# Patient Record
Sex: Female | Born: 1950
Health system: Southern US, Community
[De-identification: ages and names within clinical notes are randomized; demographics above are authoritative.]

## PROBLEM LIST (undated history)

## (undated) DIAGNOSIS — R112 Nausea with vomiting, unspecified: Secondary | ICD-10-CM

## (undated) DIAGNOSIS — G4700 Insomnia, unspecified: Secondary | ICD-10-CM

## (undated) DIAGNOSIS — E049 Nontoxic goiter, unspecified: Secondary | ICD-10-CM

## (undated) DIAGNOSIS — D649 Anemia, unspecified: Secondary | ICD-10-CM

## (undated) DIAGNOSIS — H409 Unspecified glaucoma: Secondary | ICD-10-CM

## (undated) DIAGNOSIS — K8020 Calculus of gallbladder without cholecystitis without obstruction: Secondary | ICD-10-CM

## (undated) DIAGNOSIS — A048 Other specified bacterial intestinal infections: Secondary | ICD-10-CM

## (undated) DIAGNOSIS — N6310 Unspecified lump in the right breast, unspecified quadrant: Secondary | ICD-10-CM

## (undated) DIAGNOSIS — L709 Acne, unspecified: Secondary | ICD-10-CM

## (undated) DIAGNOSIS — K219 Gastro-esophageal reflux disease without esophagitis: Secondary | ICD-10-CM

## (undated) DIAGNOSIS — E119 Type 2 diabetes mellitus without complications: Secondary | ICD-10-CM

## (undated) DIAGNOSIS — T8859XA Other complications of anesthesia, initial encounter: Secondary | ICD-10-CM

## (undated) DIAGNOSIS — M1990 Unspecified osteoarthritis, unspecified site: Secondary | ICD-10-CM

## (undated) DIAGNOSIS — H6992 Unspecified Eustachian tube disorder, left ear: Secondary | ICD-10-CM

## (undated) DIAGNOSIS — J189 Pneumonia, unspecified organism: Secondary | ICD-10-CM

## (undated) DIAGNOSIS — E7800 Pure hypercholesterolemia, unspecified: Secondary | ICD-10-CM

## (undated) DIAGNOSIS — R011 Cardiac murmur, unspecified: Secondary | ICD-10-CM

## (undated) DIAGNOSIS — K5790 Diverticulosis of intestine, part unspecified, without perforation or abscess without bleeding: Secondary | ICD-10-CM

## (undated) DIAGNOSIS — F32A Depression, unspecified: Secondary | ICD-10-CM

## (undated) DIAGNOSIS — E039 Hypothyroidism, unspecified: Secondary | ICD-10-CM

## (undated) DIAGNOSIS — H269 Unspecified cataract: Secondary | ICD-10-CM

## (undated) DIAGNOSIS — Z9889 Other specified postprocedural states: Secondary | ICD-10-CM

## (undated) DIAGNOSIS — I10 Essential (primary) hypertension: Secondary | ICD-10-CM

## (undated) DIAGNOSIS — F329 Major depressive disorder, single episode, unspecified: Secondary | ICD-10-CM

## (undated) DIAGNOSIS — T7840XA Allergy, unspecified, initial encounter: Secondary | ICD-10-CM

## (undated) HISTORY — DX: Major depressive disorder, single episode, unspecified: F32.9

## (undated) HISTORY — PX: BREAST LUMPECTOMY: SHX2

## (undated) HISTORY — PX: TYMPANOPLASTY: SHX33

## (undated) HISTORY — DX: Unspecified glaucoma: H40.9

## (undated) HISTORY — DX: Diverticulosis of intestine, part unspecified, without perforation or abscess without bleeding: K57.90

## (undated) HISTORY — PX: EYE SURGERY: SHX253

## (undated) HISTORY — DX: Unspecified lump in the right breast, unspecified quadrant: N63.10

## (undated) HISTORY — PX: OTHER SURGICAL HISTORY: SHX169

## (undated) HISTORY — PX: UPPER GI ENDOSCOPY: SHX6162

## (undated) HISTORY — DX: Acne, unspecified: L70.9

## (undated) HISTORY — DX: Allergy, unspecified, initial encounter: T78.40XA

## (undated) HISTORY — DX: Unspecified cataract: H26.9

## (undated) HISTORY — DX: Essential (primary) hypertension: I10

## (undated) HISTORY — DX: Calculus of gallbladder without cholecystitis without obstruction: K80.20

## (undated) HISTORY — DX: Hypothyroidism, unspecified: E03.9

## (undated) HISTORY — DX: Depression, unspecified: F32.A

## (undated) HISTORY — DX: Cardiac murmur, unspecified: R01.1

## (undated) HISTORY — PX: ABDOMINAL HYSTERECTOMY: SHX81

## (undated) HISTORY — DX: Other specified bacterial intestinal infections: A04.8

---

## 1976-10-09 HISTORY — PX: OTHER SURGICAL HISTORY: SHX169

## 1997-10-30 ENCOUNTER — Other Ambulatory Visit: Admission: RE | Admit: 1997-10-30 | Discharge: 1997-10-30 | Payer: Self-pay | Admitting: Obstetrics & Gynecology

## 2000-04-13 ENCOUNTER — Other Ambulatory Visit: Admission: RE | Admit: 2000-04-13 | Discharge: 2000-04-13 | Payer: Self-pay | Admitting: Obstetrics and Gynecology

## 2001-07-16 ENCOUNTER — Other Ambulatory Visit: Admission: RE | Admit: 2001-07-16 | Discharge: 2001-07-16 | Payer: Self-pay | Admitting: Obstetrics and Gynecology

## 2002-07-14 ENCOUNTER — Encounter: Payer: Self-pay | Admitting: Internal Medicine

## 2002-07-14 ENCOUNTER — Ambulatory Visit (HOSPITAL_COMMUNITY): Admission: RE | Admit: 2002-07-14 | Discharge: 2002-07-14 | Payer: Self-pay | Admitting: Internal Medicine

## 2002-08-08 ENCOUNTER — Encounter: Admission: RE | Admit: 2002-08-08 | Discharge: 2002-08-08 | Payer: Self-pay | Admitting: Obstetrics and Gynecology

## 2002-08-08 ENCOUNTER — Encounter: Payer: Self-pay | Admitting: Obstetrics and Gynecology

## 2004-03-15 ENCOUNTER — Encounter: Admission: RE | Admit: 2004-03-15 | Discharge: 2004-03-15 | Payer: Self-pay | Admitting: Obstetrics and Gynecology

## 2004-03-17 ENCOUNTER — Encounter: Admission: RE | Admit: 2004-03-17 | Discharge: 2004-03-17 | Payer: Self-pay | Admitting: Obstetrics and Gynecology

## 2005-03-14 ENCOUNTER — Ambulatory Visit: Payer: Self-pay | Admitting: Internal Medicine

## 2005-03-20 ENCOUNTER — Ambulatory Visit: Payer: Self-pay | Admitting: Internal Medicine

## 2005-04-20 ENCOUNTER — Ambulatory Visit: Payer: Self-pay | Admitting: Internal Medicine

## 2005-04-24 ENCOUNTER — Ambulatory Visit: Payer: Self-pay | Admitting: Internal Medicine

## 2005-04-25 ENCOUNTER — Ambulatory Visit (HOSPITAL_COMMUNITY): Admission: RE | Admit: 2005-04-25 | Discharge: 2005-04-25 | Payer: Self-pay | Admitting: Internal Medicine

## 2005-07-28 ENCOUNTER — Ambulatory Visit: Payer: Self-pay | Admitting: Internal Medicine

## 2006-06-21 ENCOUNTER — Ambulatory Visit: Payer: Self-pay | Admitting: Internal Medicine

## 2006-07-05 ENCOUNTER — Ambulatory Visit: Payer: Self-pay | Admitting: Internal Medicine

## 2006-07-12 ENCOUNTER — Ambulatory Visit: Payer: Self-pay | Admitting: Internal Medicine

## 2006-07-31 ENCOUNTER — Ambulatory Visit: Payer: Self-pay | Admitting: Internal Medicine

## 2006-07-31 LAB — CONVERTED CEMR LAB: Creatinine, Ser: 0.7 mg/dL (ref 0.4–1.2)

## 2006-08-02 ENCOUNTER — Ambulatory Visit: Payer: Self-pay | Admitting: Internal Medicine

## 2006-08-06 ENCOUNTER — Ambulatory Visit: Payer: Self-pay | Admitting: Gastroenterology

## 2006-08-20 ENCOUNTER — Ambulatory Visit: Payer: Self-pay | Admitting: Gastroenterology

## 2006-08-20 LAB — HM COLONOSCOPY

## 2007-09-04 ENCOUNTER — Encounter: Payer: Self-pay | Admitting: Internal Medicine

## 2007-09-04 DIAGNOSIS — E039 Hypothyroidism, unspecified: Secondary | ICD-10-CM | POA: Insufficient documentation

## 2007-09-04 DIAGNOSIS — F329 Major depressive disorder, single episode, unspecified: Secondary | ICD-10-CM | POA: Insufficient documentation

## 2007-09-04 DIAGNOSIS — I10 Essential (primary) hypertension: Secondary | ICD-10-CM | POA: Insufficient documentation

## 2008-01-30 ENCOUNTER — Ambulatory Visit: Payer: Self-pay | Admitting: Internal Medicine

## 2008-01-30 LAB — CONVERTED CEMR LAB
Albumin: 4.1 g/dL (ref 3.5–5.2)
BUN: 10 mg/dL (ref 6–23)
Basophils Absolute: 0 10*3/uL (ref 0.0–0.1)
CO2: 29 meq/L (ref 19–32)
Cholesterol: 173 mg/dL (ref 0–200)
Creatinine, Ser: 0.7 mg/dL (ref 0.4–1.2)
Eosinophils Absolute: 0.1 10*3/uL (ref 0.0–0.7)
GFR calc Af Amer: 111 mL/min
GFR calc non Af Amer: 91 mL/min
HCT: 42.2 % (ref 36.0–46.0)
HDL: 42.6 mg/dL (ref >39.0)
Hemoglobin: 14.4 g/dL (ref 12.0–15.0)
Lymphocytes Relative: 49.1 % — ABNORMAL HIGH (ref 12.0–46.0)
MCHC: 34.1 g/dL (ref 30.0–36.0)
MCV: 86.3 fL (ref 78.0–100.0)
Monocytes Absolute: 0.5 10*3/uL (ref 0.1–1.0)
Monocytes Relative: 10.6 % (ref 3.0–12.0)
Mucus, UA: NEGATIVE
Neutro Abs: 1.7 10*3/uL (ref 1.4–7.7)
Neutrophils Relative %: 38.4 % — ABNORMAL LOW (ref 43.0–77.0)
Potassium: 4 meq/L (ref 3.5–5.1)
RBC: 4.89 M/uL (ref 3.87–5.11)
Sodium: 139 meq/L (ref 135–145)
Total Bilirubin: 0.8 mg/dL (ref 0.3–1.2)
Total CHOL/HDL Ratio: 4.1
Total Protein: 7.6 g/dL (ref 6.0–8.3)
Triglycerides: 93 mg/dL (ref 0–149)
Urobilinogen, UA: 0.2 (ref 0.0–1.0)
pH: 6.5 (ref 5.0–8.0)

## 2008-02-06 ENCOUNTER — Ambulatory Visit: Payer: Self-pay | Admitting: Internal Medicine

## 2008-06-26 ENCOUNTER — Ambulatory Visit: Payer: Self-pay | Admitting: Internal Medicine

## 2008-06-26 ENCOUNTER — Telehealth (INDEPENDENT_AMBULATORY_CARE_PROVIDER_SITE_OTHER): Payer: Self-pay | Admitting: *Deleted

## 2008-06-27 ENCOUNTER — Encounter: Payer: Self-pay | Admitting: Internal Medicine

## 2008-06-29 LAB — CONVERTED CEMR LAB
BUN: 5 mg/dL — ABNORMAL LOW (ref 6–23)
Bilirubin Urine: NEGATIVE
Bilirubin, Direct: 0.3 mg/dL (ref 0.0–0.3)
Chloride: 103 meq/L (ref 96–112)
Creatinine, Ser: 0.7 mg/dL (ref 0.4–1.2)
Eosinophils Relative: 0.6 % (ref 0.0–5.0)
GFR calc non Af Amer: 91 mL/min
Ketones, ur: NEGATIVE mg/dL
Lipase: 14 units/L (ref 11.0–59.0)
Lymphocytes Relative: 26.5 % (ref 12.0–46.0)
MCHC: 34.6 g/dL (ref 30.0–36.0)
MCV: 87.2 fL (ref 78.0–100.0)
Neutro Abs: 5.3 10*3/uL (ref 1.4–7.7)
Neutrophils Relative %: 66.3 % (ref 43.0–77.0)
Nitrite: NEGATIVE
Potassium: 3.1 meq/L — ABNORMAL LOW (ref 3.5–5.1)
RDW: 12.1 % (ref 11.5–14.6)
Sodium: 141 meq/L (ref 135–145)
Total Bilirubin: 1.3 mg/dL — ABNORMAL HIGH (ref 0.3–1.2)
Total Protein, Urine: NEGATIVE mg/dL
WBC: 7.9 10*3/uL (ref 4.5–10.5)
pH: 6.5 (ref 5.0–8.0)

## 2008-07-15 ENCOUNTER — Telehealth: Payer: Self-pay | Admitting: Internal Medicine

## 2008-07-15 ENCOUNTER — Ambulatory Visit: Payer: Self-pay | Admitting: Internal Medicine

## 2009-01-25 ENCOUNTER — Telehealth: Payer: Self-pay | Admitting: Internal Medicine

## 2009-01-25 ENCOUNTER — Encounter: Payer: Self-pay | Admitting: Internal Medicine

## 2009-07-20 ENCOUNTER — Emergency Department (HOSPITAL_BASED_OUTPATIENT_CLINIC_OR_DEPARTMENT_OTHER): Admission: EM | Admit: 2009-07-20 | Discharge: 2009-07-20 | Payer: Self-pay | Admitting: Emergency Medicine

## 2009-07-20 ENCOUNTER — Ambulatory Visit: Payer: Self-pay | Admitting: Diagnostic Radiology

## 2009-07-21 ENCOUNTER — Telehealth: Payer: Self-pay | Admitting: Internal Medicine

## 2009-08-11 ENCOUNTER — Emergency Department (HOSPITAL_COMMUNITY): Admission: EM | Admit: 2009-08-11 | Discharge: 2009-08-11 | Payer: Self-pay | Admitting: Emergency Medicine

## 2009-08-18 ENCOUNTER — Ambulatory Visit: Payer: Self-pay | Admitting: Internal Medicine

## 2009-09-27 ENCOUNTER — Encounter: Payer: Self-pay | Admitting: Internal Medicine

## 2009-09-27 LAB — HM MAMMOGRAPHY: HM Mammogram: NORMAL

## 2009-10-04 ENCOUNTER — Ambulatory Visit: Payer: Self-pay | Admitting: Internal Medicine

## 2009-10-04 LAB — CONVERTED CEMR LAB
AST: 19 units/L (ref 0–37)
Alkaline Phosphatase: 78 units/L (ref 39–117)
Basophils Absolute: 0 10*3/uL (ref 0.0–0.1)
Bilirubin, Direct: 0.1 mg/dL (ref 0.0–0.3)
CO2: 27 meq/L (ref 19–32)
Chloride: 101 meq/L (ref 96–112)
Eosinophils Relative: 2 % (ref 0.0–5.0)
Glucose, Bld: 105 mg/dL — ABNORMAL HIGH (ref 70–99)
HDL: 47.5 mg/dL (ref >39.00)
Ketones, ur: NEGATIVE mg/dL
LDL Cholesterol: 116 mg/dL — ABNORMAL HIGH (ref 0–99)
Leukocytes, UA: NEGATIVE
Lymphocytes Relative: 56 % — ABNORMAL HIGH (ref 12.0–46.0)
Monocytes Relative: 8.9 % (ref 3.0–12.0)
Neutrophils Relative %: 32.5 % — ABNORMAL LOW (ref 43.0–77.0)
Platelets: 229 10*3/uL (ref 150.0–400.0)
RDW: 12.2 % (ref 11.5–14.6)
Sodium: 136 meq/L (ref 135–145)
Specific Gravity, Urine: 1.01 (ref 1.000–1.030)
TSH: 3.56 microintl units/mL (ref 0.35–5.50)
Total Bilirubin: 1.1 mg/dL (ref 0.3–1.2)
Total CHOL/HDL Ratio: 4
Total Protein, Urine: NEGATIVE mg/dL
Triglycerides: 86 mg/dL (ref 0.0–149.0)
WBC: 5 10*3/uL (ref 4.5–10.5)
pH: 5.5 (ref 5.0–8.0)

## 2009-10-14 ENCOUNTER — Ambulatory Visit: Payer: Self-pay | Admitting: Internal Medicine

## 2009-10-15 ENCOUNTER — Telehealth: Payer: Self-pay | Admitting: Internal Medicine

## 2010-01-12 ENCOUNTER — Telehealth: Payer: Self-pay | Admitting: Internal Medicine

## 2010-01-13 ENCOUNTER — Ambulatory Visit: Payer: Self-pay | Admitting: Internal Medicine

## 2010-01-13 LAB — CONVERTED CEMR LAB
ALT: 16 units/L (ref 0–35)
AST: 14 units/L (ref 0–37)
Albumin: 4.2 g/dL (ref 3.5–5.2)
Chloride: 102 meq/L (ref 96–112)
Eosinophils Relative: 0.4 % (ref 0.0–5.0)
GFR calc non Af Amer: 77.7 mL/min (ref >60)
Glucose, Bld: 129 mg/dL — ABNORMAL HIGH (ref 70–99)
HCT: 39.7 % (ref 36.0–46.0)
Hemoglobin: 13.7 g/dL (ref 12.0–15.0)
Lymphs Abs: 2 10*3/uL (ref 0.7–4.0)
Monocytes Relative: 6.8 % (ref 3.0–12.0)
Neutro Abs: 6.8 10*3/uL (ref 1.4–7.7)
Potassium: 3.6 meq/L (ref 3.5–5.1)
RDW: 12.9 % (ref 11.5–14.6)
Sodium: 139 meq/L (ref 135–145)
TSH: 2.72 microintl units/mL (ref 0.35–5.50)
Total Protein, Urine: NEGATIVE mg/dL
Urine Glucose: NEGATIVE mg/dL
WBC: 9.5 10*3/uL (ref 4.5–10.5)

## 2010-01-14 ENCOUNTER — Telehealth: Payer: Self-pay | Admitting: Internal Medicine

## 2010-01-16 ENCOUNTER — Inpatient Hospital Stay (HOSPITAL_COMMUNITY): Admission: EM | Admit: 2010-01-16 | Discharge: 2010-01-19 | Payer: Self-pay | Admitting: Emergency Medicine

## 2010-01-17 ENCOUNTER — Ambulatory Visit: Payer: Self-pay | Admitting: Internal Medicine

## 2010-01-17 ENCOUNTER — Telehealth: Payer: Self-pay | Admitting: Internal Medicine

## 2010-01-19 ENCOUNTER — Telehealth (INDEPENDENT_AMBULATORY_CARE_PROVIDER_SITE_OTHER): Payer: Self-pay | Admitting: *Deleted

## 2010-01-26 ENCOUNTER — Ambulatory Visit: Payer: Self-pay | Admitting: Internal Medicine

## 2010-02-24 ENCOUNTER — Telehealth: Payer: Self-pay | Admitting: Internal Medicine

## 2010-05-06 ENCOUNTER — Telehealth: Payer: Self-pay | Admitting: Internal Medicine

## 2010-09-05 ENCOUNTER — Ambulatory Visit: Payer: Self-pay | Admitting: Internal Medicine

## 2010-09-05 DIAGNOSIS — K219 Gastro-esophageal reflux disease without esophagitis: Secondary | ICD-10-CM | POA: Insufficient documentation

## 2010-10-30 ENCOUNTER — Encounter: Payer: Self-pay | Admitting: Obstetrics and Gynecology

## 2010-11-08 NOTE — Progress Notes (Signed)
  Phone Note Refill Request Message from:  Fax from Pharmacy on May 06, 2010 9:08 AM  Refills Requested: Medication #1:  SYNTHROID 112 MCG  TABS 1 by mouth once daily / Brand Medically Necessary [BMN] Initial call taken by: Ami Bullins CMA,  May 06, 2010 9:08 AM    Prescriptions: SYNTHROID 112 MCG  TABS (LEVOTHYROXINE SODIUM) 1 by mouth once daily / Brand Medically Necessary Brand medically necessary #90 x 3   Entered by:   Ami Bullins CMA   Authorized by:   Jacques Navy MD   Signed by:   Bill Salinas CMA on 05/06/2010   Method used:   Faxed to ...       [facility] (retail)       [address]       [place], [place]  [address]       Ph: [phone]       Fax: [phone]   RxID:   [id number]

## 2010-11-08 NOTE — Progress Notes (Signed)
Summary: Call Report  Phone Note Other Incoming   Caller: [facility] Call Report Summary of Call: [facility] Triage Call Report Triage Record Num: [id number] Operator: Josephina Gip Patient Name: Sonya Brady Call Date & Time: 01/16/2010 2:30:31PM Patient Phone: [phone] PCP: Illene Regulus Patient Gender: Female PCP Fax : [phone] Patient DOB: [birth year] Practice Name: [facility] Reason for Call: Pt states" she is having abd pain and dark stools which started on 01/15/10. Was seen by MD on 01/13/10 for abd pain and started on cipro. Was called by nurse on 01/14/10 and told that blood in urine and possible gall stone. Pain is over entire abd.now and she has had 3 stools that are dark in color. Pt states she ususally runs a fever at nighttime. Instructed pt to go to [facility]. Daughter is going to drive her. Protocol(s) Used: Diarrhea / Change in Bowel Habits Protocol(s) Used: GI Bleeding Recommended Outcome per Protocol: See Provider within 4 hours Reason for Outcome: Rectal bleeding (blood in or on the stool; more than scant) One or more episodes of rectal bleeding (more than scant) and no symptoms of hypovolemia Care Advice:  ~ Limit activities and increase periods of rest. May drink clear liquids (such as water, cola or other soda, tea) but do not eat solid foods prior to being seen by provider.  ~  ~ Do not change prescribed medications or dosing regimen until provider is consulted.  ~ List, or take, all current prescription(s), OTC or alternative medication(s) to provider for evaluation.  ~ DO NOT drive until consulting with provider. Call provider immediately if symptoms of hypovolemia develop including: pulse more than 100/minute, lightheaded or dizzy when rising from sitting/reclining position, shortness of breath, weakness with exertion, angina, or paleness.  ~  ~ CAUTIONS 04 Initial call taken by: Margaret Pyle, CMA,  January 17, 2010 8:17  AM

## 2010-11-08 NOTE — Assessment & Plan Note (Signed)
Summary: acid reflux/heartburn/cd   Vital Signs:  Patient profile:   60 year old female Height:      65 inches Weight:      170 pounds BMI:     28.39 O2 Sat:      98 % on Room air Temp:     98.6 degrees F oral Pulse rate:   92 / minute BP sitting:   110 / 80  (left arm) Cuff size:   regular  Vitals Entered By: Bill Salinas CMA (September 05, 2010 9:43 AM)  O2 Flow:  Room air CC: pt here with c/o heart burn with burning in her throat x 5 days. She has started OTC acid reducer with little relief/ ab   Primary Care Provider:  Arlena Marsan  CC:  pt here with c/o heart burn with burning in her throat x 5 days. She has started OTC acid reducer with little relief/ ab.  History of Present Illness: Started having LLQ pain last week. She has been waiting. she has pain every time she takes a deep breath. No fever. No change in BM., no hematemesis. No constant pain in LLQ  She is also c/o reflux with burning in the esophagus. She took acid reducer which may have helped. She has been afraid to eat. No N/V, no hemetemesis, no dysphagia.  Current Medications (verified): 1)  Synthroid 112 Mcg  Tabs (Levothyroxine Sodium) .Marland Kitchen.. 1 By Mouth Once Daily / Brand Medically Necessary 2)  Lisinopril-Hydrochlorothiazide 20-12.5 Mg Tabs (Lisinopril-Hydrochlorothiazide) .Marland Kitchen.. 1 By Mouth Once Daily 3)  Simvastatin 40 Mg Tabs (Simvastatin) .Marland Kitchen.. 1 By Mouth Once Daily 4)  Metronidazole 500 Mg Tabs (Metronidazole) .... One By Mouth Three Times A Day For 10 Days 5)  Ra Acid Reducer Max St 20 Mg Tabs (Famotidine) .Marland Kitchen.. 1 Tablet 30 Minutes Before Meals  Allergies (verified): No Known Drug Allergies  Past History:  Past Medical History: Last updated: 09/04/2007 Depression Hypertension Hypothyroidism * ACNE BREAST MASS, RIGHT (ICD-611.72)  Past Surgical History: Last updated: 06/26/2008 * TYMPANOPLASTY - tube left ear Hysterectomy (both ovary intack)  Family History: Last updated: 02-27-08 father -  deceased 73's: unknown causes mother -deceased @82 : SBO, HTN Neg- breast or colon cancer sister & brother with thyroid  Social History: Last updated: 07/15/2008 HSG married '72 - widowed '97 1 daughter -'96, 1 son '89: son going thru divorce ('09) applying to med school Work: bookkeeper [employer]  Physical Exam  General:  overweight woman in no distress Head:  normocephalic and atraumatic.   Eyes:  C&S clear without scleral icterus Lungs:  normal respiratory effort, normal breath sounds, no crackles, and no wheezes.   Heart:  normal rate and regular rhythm.   Abdomen:  soft, normal bowel sounds, no guarding, no rigidity, and no hepatomegaly.  Mild tenderness at hte left lower quadrant. Minimal tenderness in the epigastrum Msk:  normal ROM, no joint tenderness, no joint swelling, and no joint warmth.   Pulses:  2+ radial Skin:  turgor normal, color normal, and no rashes.   Psych:  Oriented X3, memory intact for recent and remote, normally interactive, and good eye contact.     Impression & Recommendations:  Problem # 1:  GERD (ICD-530.81) Byhistory patient with GERD. Not really tender to palpation.  Plan - PPI therapy - nexium 40mg  once daily x 1 month then may switch to otc priolosec as needed.  Her updated medication list for this problem includes:    Ra Acid Reducer Max St 20 Mg Tabs (  Famotidine) .Marland Kitchen... 1 tablet 30 minutes before meals    Nexium 40 Mg Cpdr (Esomeprazole magnesium) .Marland Kitchen... 1 by mouth once daily x 1 month for gerd  Problem # 2:  ABDOMINAL PAIN, LOWER (ICD-789.09) No evdidence to support diagnosis of diverticulitis.   Plan - wholesome diet          watch for fever and persistent pain.   Complete Medication List: 1)  Synthroid 112 Mcg Tabs (Levothyroxine sodium) .Marland Kitchen.. 1 by mouth once daily / brand medically necessary 2)  Lisinopril-hydrochlorothiazide 20-12.5 Mg Tabs (Lisinopril-hydrochlorothiazide) .Marland Kitchen.. 1 by mouth once daily 3)  Simvastatin 40 Mg Tabs  (Simvastatin) .Marland Kitchen.. 1 by mouth once daily 4)  Metronidazole 500 Mg Tabs (Metronidazole) .... One by mouth three times a day for 10 days 5)  Ra Acid Reducer Max St 20 Mg Tabs (Famotidine) .Marland Kitchen.. 1 tablet 30 minutes before meals 6)  Nexium 40 Mg Cpdr (Esomeprazole magnesium) .Marland Kitchen.. 1 by mouth once daily x 1 month for gerd  Patient Instructions: 1)  Reflux - seems by symptoms to be acid reflux. No severe tenderness on exam. Plan - Nexium 40mg  every AM for 1 month, then may take over the counter Prilosec as needed. No dietary restrictions. 2)  Lower abdominal pain - no fever, no severe tenderness to suggest diverticulitis. Also lungs are clear. Plan - pay attention to symptoms. Call for fever, change in bowel habit, worsening pain.  Prescriptions: NEXIUM 40 MG CPDR (ESOMEPRAZOLE MAGNESIUM) 1 by mouth once daily x 1 month for GERD  #30 x 1   Entered and Authorized by:   Jacques Navy MD   Signed by:   Jacques Navy MD on 09/05/2010   Method used:   Electronically to        [facility]  [phone]* (retail)       [address] [place], [place]  [address]       Ph: [phone] or [phone]       Fax: [phone]   RxID:   [id number]    Orders Added: 1)  Est. Patient Level III [[address]644]

## 2010-11-08 NOTE — Assessment & Plan Note (Signed)
Summary: dr men pt--abd pain--fever  -sdr--stc   Vital Signs:  Patient profile:   60 year old female Height:      65 inches (165.10 cm) Weight:      175.50 pounds (79.77 kg) BMI:     29.31 O2 Sat:      97 % on Room air Temp:     98.5 degrees F (36.94 degrees C) oral Pulse rate:   91 / minute Pulse rhythm:   regular Resp:     16 per minute BP sitting:   128 / 68  (left arm) Cuff size:   regular  Vitals Entered By: Brenton Grills (January 13, 2010 8:45 AM)  Nutrition Counseling: Patient's BMI is greater than 25 and therefore counseled on weight management options.  O2 Flow:  Room air CC: pt c/o lower abdominal pain/fever since tuesday. Pt states she had fever of 100.9 last night and also experienced chills/pt has been taking tylenol since tuesday and took magnesium citrate yesterday/aj, Abdominal pain   Primary Care Provider:  Norins  CC:  pt c/o lower abdominal pain/fever since tuesday. Pt states she had fever of 100.9 last night and also experienced chills/pt has been taking tylenol since tuesday and took magnesium citrate yesterday/aj and Abdominal pain.  History of Present Illness:  Abdominal Pain      This is a 59 year old woman who presents with Abdominal pain.  The symptoms began 2 days ago.  On a scale of 1 to 10, the intensity is described as a 4.  The patient denies nausea, vomiting, diarrhea, constipation, melena, hematochezia, anorexia, and hematemesis.  The location of the pain is right lower quadrant and left lower quadrant.  The pain is described as constant and dull.  Associated symptoms include fever.  The patient denies the following symptoms: weight loss, dysuria, chest pain, jaundice, dark urine, and vaginal bleeding.  The pain is worse with movement.    Clinical Review Panels:  Prevention   Last Mammogram:  normal bilateral (09/27/2009)   Last Pap Smear:  normal (10/05/2009)   Last Colonoscopy:  Done (08/20/2006)   Current Medications (verified): 1)   Synthroid 112 Mcg  Tabs (Levothyroxine Sodium) .Marland Kitchen.. 1 By Mouth Once Daily / Brand Medically Necessary 2)  Lisinopril-Hydrochlorothiazide 20-12.5 Mg Tabs (Lisinopril-Hydrochlorothiazide) .Marland Kitchen.. 1 By Mouth Once Daily 3)  Lipitor 20 Mg  Tabs (Atorvastatin Calcium) .... Once Daily  Allergies (verified): No Known Drug Allergies  Past History:  Past Medical History: Reviewed history from 09/04/2007 and no changes required. Depression Hypertension Hypothyroidism * ACNE BREAST MASS, RIGHT (ICD-611.72)  Past Surgical History: Reviewed history from 06/26/2008 and no changes required. * TYMPANOPLASTY - tube left ear Hysterectomy (both ovary intack)  Family History: Reviewed history from 02/06/2008 and no changes required. father - deceased 29's: unknown causes mother -deceased @82 : SBO, HTN Neg- breast or colon cancer sister & brother with thyroid  Social History: Reviewed history from 07/15/2008 and no changes required. HSG married '72 - widowed '97 1 daughter -'53, 1 son '100: son going thru divorce ('09) applying to med school Work: bookkeeper [employer]  Review of Systems       The patient complains of fever and abdominal pain.  The patient denies anorexia, weight loss, weight gain, chest pain, syncope, dyspnea on exertion, peripheral edema, prolonged cough, headaches, hemoptysis, melena, hematochezia, severe indigestion/heartburn, hematuria, suspicious skin lesions, enlarged lymph nodes, and angioedema.   GI:  Complains of abdominal pain and loss of appetite; denies bloody stools, change  in bowel habits, constipation, dark tarry stools, diarrhea, excessive appetite, indigestion, nausea, vomiting, vomiting blood, and yellowish skin color. Endo:  Denies cold intolerance, excessive hunger, excessive thirst, excessive urination, heat intolerance, polyuria, and weight change.  Physical Exam  General:  alert, well-developed, well-nourished, well-hydrated, appropriate dress, normal  appearance, healthy-appearing, cooperative to examination, and good hygiene.   Head:  normocephalic, atraumatic, no abnormalities observed, and no abnormalities palpated.   Eyes:  pink moist mm., no icterus Mouth:  Oral mucosa and oropharynx without lesions or exudates.  Teeth in good repair. Neck:  supple, full ROM, no masses, no thyromegaly, no JVD, normal carotid upstroke, no carotid bruits, and no cervical lymphadenopathy.   Lungs:  normal respiratory effort, no intercostal retractions, no accessory muscle use, normal breath sounds, no dullness, no fremitus, no crackles, and no wheezes.   Heart:  normal rate, regular rhythm, no murmur, no gallop, no rub, and no JVD.   Abdomen:  soft, normal bowel sounds, no distention, no masses, no guarding, no rigidity, no rebound tenderness, no abdominal hernia, no inguinal hernia, no hepatomegaly, no splenomegaly, RLQ tenderness, and LLQ tenderness.   Rectal:  No external abnormalities noted. Normal sphincter tone. No rectal masses or tenderness. heme neg. stool. Msk:  normal ROM, no joint tenderness, no joint swelling, no joint warmth, no redness over joints, no joint deformities, no joint instability, and no crepitation.   Pulses:  R and L carotid,radial,femoral,dorsalis pedis and posterior tibial pulses are full and equal bilaterally Extremities:  No clubbing, cyanosis, edema, or deformity noted with normal full range of motion of all joints.   Neurologic:  No cranial nerve deficits noted. Station and gait are normal. Plantar reflexes are down-going bilaterally. DTRs are symmetrical throughout. Sensory, motor and coordinative functions appear intact. Skin:  turgor normal, color normal, no rashes, no suspicious lesions, no ecchymoses, no petechiae, no purpura, no ulcerations, and no edema.   Cervical Nodes:  no anterior cervical adenopathy and no posterior cervical adenopathy.   Axillary Nodes:  no R axillary adenopathy and no L axillary adenopathy.     Inguinal Nodes:  no R inguinal adenopathy and no L inguinal adenopathy.   Psych:  Cognition and judgment appear intact. Alert and cooperative with normal attention span and concentration. No apparent delusions, illusions, hallucinations   Impression & Recommendations:  Problem # 1:  ABDOMINAL PAIN, LOWER (ICD-789.09) Assessment New  this sounds like diverticulitis, I don't see her 2007 colonoscopy report in EMR and old chart is not here today, will order labs to look for other organic illness/uti/pancreatitis and start empiric antibiotic therapy with cipro and flagyl.  Discussed use of medications, application of heat or cold, and exercises.   Orders: Venipuncture (72536) T-Abdomen 2-view (74020TC) TLB-BMP (Basic Metabolic Panel-BMET) (80048-METABOL) TLB-CBC Platelet - w/Differential (85025-CBCD) TLB-Hepatic/Liver Function Pnl (80076-HEPATIC) TLB-TSH (Thyroid Stimulating Hormone) (84443-TSH) TLB-Amylase (82150-AMYL) TLB-Lipase (83690-LIPASE) TLB-Udip w/ Micro (81001-URINE) Hemoccult Guaiac-1 spec.(in office) (82270)  Problem # 2:  HYPOTHYROIDISM (ICD-244.9) Assessment: Unchanged  Her updated medication list for this problem includes:    Synthroid 112 Mcg Tabs (Levothyroxine sodium) .Marland Kitchen... 1 by mouth once daily / brand medically necessary  Orders: Venipuncture (64403) TLB-BMP (Basic Metabolic Panel-BMET) (80048-METABOL) TLB-CBC Platelet - w/Differential (85025-CBCD) TLB-Hepatic/Liver Function Pnl (80076-HEPATIC) TLB-TSH (Thyroid Stimulating Hormone) (84443-TSH) TLB-Amylase (82150-AMYL) TLB-Lipase (83690-LIPASE) TLB-Udip w/ Micro (81001-URINE)  Complete Medication List: 1)  Synthroid 112 Mcg Tabs (Levothyroxine sodium) .Marland Kitchen.. 1 by mouth once daily / brand medically necessary 2)  Lisinopril-hydrochlorothiazide 20-12.5 Mg Tabs (Lisinopril-hydrochlorothiazide) .Marland Kitchen.. 1 by  mouth once daily 3)  Lipitor 20 Mg Tabs (Atorvastatin calcium) .... Once daily 4)  Cipro 500 Mg Tab  (Ciprofloxacin hcl) .... Take 1 tablet by mouth morning and night x 10 days 5)  Metronidazole 500 Mg Tabs (Metronidazole) .... One by mouth three times a day for 10 days  Patient Instructions: 1)  Please schedule a follow-up appointment in 2 weeks. 2)  Take your antibiotic as prescribed until ALL of it is gone, but stop if you develop a rash or swelling and contact our office as soon as possible. Prescriptions: METRONIDAZOLE 500 MG TABS (METRONIDAZOLE) One by mouth three times a day for 10 days  #30 x 1   Entered and Authorized by:   Etta Grandchild MD   Signed by:   Etta Grandchild MD on 01/13/2010   Method used:   Print then Give to Patient   RxID:   [id number] CIPRO 500 MG TAB (CIPROFLOXACIN HCL) Take 1 tablet by mouth morning and night X 10 days  #20 x 1   Entered and Authorized by:   Etta Grandchild MD   Signed by:   Etta Grandchild MD on 01/13/2010   Method used:   Print then Give to Patient   RxID:   [id number]

## 2010-11-08 NOTE — Progress Notes (Signed)
Summary: colon inflammation  Phone Note Call from Patient Call back at Home Phone [phone]   Summary of Call: Patient left message on triage that she thinks she has colon inflammation. Patient c/o abd pain and painful BM. Please advise. Initial call taken by: Lucious Groves,  January 12, 2010 4:05 PM  Follow-up for Phone Call        if no fever or diarrhea ,?  more likely constipation?   If no fever or diarrhea, n/v  - ok to try miralax OTC 17 gm by mouth daily, also consider colace 100 mg two times a day as needed for stool softner Follow-up by: Corwin Levins MD,  January 12, 2010 4:44 PM  Additional Follow-up for Phone Call Additional follow up Details #1::        left message on machine for pt to return my call. Margaret Pyle, CMA  January 12, 2010 4:50 PM     Additional Follow-up for Phone Call Additional follow up Details #2::    pt informed Follow-up by: Margaret Pyle, CMA,  January 13, 2010 8:57 AM

## 2010-11-08 NOTE — Assessment & Plan Note (Signed)
Summary: CPX/ NWS  #   Vital Signs:  Patient profile:   60 year old female Height:      65 inches Weight:      180 pounds BMI:     30.06 O2 Sat:      97 % on Room air Temp:     97.8 degrees F oral Pulse rate:   100 / minute BP sitting:   132 / 78  (left arm) Cuff size:   regular  Vitals Entered By: Bill Salinas CMA (October 14, 2009 1:39 PM)  O2 Flow:  Room air CC: pt here for wellness check, pt had a pap on 10/05/2009 with Dr Ambrose Mantle with results still pending/ ab   Primary Care Provider:  Devine Dant  CC:  pt here for wellness check and pt had a pap on 10/05/2009 with Dr Ambrose Mantle with results still pending/ ab.  History of Present Illness: Patient presents for routine medical exam. She has been doing well.   The parrot bite to her upper left lip has healed very nicely.   She did have a foot injury after a trip to [place] - she was seen at [facility] - negative x-rays. She is now bearing weight and has no problems.   She is current with Dr. Ambrose Mantle for gyn exam.   Current Medications (verified): 1)  Synthroid 112 Mcg  Tabs (Levothyroxine Sodium) .Marland Kitchen.. 1 By Mouth Once Daily 2)  Lisinopril-Hydrochlorothiazide 20-12.5 Mg Tabs (Lisinopril-Hydrochlorothiazide) .Marland Kitchen.. 1 By Mouth Once Daily 3)  Lipitor 20 Mg  Tabs (Atorvastatin Calcium) .... Once Daily  Allergies (verified): No Known Drug Allergies  Past History:  Family History: Last updated: 02/18/08 father - deceased 40's: unknown causes mother -deceased @82 : SBO, HTN Neg- breast or colon cancer sister & brother with thyroid  Social History: Last updated: 07/15/2008 HSG married '72 - widowed '97 1 daughter -'3, 1 son '22: son going thru divorce ('09) applying to med school Work: bookkeeper [employer]  Risk Factors: Caffeine Use: 1 (18-Feb-2008) Exercise: yes (2008/02/18)  Risk Factors: Smoking Status: never (09/04/2007)  Past Medical History: Reviewed history from 09/04/2007 and no changes  required. Depression Hypertension Hypothyroidism * ACNE BREAST MASS, RIGHT (ICD-611.72)  Past Surgical History: Reviewed history from 06/26/2008 and no changes required. * TYMPANOPLASTY - tube left ear Hysterectomy (both ovary intack)  Review of Systems       The patient complains of weight gain.  The patient denies anorexia, fever, weight loss, hoarseness, chest pain, syncope, dyspnea on exertion, abdominal pain, severe indigestion/heartburn, incontinence, transient blindness, unusual weight change, enlarged lymph nodes, and angioedema.    Physical Exam  General:  Well-developed,well-nourished,in no acute distress; alert,appropriate and cooperative throughout examination Head:  Normocephalic and atraumatic without obvious abnormalities. No apparent alopecia or balding. Eyes:  C&S clear, PERRLA, EOMI, could not visualize the fundi Ears:  External ear exam shows no significant lesions or deformities.  Otoscopic examination reveals clear canals, tympanic membranes are intact bilaterally without bulging, retraction, inflammation or discharge. Tympanostomy tube in the left TM Hearing is grossly normal bilaterally. Nose:  External nasal examination shows no deformity or inflammation. Nasal mucosa are pink and moist without lesions or exudates. Mouth:  Oral mucosa and oropharynx without lesions or exudates.  Teeth in good repair. Neck:  supple, full ROM, no thyromegaly, and no carotid bruits.   Chest Wall:  No deformities, masses, or tenderness noted. Breasts:  deferred to gyn Lungs:  normal respiratory effort, normal breath sounds, no crackles, and no wheezes.  Heart:  Normal rate and regular rhythm. S1 and S2 normal without gallop, murmur, click, rub or other extra sounds. Abdomen:  soft, non-tender, normal bowel sounds, no guarding, no rigidity, and no hepatomegaly.   Genitalia:  deferred to gynecology Msk:  normal ROM, no joint tenderness, no joint swelling, no joint warmth, no joint  deformities, no joint instability, and no crepitation.   Pulses:  2+ radial and DP pulses Extremities:  No clubbing, cyanosis, edema, or deformity noted with normal full range of motion of all joints.   Neurologic:  No cranial nerve deficits noted. Station and gait are normal. Plantar reflexes are down-going bilaterally. DTRs are symmetrical throughout. Sensory, motor and coordinative functions appear intact. Skin:  turgor normal, color normal, no rashes, no ecchymoses, no purpura, and no ulcerations.  Parrot bite well healed with vermillion border in good alignment, small nodular scar Cervical Nodes:  no anterior cervical adenopathy and no posterior cervical adenopathy.   Psych:  Oriented X3, memory intact for recent and remote, normally interactive, good eye contact, and not anxious appearing.     Impression & Recommendations:  Problem # 1:  HYPOTHYROIDISM (ICD-244.9) TSH is in normal range.  Plan - continue present medication dosage.  Her updated medication list for this problem includes:    Synthroid 112 Mcg Tabs (Levothyroxine sodium) .Marland Kitchen... 1 by mouth once daily  Problem # 2:  HYPERTENSION (ICD-401.9)  Her updated medication list for this problem includes:    Lisinopril-hydrochlorothiazide 20-12.5 Mg Tabs (Lisinopril-hydrochlorothiazide) .Marland Kitchen... 1 by mouth once daily  BP today: 132/78 Prior BP: 142/84 (08/18/2009)  Adequate control. Continue present medication.  Problem # 3:  Preventive Health Care (ICD-V70.0) Unremarkable history, Normal physical exam. Lab results are fine. She is current with gyn and breast cancer screening. Current with colorectal cancer screening with last colonoscopy '07. She is current with tetnus and flu immunization.  In summary - a very nice woman who is medical stable and doing well. Refill Rx's provided. She will return as needed or 1 year.   Complete Medication List: 1)  Synthroid 112 Mcg Tabs (Levothyroxine sodium) .Marland Kitchen.. 1 by mouth once daily 2)   Lisinopril-hydrochlorothiazide 20-12.5 Mg Tabs (Lisinopril-hydrochlorothiazide) .Marland Kitchen.. 1 by mouth once daily 3)  Lipitor 20 Mg Tabs (Atorvastatin calcium) .... Once daily  Patient: Sonya Brady Note: All result statuses are Final unless otherwise noted.  Tests: (1) BMP (METABOL)   Sodium                    136 mEq/L                   135-145   Potassium                 3.7 mEq/L                   3.5-5.1   Chloride                  101 mEq/L                   96-112   Carbon Dioxide            27 mEq/L                    19-32   Glucose              [H]  105 mg/dL  70-99   BUN                       10 mg/dL                    6-21   Creatinine                0.6 mg/dL                   3.0-8.6   Calcium                   9.4 mg/dL                   5.7-84.6   GFR                       108.39 mL/min               >60  Tests: (2) Lipid Panel (LIPID)   Cholesterol               181 mg/dL                   9-629     ATP III Classification            Desirable:  < 200 mg/dL                    Borderline High:  200 - 239 mg/dL               High:  > = 240 mg/dL   Triglycerides             86.0 mg/dL                  5.2-841.3     Normal:  <150 mg/dL     Borderline High:  244 - 199 mg/dL   HDL                       01.02 mg/dL                 >72.53   VLDL Cholesterol          17.2 mg/dL                  6.6-44.0   LDL Cholesterol      [H]  347 mg/dL                   4-25  CHO/HDL Ratio:  CHD Risk                             4                    Men          Women     1/2 Average Risk     3.4          3.3     Average Risk          5.0          4.4     2X Average Risk          9.6          7.1     3X Average Risk  15.0          11.0                           Tests: (3) CBC Platelet w/Diff (CBCD)   White Cell Count          5.0 K/uL                    4.5-10.5   Red Cell Count            4.73 Mil/uL                 3.87-5.11   Hemoglobin                14.2  g/dL                   04.5-40.9   Hematocrit                42.1 %                      36.0-46.0   MCV                       89.0 fl                     78.0-100.0   MCHC                      33.7 g/dL                   81.1-91.4   RDW                       12.2 %                      11.5-14.6   Platelet Count            229.0 K/uL                  150.0-400.0   Neutrophil %         [L]  32.5 %                      43.0-77.0     Manual smear review agrees with instrument differential.   Lymphocyte %         [H]  56.0 H %                    12.0-46.0   Monocyte %                8.9 %                       3.0-12.0   Eosinophils%              2.0 %                       0.0-5.0   Basophils %               0.6 %                       0.0-3.0   Neutrophill Absolute      1.6 K/uL  1.4-7.7   Lymphocyte Absolute       2.9 K/uL                    0.7-4.0   Monocyte Absolute         0.4 K/uL                    0.1-1.0  Eosinophils, Absolute                             0.1 K/uL                    0.0-0.7   Basophils Absolute        0.0 K/uL                    0.0-0.1  Tests: (4) Hepatic/Liver Function Panel (HEPATIC)   Total Bilirubin           1.1 mg/dL                   1.6-1.0   Direct Bilirubin          0.1 mg/dL                   9.6-0.4   Alkaline Phosphatase      78 U/L                      39-117   AST                       19 U/L                      0-37   ALT                       21 U/L                      0-35   Total Protein             8.3 g/dL                    5.4-0.9   Albumin                   4.4 g/dL                    8.1-1.9  Tests: (5) TSH (TSH)   FastTSH                   3.56 uIU/mL                 0.35-5.50  Tests: (6) UDip Only (UDIP)   Color                     LT. YELLOW       RANGE:  Yellow;Lt. Yellow   Clarity                   CLEAR                       Clear   Specific Gravity          1.010  1.000 - 1.030    Urine Ph                  5.5                         5.0-8.0   Protein                   NEGATIVE                    Negative   Urine Glucose             NEGATIVE                    Negative   Ketones                   NEGATIVE                    Negative   Urine Bilirubin           NEGATIVE                    Negative   Blood                     TRACE-LYSED                 Negative   Urobilinogen              0.2                         0.0 - 1.0   Leukocyte Esterace        NEGATIVE                    Negative   Nitrite                   NEGATIVE                    NegativePrescriptions: LIPITOR 20 MG  TABS (ATORVASTATIN CALCIUM) once daily  #90 x 3   Entered and Authorized by:   Jacques Navy MD   Signed by:   Jacques Navy MD on 10/14/2009   Method used:   Faxed to ...       [facility] (retail)       [address]       [place], [place]  [address]       Ph: [phone]       Fax: [phone]   RxID:   [id number] LISINOPRIL-HYDROCHLOROTHIAZIDE 20-12.5 MG TABS (LISINOPRIL-HYDROCHLOROTHIAZIDE) 1 by mouth once daily  #90 x 3   Entered and Authorized by:   Jacques Navy MD   Signed by:   Jacques Navy MD on 10/14/2009   Method used:   Faxed to ...       [facility] (retail)       [address]       [place], [place]  [address]       Ph: [phone]       Fax: [phone]   RxID:   [id number] SYNTHROID 112 MCG  TABS (LEVOTHYROXINE SODIUM) 1 by mouth once daily  #90 x 3   Entered and Authorized by:   Jacques Navy MD   Signed by:   Jacques Navy  MD on 10/14/2009   Method used:   Faxed to ...       [facility] (retail)       [address]       [place], [place]  [address]       Ph: [phone]       Fax: [phone]   RxID:   [id number]    Preventive Care Screening  Pap Smear:    Date:  10/05/2009    Results:  normal   Mammogram:    Date:  09/27/2009    Results:  normal bilateral     Preventive Care  Screening  Pap Smear:    Date:  10/05/2009    Results:  normal   Mammogram:    Date:  09/27/2009    Results:  normal bilateral

## 2010-11-08 NOTE — Progress Notes (Signed)
Summary: Synthroid  Phone Note From Pharmacy   Summary of Call: Pharm called. Pt has always filled rx for synthroid as BMN but last rx was not marked. Ok to update EMR to dispense as written?  Initial call taken by: Lamar Sprinkles, CMA,  October 15, 2009 3:19 PM  Follow-up for Phone Call        left a msg: I'm OK with generic. If she wants BNM she should call us back. Hold on Rx until then Follow-up by: Jacques Navy MD,  October 15, 2009 5:20 PM  Additional Follow-up for Phone Call Additional follow up Details #1::        Called patient to inquire, left message on machine to call back to office. Additional Follow-up by: Lucious Groves,  October 18, 2009 10:56 AM    Additional Follow-up for Phone Call Additional follow up Details #2::    Spoke with patient and brand name is needed because a MD told her 20 yrs ago (Dr. Juleen China) to always take brand name, do not switch to generic. Follow-up by: Lucious Groves,  October 18, 2009 4:46 PM  Additional Follow-up for Phone Call Additional follow up Details #3:: Details for Additional Follow-up Action Taken: ok for brand name only Additional Follow-up by: Jacques Navy MD,  October 18, 2009 5:48 PM  New/Updated Medications: SYNTHROID 112 MCG  TABS (LEVOTHYROXINE SODIUM) 1 by mouth once daily / Brand Medically Necessary [BMN] Prescriptions: SYNTHROID 112 MCG  TABS (LEVOTHYROXINE SODIUM) 1 by mouth once daily / Brand Medically Necessary Brand medically necessary #90 x 3   Entered by:   Lamar Sprinkles, CMA   Authorized by:   Jacques Navy MD   Signed by:   Lamar Sprinkles, CMA on 10/20/2009   Method used:   Faxed to ...       [facility] (retail)       [address]       [place], [place]  [address]       Ph: [phone]       Fax: [phone]   RxID:   [id number]

## 2010-11-08 NOTE — Assessment & Plan Note (Signed)
Summary: PER FLAG/MEN--7-10 POST HOSP--PHONE  STC   Vital Signs:  Patient profile:   60 year old female Height:      65 inches (165.10 cm) Weight:      172 pounds (78.18 kg) BMI:     28.73 O2 Sat:      96 % on Room air Temp:     98.5 degrees F (36.94 degrees C) oral Pulse rate:   78 / minute Pulse rhythm:   regular BP sitting:   102 / 70  (left arm) Cuff size:   regular  Vitals Entered By: Brenton Grills (January 26, 2010 9:07 AM)  O2 Flow:  Room air CC: pt here for post hosp f/u visit/pt states she has finished course of metronidazole/aj   Primary Care Provider:  Aymen Widrig  CC:  pt here for post hosp f/u visit/pt states she has finished course of metronidazole/aj.  History of Present Illness: Ms. Koenig presents for hospital follow-up. All records reviewed. She had been diagnosed in the office with diverticulitis but had progressive symptoms and could not take by mouth meds leading to brief hospitalization. She did well without complications and was discharged home to complete antibiotics as an outpatient.  She reports that since discharge she has been OK but feels weak and that she has not fully recovered. She has no particular symptoms.   Current Medications (verified): 1)  Synthroid 112 Mcg  Tabs (Levothyroxine Sodium) .Marland Kitchen.. 1 By Mouth Once Daily / Brand Medically Necessary 2)  Lisinopril-Hydrochlorothiazide 20-12.5 Mg Tabs (Lisinopril-Hydrochlorothiazide) .Marland Kitchen.. 1 By Mouth Once Daily 3)  Lipitor 20 Mg  Tabs (Atorvastatin Calcium) .... Once Daily 4)  Metronidazole 500 Mg Tabs (Metronidazole) .... One By Mouth Three Times A Day For 10 Days  Allergies (verified): No Known Drug Allergies PMH-FH-SH reviewed-no changes except otherwise noted  Review of Systems       The patient complains of weight loss and abdominal pain.  The patient denies anorexia, fever, decreased hearing, chest pain, syncope, dyspnea on exertion, peripheral edema, hematochezia, muscle weakness, abnormal  bleeding, and enlarged lymph nodes.         mild abdominal pain.   Physical Exam  General:  Well-developed,well-nourished,in no acute distress; alert,appropriate and cooperative throughout examination Head:  normocephalic and atraumatic.   Eyes:  no jaundice Lungs:  Normal respiratory effort, chest expands symmetrically. Lungs are clear to auscultation, no crackles or wheezes. Heart:  Normal rate and regular rhythm. S1 and S2 normal without gallop, murmur, click, rub or other extra sounds. Abdomen:  soft, normal bowel sounds, and no guarding.   Neurologic:  alert & oriented X3 and cranial nerves II-XII intact.   Skin:  turgor normal and color normal.   Psych:  Oriented X3, good eye contact, and not anxious appearing.     Impression & Recommendations:  Problem # 1:  ABDOMINAL PAIN, LOWER (ICD-789.09) Resolveing diverticulitis. Patient is just now finishing antibiotics. Her pain is much better.  Plan- complete antibiotics  Complete Medication List: 1)  Synthroid 112 Mcg Tabs (Levothyroxine sodium) .Marland Kitchen.. 1 by mouth once daily / brand medically necessary 2)  Lisinopril-hydrochlorothiazide 20-12.5 Mg Tabs (Lisinopril-hydrochlorothiazide) .Marland Kitchen.. 1 by mouth once daily 3)  Lipitor 20 Mg Tabs (Atorvastatin calcium) .... Once daily 4)  Metronidazole 500 Mg Tabs (Metronidazole) .... One by mouth three times a day for 10 days

## 2010-11-08 NOTE — Progress Notes (Signed)
----   Converted from flag ---- ---- 01/19/2010 6:59 AM, Jacques Navy MD wrote: Ms. Duncombe needs an office hospital follow-up 7-10 days. Please call her at home this PM with appointment.  Thanks ------------------------------ Gave pt appt:  01/26/10 @ 9a w/Dr Ala Bent

## 2010-11-08 NOTE — Progress Notes (Signed)
Summary: RESULTS  Phone Note Call from Patient Call back at Work Phone [phone]   Summary of Call: Patient is requesting results of last labs and xray.  Initial call taken by: Lamar Sprinkles, CMA,  January 14, 2010 11:27 AM  Follow-up for Phone Call        xray shows a possible gallstone, urine had blood in it, blood work looks good Follow-up by: Etta Grandchild MD,  January 14, 2010 11:35 AM

## 2010-11-08 NOTE — Progress Notes (Signed)
Summary: Lipitor alternative  Phone Note Call from Patient   Summary of Call: Patient left message on triage requesting alternate/generic for Liptior that will not have side effects and work just as well. Please advise. Initial call taken by: Lucious Groves,  Feb 24, 2010 4:36 PM  Follow-up for Phone Call        simvastatin 40mg  once daily . left msg for patient on ans mach to call back on monday.  Follow-up by: Jacques Navy MD,  Feb 25, 2010 6:33 PM  Additional Follow-up for Phone Call Additional follow up Details #1::        left mess to call office back w/pharm info Additional Follow-up by: Lamar Sprinkles, CMA,  Feb 28, 2010 11:36 AM    Additional Follow-up for Phone Call Additional follow up Details #2::    called pt and lmoam at work for her to call back to verify pharm  Follow-up by: Ami Bullins CMA,  Mar 01, 2010 9:11 AM  New/Updated Medications: SIMVASTATIN 40 MG TABS (SIMVASTATIN) 1 by mouth once daily Prescriptions: SIMVASTATIN 40 MG TABS (SIMVASTATIN) 1 by mouth once daily  #90 x 3   Entered by:   Ami Bullins CMA   Authorized by:   Jacques Navy MD   Signed by:   Bill Salinas CMA on 03/01/2010   Method used:   Faxed to ...       [facility] (retail)       [address]       [place], [place]  [address]       Ph: [phone]       Fax: [phone]   RxID:   [id number]

## 2010-11-08 NOTE — Progress Notes (Signed)
Summary: RESULTS  Phone Note Call from Patient   Summary of Call: See previous phone note, left mess for pt to call office back. Any further suggestions?  Initial call taken by: Lamar Sprinkles, CMA,  January 14, 2010 6:26 PM

## 2010-11-18 ENCOUNTER — Telehealth: Payer: Self-pay | Admitting: Internal Medicine

## 2010-11-24 NOTE — Progress Notes (Signed)
  Phone Note Refill Request Message from:  Pharmacy  Refills Requested: Medication #1:  LISINOPRIL-HYDROCHLOROTHIAZIDE 20-12.5 MG TABS 1 by mouth once daily Initial call taken by: Lamar Sprinkles, CMA,  November 18, 2010 2:11 PM    Prescriptions: LISINOPRIL-HYDROCHLOROTHIAZIDE 20-12.5 MG TABS (LISINOPRIL-HYDROCHLOROTHIAZIDE) 1 by mouth once daily  #90 x 2   Entered by:   Lamar Sprinkles, CMA   Authorized by:   Jacques Navy MD   Signed by:   Lamar Sprinkles, CMA on 11/18/2010   Method used:   Faxed to ...       [facility] (retail)       [address]       [place], [place]  [address]       Ph: [phone]       Fax: [phone]   RxID:   [id number]

## 2010-12-28 LAB — URINALYSIS, ROUTINE W REFLEX MICROSCOPIC
Glucose, UA: NEGATIVE mg/dL
Protein, ur: NEGATIVE mg/dL
pH: 6 (ref 5.0–8.0)

## 2010-12-28 LAB — CBC
HCT: 36.9 % (ref 36.0–46.0)
Hemoglobin: 12.5 g/dL (ref 12.0–15.0)
Hemoglobin: 13.9 g/dL (ref 12.0–15.0)
MCHC: 33.9 g/dL (ref 30.0–36.0)
MCV: 87.1 fL (ref 78.0–100.0)
Platelets: 310 10*3/uL (ref 150–400)
RBC: 4.24 MIL/uL (ref 3.87–5.11)
RDW: 12.7 % (ref 11.5–15.5)
WBC: 4.9 10*3/uL (ref 4.0–10.5)

## 2010-12-28 LAB — HEMOGLOBIN A1C: Hgb A1c MFr Bld: 6 % (ref 4.6–6.1)

## 2010-12-28 LAB — BASIC METABOLIC PANEL
Chloride: 108 mEq/L (ref 96–112)
GFR calc Af Amer: >60 mL/min (ref >60)
Potassium: 3.4 mEq/L — ABNORMAL LOW (ref 3.5–5.1)

## 2010-12-28 LAB — HEMOCCULT GUIAC POC 1CARD (OFFICE)
Fecal Occult Bld: NEGATIVE
Fecal Occult Bld: NEGATIVE

## 2010-12-28 LAB — COMPREHENSIVE METABOLIC PANEL
ALT: 15 U/L (ref 0–35)
Albumin: 4.1 g/dL (ref 3.5–5.2)
BUN: 7 mg/dL (ref 6–23)
Calcium: 9.4 mg/dL (ref 8.4–10.5)
Glucose, Bld: 121 mg/dL — ABNORMAL HIGH (ref 70–99)
Sodium: 136 mEq/L (ref 135–145)
Total Protein: 8.3 g/dL (ref 6.0–8.3)

## 2010-12-28 LAB — DIFFERENTIAL
Lymphs Abs: 2.1 10*3/uL (ref 0.7–4.0)
Monocytes Absolute: 0.5 10*3/uL (ref 0.1–1.0)
Monocytes Relative: 9 % (ref 3–12)
Neutro Abs: 2.9 10*3/uL (ref 1.7–7.7)
Neutrophils Relative %: 50 % (ref 43–77)

## 2011-01-30 ENCOUNTER — Telehealth: Payer: Self-pay

## 2011-01-30 NOTE — Telephone Encounter (Signed)
Patient lmovm requesting to know if MD could tell her what her blood type is. Please advise

## 2011-01-30 NOTE — Telephone Encounter (Signed)
I don't know her blood type. It is not something we check. If she donates blood she can find out. If she wants to know we can order lab but if she needs a transfusion at any time she will need to have type and cross-match even if she knows her blood type.

## 2011-02-01 NOTE — Telephone Encounter (Signed)
Patient informed. 

## 2011-02-24 NOTE — Assessment & Plan Note (Signed)
[facility]                             PRIMARY CARE OFFICE NOTE   NAME:Sonya Brady, Sonya Brady                       MRN:          [id number]  DATE:07/12/2006                            DOB:          1951-09-03    [name] is a delightful 60 year old woman followed for hypothyroid  disease and hypertension who presents for followup evaluation and exam.  In  the interval since her last visit, March 20, 2005, the patient has been seen  by Dr. Thomos Lemons in October 2006, for an upper respiratory infection and  again in September 2007, for upper respiratory infection.  She has made good  recovery from these acute illnesses and is otherwise doing well.   CHIEF COMPLAINT:  Problem with nocturnal leg cramps as well as she is  concerned about some freckles.   PAST MEDICAL HISTORY:  Well documented in my note of March 20, 2005.   FAMILY HISTORY:  Well documented in my note of March 20, 2005.   SOCIAL HISTORY:  The patient's son did get married but unfortunately 2  months after the wedding, his 67 year old wife developed an ovarian dermoid  tumor requiring oophorectomy and chemotherapy.  She has made a good recovery  and is now living with her husband at her mother-in-law's.  The patient's  daughter is also living in the home.  The patient continues to work with no  change in her employment.   PHYSICIAN ROSTER:  1. Karie Soda. Joseph Art, M.D. for dermatology.  2. Marlyn Corporal, M.D. for psychiatry.  3. Currie Paris, M.D. for general surgery.  4. Dr. Marisa Sprinkles in [facility] for ENT.  5. Melvenia Needles, M.D. for ophthalmology.  6. Malachi Pro. Ambrose Mantle, M.D. for GYN.   CURRENT MEDICATIONS:  1. Lipitor 20 mg daily.  2. Synthroid 112 mcg daily.  3. Hydrochlorothiazide 25 mg daily.  4. Triflex vitamins.  5. Triple Omega.  6. Valerian root for sleep.  7. Glucosamine for joint pain.   HEALTH MAINTENANCE:  The patient reports she had a mammogram in the spring  of 2007,  which was abnormal requiring an ultrasound which revealed no mass  or abnormality.  She had a followup mammogram September 2007, which was  unremarkable.  The patient's last pelvic and Pap was May 2007, and was  normal.  The patient has not been able to schedule a colonoscopy in 2004 or  2006, but at this time is willing to reschedule and reassures me that she  will in fact keep the appointment.  Last thyroid ultrasound, April 25, 2005,  which revealed mild diffuse enlargement of the thyroid gland without focal  solid or cystic masses.   REVIEW OF SYSTEMS:  The patient is sleeping well with the Valerian root  helping her sleep.  She does have hot flashes.  Last eye exam was less than  12 months ago and the patient is status post cataract surgery.  No  cardiovascular or respiratory complaints.  The patient has been taking  Guccie juice which is Himalayan berry juice which has been helpful for her  digestive  system.  She has nocturia time 2 to 3 which she attributes to  copious water intake.  No musculoskeletal or dermatologic problems.   PHYSICAL EXAMINATION:  VITAL SIGNS:  Temperature 97.3, blood pressure  154/93, pulse 96, weight 172.  GENERAL APPEARANCE:  A well nourished, well developed woman in no acute  distress.  HEENT:  Normocephalic atraumatic.  EACs and TMs were unremarkable.  Oropharynx with native dentition in good repair.  No buccal or palatal  lesions were noted.  Posterior pharynx was clear.  Conjunctivae and sclerae  were clear.  PERRLA.  EOMI.  Funduscopic exam deferred to Dr. Eulah Pont.  NECK:  Supple without thyromegaly.  NODES:  No adenopathy was noted in the cervical, supraclavicular regions.  CHEST:  No CVA tenderness.  LUNGS:  Clear to auscultation and percussion.  BREASTS:  Deferred to gynecology.  CARDIOVASCULAR:  With 2+ radial pulses.  No JVD or carotid bruits.  She had  a quiet precordium with a regular rate and rhythm without murmurs, rubs, or  gallops.   ABDOMEN:  Soft.  No guarding.  No rebound.  No organomegaly splenomegaly  appreciated.  PELVIC:  Deferred to gynecology.  RECTAL:  Deferred to gynecology and upcoming GI evaluation.  EXTREMITIES:  Without clubbing, cyanosis, or edema.  No deformities were  noted.  NEUROLOGIC:  Nonfocal.  DERM:  The patient has no significant abnormalities to my exam.  She was not  fully unclothed for this exam.   LABORATORY:  Hemoglobin 13.9 grams, white count was 4,500 with 35.7% segs,  52.7% lymphs, 9% monos, 1.9% eosinophils.  Cholesterol 148, triglycerides  61, HDL 37.2, LDL 99.  Chemistries were unremarkable with a serum glucose of  115.  Electrolytes were normal.  Kidney function normal with a creatinine of  0.8 and a GFR of 79 ml/min.  Liver functions were normal.  Thyroid function  normal with a TSH of 1.75.   ASSESSMENT/PLAN:  1. Hyperlipidemia.  Patient with good results and good control at goal      with an LDL of less than 100.  Plan:  The patient will continue her      present medications.  2. Hypertension.  The patient is sub-optimally controlled.  Discussed this      with her.  Plan:  The patient is to change to Lisinopril HCT 10/12.5      mg.  One month supply is written.  The patient will return in 3 weeks      for followup laboratory as well followup BUN and creatinine.  3. Hypothyroid disease.  The patient's TSH is normal and she is well      controlled.  We will continue the present medications.  4. Health maintenance.  The patient is current with her gynecologist.  She      has had mammograms as noted.  I will refer her for colonoscopy.  5. The patient is concerned about some hair loss in the forehead area.      Her labs are unremarkable.  If she continues to have hair loss, would      refer her to dermatology.   In summary, a very pleasant patient who seems to be medically stable.  She  will return in 3 weeks for followup on her hypertension as noted.            ______________________________  Rosalyn Gess Norins, MD      MEN/MedQ  DD:  07/12/2006  DT:  07/13/2006  Job #:  [id number]  cc:   Andree Elk

## 2011-03-24 ENCOUNTER — Encounter: Payer: Self-pay | Admitting: Internal Medicine

## 2011-04-23 ENCOUNTER — Other Ambulatory Visit: Payer: Self-pay | Admitting: Internal Medicine

## 2011-08-01 ENCOUNTER — Other Ambulatory Visit: Payer: Self-pay | Admitting: Internal Medicine

## 2011-12-19 ENCOUNTER — Telehealth: Payer: Self-pay | Admitting: *Deleted

## 2011-12-19 NOTE — Telephone Encounter (Signed)
Left msg on vm pt states been having cough x's 1 month. Currently taking ovc cough syrup but not helping Want to know does she need ov or md can rx something. Called pt back will need ov last ov was 09/05/10 will need to be evaluated... 12/19/11@2 :57pm/LMB

## 2011-12-20 ENCOUNTER — Encounter: Payer: Self-pay | Admitting: Internal Medicine

## 2011-12-20 ENCOUNTER — Ambulatory Visit (INDEPENDENT_AMBULATORY_CARE_PROVIDER_SITE_OTHER): Payer: PRIVATE HEALTH INSURANCE | Admitting: Internal Medicine

## 2011-12-20 VITALS — BP 124/82 | HR 105 | Temp 98.6°F | Resp 16 | Wt 183.0 lb

## 2011-12-20 DIAGNOSIS — J069 Acute upper respiratory infection, unspecified: Secondary | ICD-10-CM

## 2011-12-20 MED ORDER — PROMETHAZINE-CODEINE 6.25-10 MG/5ML PO SYRP: 5.0000 mL | ORAL_SOLUTION | ORAL | Status: AC | PRN

## 2011-12-20 NOTE — Patient Instructions (Signed)
Viral upper respiratory infection with a cough, eustachian tube dysfunction. The chest hurts from muscle strain with coughing.  Plan - ecchinacea, vitamin C 1500 mg daily, hydrate.            Promethazine with codeine cough syrup 1 tsp every 4-6 hours for the cough   Barotitis Media Barotitis media is soreness (inflammation) of the area behind the eardrum (middle ear). This occurs when the auditory tube (Eustachian tube) leading from the back of the throat to the eardrum is blocked. When it is blocked air cannot move in and out of the middle ear to equalize pressure changes. These pressure changes come from changes in altitude when:  Flying.   Driving in the mountains.   Diving.  Problems are more likely to occur with pressure changes during times when you are congested as from:  Hay fever.   Upper respiratory infection.   A cold.  Damage or hearing loss (barotrauma) caused by this may be permanent. HOME CARE INSTRUCTIONS    Use medicines as recommended by your caregiver. Over the counter medicines will help unblock the canal and can help during times of air travel.   Do not put anything into your ears to clean or unplug them. Eardrops will not be helpful.   Do not swim, dive, or fly until your caregiver says it is all right to do so. If these activities are necessary, chewing gum with frequent swallowing may help. It is also helpful to hold your nose and gently blow to pop your ears for equalizing pressure changes. This forces air into the Eustachian tube.   For little ones with problems, give your baby a bottle of water or juice during periods when pressure changes would be anticipated such as during take offs and landings associated with air travel.   Only take over-the-counter or prescription medicines for pain, discomfort, or fever as directed by your caregiver.   A decongestant may be helpful in de-congesting the middle ear and make pressure equalization easier. This can be  even more effective if the drops (spray) are delivered with the head lying over the edge of a bed with the head tilted toward the ear on the affected side.   If your caregiver has given you a follow-up appointment, it is very important to keep that appointment. Not keeping the appointment could result in a chronic or permanent injury, pain, hearing loss and disability. If there is any problem keeping the appointment, you must call back to this facility for assistance.  SEEK IMMEDIATE MEDICAL CARE IF:    You develop a severe headache, dizziness, severe ear pain, or bloody or pus-like drainage from your ears.   An oral temperature above 102 F (38.9 C) develops.   Your problems do not improve or become worse.  MAKE SURE YOU:    Understand these instructions.   Will watch your condition.   Will get help right away if you are not doing well or get worse.  Document Released: 09/22/2000 Document Revised: 09/14/2011 Document Reviewed: 04/30/2008 [facility] Patient Information 2012 [place], [place].   Upper Respiratory Infection, Adult An upper respiratory infection (URI) is also sometimes known as the common cold. The upper respiratory tract includes the nose, sinuses, throat, trachea, and bronchi. Bronchi are the airways leading to the lungs. Most people improve within 1 week, but symptoms can last up to 2 weeks. A residual cough may last even longer.   CAUSES Many different viruses can infect the tissues lining the upper  respiratory tract. The tissues become irritated and inflamed and often become very moist. Mucus production is also common. A cold is contagious. You can easily spread the virus to others by oral contact. This includes kissing, sharing a glass, coughing, or sneezing. Touching your mouth or nose and then touching a surface, which is then touched by another person, can also spread the virus. SYMPTOMS   Symptoms typically develop 1 to 3 days after you come in contact with a cold  virus. Symptoms vary from person to person. They may include:  Runny nose.   Sneezing.   Nasal congestion.   Sinus irritation.   Sore throat.   Loss of voice (laryngitis).   Cough.   Fatigue.   Muscle aches.   Loss of appetite.   Headache.   Low-grade fever.  DIAGNOSIS   You might diagnose your own cold based on familiar symptoms, since most people get a cold 2 to 3 times a year. Your caregiver can confirm this based on your exam. Most importantly, your caregiver can check that your symptoms are not due to another disease such as strep throat, sinusitis, pneumonia, asthma, or epiglottitis. Blood tests, throat tests, and X-rays are not necessary to diagnose a common cold, but they may sometimes be helpful in excluding other more serious diseases. Your caregiver will decide if any further tests are required. RISKS AND COMPLICATIONS   You may be at risk for a more severe case of the common cold if you smoke cigarettes, have chronic heart disease (such as heart failure) or lung disease (such as asthma), or if you have a weakened immune system. The very young and very old are also at risk for more serious infections. Bacterial sinusitis, middle ear infections, and bacterial pneumonia can complicate the common cold. The common cold can worsen asthma and chronic obstructive pulmonary disease (COPD). Sometimes, these complications can require emergency medical care and may be life-threatening. PREVENTION   The best way to protect against getting a cold is to practice good hygiene. Avoid oral or hand contact with people with cold symptoms. Wash your hands often if contact occurs. There is no clear evidence that vitamin C, vitamin E, echinacea, or exercise reduces the chance of developing a cold. However, it is always recommended to get plenty of rest and practice good nutrition. TREATMENT   Treatment is directed at relieving symptoms. There is no cure. Antibiotics are not effective, because  the infection is caused by a virus, not by bacteria. Treatment may include:  Increased fluid intake. Sports drinks offer valuable electrolytes, sugars, and fluids.   Breathing heated mist or steam (vaporizer or shower).   Eating chicken soup or other clear broths, and maintaining good nutrition.   Getting plenty of rest.   Using gargles or lozenges for comfort.   Controlling fevers with ibuprofen or acetaminophen as directed by your caregiver.   Increasing usage of your inhaler if you have asthma.  Zinc gel and zinc lozenges, taken in the first 24 hours of the common cold, can shorten the duration and lessen the severity of symptoms. Pain medicines may help with fever, muscle aches, and throat pain. A variety of non-prescription medicines are available to treat congestion and runny nose. Your caregiver can make recommendations and may suggest nasal or lung inhalers for other symptoms.   HOME CARE INSTRUCTIONS    Only take over-the-counter or prescription medicines for pain, discomfort, or fever as directed by your caregiver.   Use a warm mist humidifier or  inhale steam from a shower to increase air moisture. This may keep secretions moist and make it easier to breathe.   Drink enough water and fluids to keep your urine clear or pale yellow.   Rest as needed.   Return to work when your temperature has returned to normal or as your caregiver advises. You may need to stay home longer to avoid infecting others. You can also use a face mask and careful hand washing to prevent spread of the virus.  SEEK MEDICAL CARE IF:    After the first few days, you feel you are getting worse rather than better.   You need your caregiver's advice about medicines to control symptoms.   You develop chills, worsening shortness of breath, or brown or red sputum. These may be signs of pneumonia.   You develop yellow or brown nasal discharge or pain in the face, especially when you bend forward. These may  be signs of sinusitis.   You develop a fever, swollen neck glands, pain with swallowing, or white areas in the back of your throat. These may be signs of strep throat.  SEEK IMMEDIATE MEDICAL CARE IF:    You have a fever.   You develop severe or persistent headache, ear pain, sinus pain, or chest pain.   You develop wheezing, a prolonged cough, cough up blood, or have a change in your usual mucus (if you have chronic lung disease).   You develop sore muscles or a stiff neck.  Document Released: 03/21/2001 Document Revised: 09/14/2011 Document Reviewed: 01/27/2011 [facility] Patient Information 2012 [place], [place].

## 2011-12-24 NOTE — Progress Notes (Signed)
SUBJECTIVE:  Sonya Brady is a 61 y.o. female who complains of congestion, sore throat, swollen glands, nasal blockage and dry cough for 3 days. She denies a history of chest pain, chills, fevers, nausea and shortness of breath and denies a history of asthma. Patient does not smoke cigarettes.   OBJECTIVE: She appears well, vital signs are as noted. Ears normal.  Poor movement of TM with insuffulation. Throat and pharynx normal.  Neck supple. No adenopathy in the neck. Nose is congested. Sinuses non tender. The chest is clear, without wheezes or rales.  ASSESSMENT:  viral upper respiratory illness  PLAN: Symptomatic therapy suggested: push fluids, rest, gargle warm salt water, sudafed 30 mg bid or tid for ear pressure and return office visit prn if symptoms persist or worsen. Lack of antibiotic effectiveness discussed with her. Call or return to clinic prn if these symptoms worsen or fail to improve as anticipated.

## 2011-12-28 ENCOUNTER — Telehealth: Payer: Self-pay

## 2011-12-28 MED ORDER — AZITHROMYCIN 250 MG PO TABS: ORAL_TABLET | ORAL | Status: AC

## 2011-12-28 MED ORDER — PREDNISONE 20 MG PO TABS: 20.0000 mg | ORAL_TABLET | Freq: Two times a day (BID) | ORAL | Status: AC

## 2011-12-28 MED ORDER — BENZONATATE 100 MG PO CAPS: 100.0000 mg | ORAL_CAPSULE | Freq: Three times a day (TID) | ORAL | Status: AC | PRN

## 2011-12-28 NOTE — Telephone Encounter (Signed)
Pt advised of Rx/pharmacy 

## 2011-12-28 NOTE — Telephone Encounter (Signed)
Pt called stating that she was advised to call back if cough medication does not work. Pt says she is still cn=ongested and having nonproductive cough, please advise, pt says that MD mention possible ABX treatment at OV.

## 2011-12-28 NOTE — Telephone Encounter (Signed)
Ok for z-pak as directed. OK for tessalon perles 100 mg tid-#30            Prednisone 20 mg daily x 7-#14

## 2012-04-03 ENCOUNTER — Encounter: Payer: Self-pay | Admitting: Internal Medicine

## 2012-06-05 ENCOUNTER — Other Ambulatory Visit: Payer: Self-pay | Admitting: Internal Medicine

## 2012-07-05 ENCOUNTER — Encounter (HOSPITAL_COMMUNITY): Payer: Self-pay | Admitting: *Deleted

## 2012-07-05 ENCOUNTER — Emergency Department (HOSPITAL_COMMUNITY)
Admission: EM | Admit: 2012-07-05 | Discharge: 2012-07-05 | Disposition: A | Discharge status: Home / Self Care | Payer: PRIVATE HEALTH INSURANCE | Source: Home / Self Care | Attending: Emergency Medicine | Admitting: Emergency Medicine

## 2012-07-05 DIAGNOSIS — M6530 Trigger finger, unspecified finger: Secondary | ICD-10-CM

## 2012-07-05 DIAGNOSIS — M65319 Trigger thumb, unspecified thumb: Secondary | ICD-10-CM

## 2012-07-05 HISTORY — DX: Gastro-esophageal reflux disease without esophagitis: K21.9

## 2012-07-05 HISTORY — DX: Pure hypercholesterolemia, unspecified: E78.00

## 2012-07-05 MED ORDER — TRAMADOL HCL 50 MG PO TABS: 100.0000 mg | ORAL_TABLET | Freq: Three times a day (TID) | ORAL | Status: DC | PRN

## 2012-07-05 MED ORDER — METHYLPREDNISOLONE ACETATE 40 MG/ML IJ SUSP
INTRAMUSCULAR | Status: AC
  Filled 2012-07-05: qty 5

## 2012-07-05 NOTE — ED Notes (Signed)
C/O left thumb pain x approx 2 wks after working with heavy chicken wire in yard.  Has been using BenGay, hot water soaks, and heating pad without relief.

## 2012-07-05 NOTE — ED Provider Notes (Signed)
Chief Complaint  Patient presents with  . Hand Pain    History of Present Illness:   Sonya Brady is a 61 year old female who has had a one-week history of triggering of her left thumb after cutting some chicken wire fence to keep beavers out of her trees in her back yard. She describes pain and swelling at the base of the left thumb and popping of the thumb. She denies of any triggering of other fingers or pain in other fingers or her hand. No numbness, tingling, weakness.  Review of Systems:  Other than noted above, the patient denies any of the following symptoms: Systemic:  No fevers, chills, sweats, or aches.  No fatigue or tiredness. Musculoskeletal:  No joint pain, arthritis, bursitis, swelling, back pain, or neck pain. Neurological:  No muscular weakness, paresthesias, headache, or trouble with speech or coordination.  No dizziness.  PMFSH:  Past medical history, family history, social history, meds, and allergies were reviewed.  Physical Exam:   Vital signs:  BP 142/87  Pulse 74  Temp 98.4 F (36.9 C) (Oral)  Resp 17  SpO2 96% Gen:  Alert and oriented times 3.  In no distress. Musculoskeletal: There was no obvious swelling but there was pain to palpation at the base of the left thumb with obvious triggering of the finger. Otherwise, all joints had a full a ROM with no swelling, bruising or deformity.  No edema, pulses full. Extremities were warm and pink.  Capillary refill was brisk.  Skin:  Clear, warm and dry.  No rash. Neuro:  Alert and oriented times 3.  Muscle strength was normal.  Sensation was intact to light touch.   Procedure Note:  Verbal informed consent was obtained from the patient.  Risks and benefits were outlined with the patient.  Patient understands and accepts these risks.  Identity of the patient was confirmed verbally and by armband.    Procedure was performed as followed:  The injection point was identified on the flexor tendon of the thumb at the base with a  triggering was occurring. This was marked with a pen. The area was then prepped with Betadine and alcohol. 1 cc of Depo-Medrol 40 mg strength and 1 cc of 2% Xylocaine were injected overlying the tendon. The patient tolerated this well without any immediate side effects. She was given aftercare instructions.  Patient tolerated the procedure well without any immediate complications.  Assessment:  The encounter diagnosis was Trigger thumb.  Plan:   1.  The following meds were prescribed:   New Prescriptions   TRAMADOL (ULTRAM) 50 MG TABLET    Take 2 tablets (100 mg total) by mouth every 8 (eight) hours as needed for pain.   2.  The patient was instructed in symptomatic care, including rest and activity, elevation, application of ice and compression.  Appropriate handouts were given. 3.  The patient was told to return if becoming worse in any way, if no better in 3 or 4 days, and given some red flag symptoms that would indicate earlier return.   4.  The patient was told to follow up with a hand surgeon, Dr. Mina Marble, if no improvement after 2 weeks.   Reuben Likes, MD 07/05/12 [id number]

## 2012-07-23 ENCOUNTER — Encounter: Payer: Self-pay | Admitting: Internal Medicine

## 2012-07-23 ENCOUNTER — Other Ambulatory Visit (INDEPENDENT_AMBULATORY_CARE_PROVIDER_SITE_OTHER): Payer: PRIVATE HEALTH INSURANCE

## 2012-07-23 ENCOUNTER — Ambulatory Visit (INDEPENDENT_AMBULATORY_CARE_PROVIDER_SITE_OTHER): Payer: PRIVATE HEALTH INSURANCE | Admitting: Internal Medicine

## 2012-07-23 VITALS — BP 112/78 | HR 92 | Temp 98.2°F | Resp 16 | Wt 173.0 lb

## 2012-07-23 DIAGNOSIS — Z0000 Encounter for general adult medical examination without abnormal findings: Secondary | ICD-10-CM

## 2012-07-23 DIAGNOSIS — E785 Hyperlipidemia, unspecified: Secondary | ICD-10-CM

## 2012-07-23 DIAGNOSIS — E1169 Type 2 diabetes mellitus with other specified complication: Secondary | ICD-10-CM | POA: Insufficient documentation

## 2012-07-23 DIAGNOSIS — I10 Essential (primary) hypertension: Secondary | ICD-10-CM

## 2012-07-23 DIAGNOSIS — R109 Unspecified abdominal pain: Secondary | ICD-10-CM

## 2012-07-23 DIAGNOSIS — E039 Hypothyroidism, unspecified: Secondary | ICD-10-CM

## 2012-07-23 DIAGNOSIS — F329 Major depressive disorder, single episode, unspecified: Secondary | ICD-10-CM

## 2012-07-23 LAB — COMPREHENSIVE METABOLIC PANEL
AST: 17 U/L (ref 0–37)
Albumin: 4.2 g/dL (ref 3.5–5.2)
Alkaline Phosphatase: 60 U/L (ref 39–117)
BUN: 9 mg/dL (ref 6–23)
CO2: 28 mEq/L (ref 19–32)
Chloride: 97 mEq/L (ref 96–112)
Total Bilirubin: 0.7 mg/dL (ref 0.3–1.2)
Total Protein: 8.2 g/dL (ref 6.0–8.3)

## 2012-07-23 LAB — T4, FREE: Free T4: 1.36 ng/dL (ref 0.60–1.60)

## 2012-07-23 LAB — LIPID PANEL
Cholesterol: 163 mg/dL (ref 0–200)
Total CHOL/HDL Ratio: 4
Triglycerides: 69 mg/dL (ref 0.0–149.0)

## 2012-07-23 LAB — HEPATIC FUNCTION PANEL
ALT: 19 U/L (ref 0–35)
AST: 17 U/L (ref 0–37)
Alkaline Phosphatase: 60 U/L (ref 39–117)
Total Bilirubin: 0.7 mg/dL (ref 0.3–1.2)

## 2012-07-23 LAB — TSH: TSH: 2.55 u[IU]/mL (ref 0.35–5.50)

## 2012-07-23 MED ORDER — AMOXICILLIN-POT CLAVULANATE 875-125 MG PO TABS: 1.0000 | ORAL_TABLET | Freq: Two times a day (BID) | ORAL | Status: DC

## 2012-07-23 NOTE — Progress Notes (Signed)
Subjective:    Patient ID: Sonya Brady, female    DOB: [birth year], 61 y.o.   MRN: [id number]  HPI Sonya Brady presents for routine evaluation. [place] left lower quadrant. She has a history of diverticulitis.  She saw Dr. Ambrose Mantle in September '13 and had general exam w/o  PAP and she had both breast exam and mammogram. She saw Dr. Eulah Pont for Ophthal - had mild increased in intraocular pressure and will need surveillance every 6 months. She has not shingles vaccine.  Past Medical History  Diagnosis Date  . Depression   . Hypertension   . Hypothyroidism   . Acne   . Breast mass, right   . GERD (gastroesophageal reflux disease)   . Hypercholesteremia    Past Surgical History  Procedure Date  . Tympanoplasty     tube left ear  . Abdominal hysterectomy     both ovary intact   Family History  Problem Relation Age of Onset  . Other Father 8    deceased, unknown causes in 03/10/23  . Hypertension Mother   . Other Mother 63    SBO, deceased  . Breast cancer Neg Hx   . Colon cancer Neg Hx   . Thyroid disease Brother   . Thyroid disease Sister    History   Social History  . Marital Status: Single    Spouse Name: N/A    Number of Children: N/A  . Years of Education: N/A   Occupational History  . bookkeeper    Social History Main Topics  . Smoking status: Never Smoker   . Smokeless tobacco: Not on file  . Alcohol Use: No  . Drug Use: No  . Sexually Active: Not on file   Other Topics Concern  . Not on file   Social History Narrative   Married '72-widowed '[id number] daughter 09-Mar-1980, 1 son '53: son going thru divorce ['09]; applying to medical schoolWork: bookkeeper [employer]    Current Outpatient Prescriptions on File Prior to Visit  Medication Sig Dispense Refill  . Calcium Carbonate-Vitamin D (CALCIUM + D PO) Take by mouth.      . esomeprazole (NEXIUM) 40 MG capsule Take 40 mg by mouth daily  before breakfast.      . famotidine (PEPCID) 20 MG tablet Take 20 mg by mouth daily as needed. Before Meals.      Marland Kitchen lisinopril-hydrochlorothiazide (PRINZIDE,ZESTORETIC) 20-12.5 MG per tablet TAKE 1 TABLET BY MOUTH ONCE DAILY  90 tablet  1  . Multiple Vitamins-Minerals (MULTIVITAMIN PO) Take by mouth.      . simvastatin (ZOCOR) 40 MG tablet TAKE 1 TABLET BY MOUTH ONCE DAILY  90 tablet  2  . SYNTHROID 112 MCG tablet TAKE 1 TABLET BY MOUTH ONCE DAILY  90 tablet  2  . traMADol (ULTRAM) 50 MG tablet Take 2 tablets (100 mg total) by mouth every 8 (eight) hours as needed for pain.  30 tablet  0      Review of Systems Constitutional:  Negative for fever, chills, activity change and unexpected weight change.  HEENT:  Negative for hearing loss, ear pain, congestion, neck stiffness and postnasal drip. Negative for sore throat or swallowing problems. Negative for dental complaints.   Eyes: Negative for vision loss or change in visual acuity.  Respiratory: Negative for chest tightness and wheezing. Negative for DOE.   Cardiovascular: Negative for chest pain or palpitations. No decreased exercise tolerance Gastrointestinal:  No change in bowel habit. No bloating or gas. Positive for reflux or indigestion Genitourinary: Negative for urgency, frequency, flank pain and difficulty urinating.  Musculoskeletal: Negative for myalgias, back pain, arthralgias and gait problem.  Neurological: Negative for dizziness, tremors, weakness and headaches.  Hematological: Negative for adenopathy.  Psychiatric/Behavioral: Negative for behavioral problems and dysphoric mood.      Objective:   Physical Exam Filed Vitals:   07/23/12 1345  BP: 112/78  Pulse: 92  Temp: 98.2 F (36.8 C)  Resp: 16   Wt Readings from Last 3 Encounters:  07/23/12 173 lb (78.472 kg)  12/20/11 183 lb (83.008 kg)  09/05/10 170 lb (77.111 kg)   Gen'l: well nourished, well developed woman in no distress HEENT - [place]/AT, EACs/TMs normal-  tympanostomy tube left ear, oropharynx with native dentition in good condition, no buccal or palatal lesions, posterior pharynx clear, mucous membranes moist. C&S clear, PERRLA, fundi - normal Neck - supple, no thyromegaly Nodes- negative submental, cervical, supraclavicular regions Chest - no deformity, no CVAT Lungs - cleat without rales, wheezes. No increased work of breathing Breast - deferred to Gyn Cardiovascular - regular rate and rhythm, quiet precordium, no murmurs, rubs or gallops, 2+ radial, DP and PT pulses Abdomen - BS+ x 4, no HSM, no guarding, tender to palpation and percussion LLQ w/o rebound. Pelvic - deferred to gyn Rectal - deferred to gyn Extremities - no clubbing, cyanosis, edema or deformity.  Neuro - A&O x 3, CN II-XII normal, motor strength normal and equal, DTRs 2+ and symmetrical biceps, radial, and patellar tendons. Cerebellar - no tremor, no rigidity, fluid movement and normal gait. Derm - Head, neck, back, abdomen and extremities without suspicious lesions  Lab Results  Component Value Date   WBC 4.9 01/17/2010   HGB 12.5 01/17/2010   HCT 36.9 01/17/2010   PLT 268 01/17/2010   GLUCOSE 100* 07/23/2012   CHOL 163 07/23/2012   TRIG 69.0 07/23/2012   HDL 44.70 07/23/2012   LDLCALC 105* 07/23/2012        ALT 19 07/23/2012   AST 17 07/23/2012        NA 135 07/23/2012   K 4.1 07/23/2012   CL 97 07/23/2012   CREATININE 0.7 07/23/2012   BUN 9 07/23/2012   CO2 28 07/23/2012   TSH 2.55 07/23/2012   HGBA1C 6.0  01/17/2010          Assessment & Plan:

## 2012-07-23 NOTE — Patient Instructions (Addendum)
Normal exam except for tenderness in the left lower quadrant abdomen consistent with mild diverticulitis.  Plan Augmentin twice a day for 7 days  Hydrate  For pain - take tylenol 1,000 mg three times a day  For pain not controlled by tylenol let me know  General health seems good. Will check labs for cholesterol, thryoid and blood pressure. You will be sent a report and you may access the results using your computer and a MyChart account (see handout)

## 2012-07-24 DIAGNOSIS — Z0000 Encounter for general adult medical examination without abnormal findings: Secondary | ICD-10-CM | POA: Insufficient documentation

## 2012-07-24 NOTE — Assessment & Plan Note (Signed)
Lab Results  Component Value Date   TSH 2.55 07/23/2012   Normal range TSH on present dose of levothyroxine.  Plan Continue present medication.

## 2012-07-24 NOTE — Assessment & Plan Note (Signed)
Lipid panel reveals good control with LDL better than goal of 130 or less for low risk patients and close to goal of 100 for moderate to high risk patients. Liver functions are normal  Plan Continue present medications.

## 2012-07-24 NOTE — Assessment & Plan Note (Signed)
In good spirits and doing well. This problem seems resolved.

## 2012-07-24 NOTE — Assessment & Plan Note (Signed)
Interval history notable for LLQ abdominal pain acutely but otherwise history is negative. Limited physical exam notable for tenderness LLQ. She is current with Gyn for pelvic and breast exam. She is current for colorectal cancer and breast cancer screening. Immunizations: recommend pneumonia vaccine and shingles vaccine - for the latter she will check on insurance coverage.  In summary - a very nice woman who appears to be medically stable. She will call if LLQ abdominal pain does not resolve otherwise she will return in 1 year.

## 2012-07-24 NOTE — Assessment & Plan Note (Signed)
LLQ abdominal tenderness w/o guarding or rebound, no fever. Suspect mild diverticulitis.  Plan  Augmentin 875 bid x 7 days.   Patient to report back if symptoms do not resolve or the pain becomes worse.

## 2012-07-24 NOTE — Assessment & Plan Note (Signed)
BP Readings from Last 3 Encounters:  07/23/12 112/78  07/05/12 142/87  12/20/11 124/82   Good control on present medical regimen. No change contemplated.

## 2012-08-14 ENCOUNTER — Telehealth: Payer: Self-pay

## 2012-08-14 NOTE — Telephone Encounter (Signed)
Pt called requesting results of labs done October 2013, letter was not received and a copy is not available in EPIC, please advise.

## 2012-08-15 ENCOUNTER — Ambulatory Visit: Payer: PRIVATE HEALTH INSURANCE

## 2012-08-15 NOTE — Telephone Encounter (Signed)
Letter mailed, pt informed via VM

## 2012-08-15 NOTE — Telephone Encounter (Signed)
October 15 ov letter printed and put in the mail October 18th labs included in text. New copy of note printed she can pick up or we can mail it again.

## 2012-10-03 ENCOUNTER — Encounter: Payer: Self-pay | Admitting: Internal Medicine

## 2012-10-03 ENCOUNTER — Ambulatory Visit (INDEPENDENT_AMBULATORY_CARE_PROVIDER_SITE_OTHER): Payer: PRIVATE HEALTH INSURANCE | Admitting: Internal Medicine

## 2012-10-03 VITALS — BP 132/80 | HR 113 | Temp 100.3°F | Resp 12 | Wt 177.1 lb

## 2012-10-03 DIAGNOSIS — J029 Acute pharyngitis, unspecified: Secondary | ICD-10-CM

## 2012-10-03 MED ORDER — LIDOCAINE VISCOUS 2 % MT SOLN: 20.0000 mL | OROMUCOSAL | Status: DC | PRN

## 2012-10-03 MED ORDER — AMOXICILLIN-POT CLAVULANATE 875-125 MG PO TABS: 1.0000 | ORAL_TABLET | Freq: Two times a day (BID) | ORAL | Status: DC

## 2012-10-03 NOTE — Patient Instructions (Addendum)
Pharyngitis - also sinus involvement with pressure.  Plan - augmentin 875 mg twice a day for 10 days  Hydration with beverage of choice  Viscous Xylocaine: mix 1:1 with water, gargle and spit as needed for throat pain. Caution about numbing of the mouth and risk of thermal injury  Continue sudafed 30 mg two or three times a day  Robitussin DM 1 tsp every 4 hours as needed for cough  Tylenol 1,000 mg three times a day for aches and fever  May take ibuprofen 200 mg two tablets 3 times a day for uncontrolled pain.  Warm compresses to face or stovetop vaporizer: water to boil than turn off heat, add mentholatum and inhale using a towel tent.  Strep Throat Strep throat is an infection of the throat caused by a bacteria named Streptococcus pyogenes. Your caregiver may call the infection streptococcal "tonsillitis" or "pharyngitis" depending on whether there are signs of inflammation in the tonsils or back of the throat. Strep throat is most common in children from 6 to 61 years old during the cold months of the year, but it can occur in people of any age during any season. This infection is spread from person to person (contagious) through coughing, sneezing, or other close contact. SYMPTOMS    Fever or chills.   Painful, swollen, red tonsils or throat.   Pain or difficulty when swallowing.   White or yellow spots on the tonsils or throat.   Swollen, tender lymph nodes or "glands" of the neck or under the jaw.   Red rash all over the body (rare).  DIAGNOSIS   Many different infections can cause the same symptoms. A test must be done to confirm the diagnosis so the right treatment can be given. A "rapid strep test" can help your caregiver make the diagnosis in a few minutes. If this test is not available, a light swab of the infected area can be used for a throat culture test. If a throat culture test is done, results are usually available in a day or two. TREATMENT   Strep throat is treated  with antibiotic medicine. HOME CARE INSTRUCTIONS    Gargle with 1 tsp of salt in 1 cup of warm water, 3 to 4 times per day or as needed for comfort.   Family members who also have a sore throat or fever should be tested for strep throat and treated with antibiotics if they have the strep infection.   Make sure everyone in your household washes their hands well.   Do not share food, drinking cups, or personal items that could cause the infection to spread to others.   You may need to eat a soft food diet until your sore throat gets better.   Drink enough water and fluids to keep your urine clear or pale yellow. This will help prevent dehydration.   Get plenty of rest.   Stay home from school, daycare, or work until you have been on antibiotics for 24 hours.   Only take over-the-counter or prescription medicines for pain, discomfort, or fever as directed by your caregiver.   If antibiotics are prescribed, take them as directed. Finish them even if you start to feel better.  SEEK MEDICAL CARE IF:    The glands in your neck continue to enlarge.   You develop a rash, cough, or earache.   You cough up green, yellow-brown, or bloody sputum.   You have pain or discomfort not controlled by medicines.   Your  problems seem to be getting worse rather than better.  SEEK IMMEDIATE MEDICAL CARE IF:    You develop any new symptoms such as vomiting, severe headache, stiff or painful neck, chest pain, shortness of breath, or trouble swallowing.   You develop severe throat pain, drooling, or changes in your voice.   You develop swelling of the neck, or the skin on the neck becomes red and tender.   You have a fever.   You develop signs of dehydration, such as fatigue, dry mouth, and decreased urination.   You become increasingly sleepy, or you cannot wake up completely.  Document Released: 09/22/2000 Document Revised: 12/18/2011 Document Reviewed: 11/24/2010 [facility] Patient Information  2013 [place], [place].

## 2012-10-03 NOTE — Progress Notes (Signed)
  Subjective:    Patient ID: Sonya Brady, female    DOB: [birth year], 61 y.o.   MRN: [id number]  HPI 4 day h/o severe sore throat, fever, myalgia. She has had a hard time eating and drinking and sleeping due to pain and discomfort. Her granddaughter was visiting and was on antibiotics for strep throat.  PMH, FamHx and SocHx reviewed for any changes and relevance. Current Outpatient Prescriptions on File Prior to Visit  Medication Sig Dispense Refill  . Calcium Carbonate-Vitamin D (CALCIUM + D PO) Take by mouth.      . esomeprazole (NEXIUM) 40 MG capsule Take 40 mg by mouth daily before breakfast.      . famotidine (PEPCID) 20 MG tablet Take 20 mg by mouth daily as needed. Before Meals.      Marland Kitchen lisinopril-hydrochlorothiazide (PRINZIDE,ZESTORETIC) 20-12.5 MG per tablet TAKE 1 TABLET BY MOUTH ONCE DAILY  90 tablet  1  . Multiple Vitamins-Minerals (MULTIVITAMIN PO) Take by mouth.      . simvastatin (ZOCOR) 40 MG tablet TAKE 1 TABLET BY MOUTH ONCE DAILY  90 tablet  2  . SYNTHROID 112 MCG tablet TAKE 1 TABLET BY MOUTH ONCE DAILY  90 tablet  2  . traMADol (ULTRAM) 50 MG tablet Take 2 tablets (100 mg total) by mouth every 8 (eight) hours as needed for pain.  30 tablet  0  . amoxicillin-clavulanate (AUGMENTIN) 875-125 MG per tablet Take 1 tablet by mouth 2 (two) times daily.  14 tablet  0      Review of Systems System review is negative for any constitutional, cardiac, pulmonary, GI or neuro symptoms or complaints other than as described in the HPI.     Objective:   Physical Exam Filed Vitals:   10/03/12 1636  BP: 132/80  Pulse: 113  Temp: 100.3 F (37.9 C)  Resp: 12   Gen'l - WNWD white woman in no acute distress but uncomfortable HEENT- Sinus tenderness, throat with erythema and exudate at the tonsilar fossa. Chest - clear Cor - RRR Nodes - negative        Assessment & Plan:  Pharyngitis - also sinus involvement with pressure.  Plan - augmentin 875 mg twice a day for 10  days  Hydration with beverage of choice  Viscous Xylocaine: mix 1:1 with water, gargle and spit as needed for throat pain. Caution about numbing of the mouth and risk of thermal injury  Continue sudafed 30 mg two or three times a day  Robitussin DM 1 tsp every 4 hours as needed for cough  Tylenol 1,000 mg three times a day for aches and fever  May take ibuprofen 200 mg two tablets 3 times a day for uncontrolled pain.

## 2012-11-08 ENCOUNTER — Encounter: Payer: Self-pay | Admitting: Internal Medicine

## 2012-11-08 ENCOUNTER — Telehealth: Payer: Self-pay | Admitting: Internal Medicine

## 2012-11-08 ENCOUNTER — Ambulatory Visit (INDEPENDENT_AMBULATORY_CARE_PROVIDER_SITE_OTHER): Payer: PRIVATE HEALTH INSURANCE | Admitting: Internal Medicine

## 2012-11-08 VITALS — BP 148/80 | HR 84 | Temp 100.2°F | Resp 16 | Wt 173.0 lb

## 2012-11-08 DIAGNOSIS — R109 Unspecified abdominal pain: Secondary | ICD-10-CM

## 2012-11-08 DIAGNOSIS — K5792 Diverticulitis of intestine, part unspecified, without perforation or abscess without bleeding: Secondary | ICD-10-CM | POA: Insufficient documentation

## 2012-11-08 DIAGNOSIS — K5732 Diverticulitis of large intestine without perforation or abscess without bleeding: Secondary | ICD-10-CM

## 2012-11-08 MED ORDER — AMOXICILLIN-POT CLAVULANATE 875-125 MG PO TABS: 1.0000 | ORAL_TABLET | Freq: Two times a day (BID) | ORAL | Status: DC

## 2012-11-08 MED ORDER — PROMETHAZINE HCL 12.5 MG PO TABS: 12.5000 mg | ORAL_TABLET | Freq: Four times a day (QID) | ORAL | Status: DC | PRN

## 2012-11-08 MED ORDER — METRONIDAZOLE 500 MG PO TABS: 500.0000 mg | ORAL_TABLET | Freq: Three times a day (TID) | ORAL | Status: DC

## 2012-11-08 NOTE — Telephone Encounter (Signed)
Patient Information:  Caller Name: Cuca  Phone: [phone]  Patient: Sonya Brady, Sonya Brady  Gender: Female  DOB: [birth year]  Age: 62 Years  PCP: Illene Regulus (Adults only)  Office Follow Up:  Does the office need to follow up with this patient?: Yes  Instructions For The Office: Patient scheduled by office staff to be there by 16:30 for evaluation   Symptoms  Reason For Call & Symptoms: Patient states a "flare up of Diverticulitis" onset yesterday , 11/07/12.  Pain in lower right and left abdominal discomfort. Slight bloating, no n/v/d. She states she ate strawberries and blueberries that caused the flare up.  She tried to refill her antibotic for Diverticulities but could not get it filled.  07/2012- she was given Augmentin for x7 days.  Reviewed Health History In EMR: Yes  Reviewed Medications In EMR: Yes  Reviewed Allergies In EMR: Yes  Reviewed Surgeries / Procedures: No  Date of Onset of Symptoms: 11/07/2012  Guideline(s) Used:  Abdominal Pain - Female  Disposition Per Guideline:   See Today in Office  Reason For Disposition Reached:   Age > 60 years  Advice Given:  Fluids:  Sip clear fluids only (e.g., water, flat soft drinks or 1/2 strength fruit juice) until the pain has been gone for over 2 hours. Then slowly return to a regular diet.  Diet:  Slowly advance diet from clear liquids to a bland diet  Avoid alcohol or caffeinated beverages  Avoid greasy or fatty foods.  Rest:  Lie down and rest until you feel better.  Pass a BM:  Sit on the toilet and try to pass a bowel movement (BM). Do not strain. This may relieve the pain if it is due to constipation or impending diarrhea.  Avoid NSAIDS and Aspirin  : Avoid any drug that can irritate the stomach lining and make the pain worse (especially aspirin and NSAIDs like ibuprofen).  Call Back If:  Abdominal pain is constant and present for more than 2 hours  Abdominal pains come and go and are present for more than 24  hours  You become worse.  Appointment Scheduled:  11/08/2012 16:30:00 Appointment Scheduled Provider:  Sonda Primes (Adults only)

## 2012-11-08 NOTE — Progress Notes (Signed)
Subjective:    Abdominal Pain This is a new problem. The current episode started yesterday. The onset quality is gradual. The problem occurs constantly. The problem has been gradually worsening. The pain is located in the LLQ and RLQ. The pain is at a severity of 4/10. The pain is moderate. The quality of the pain is colicky and dull. Pertinent negatives include no diarrhea, frequency, headaches, hematuria or nausea. She has tried acetaminophen for the symptoms. The treatment provided mild relief.    PMH, FamHx and SocHx reviewed for any changes and relevance.  Current Outpatient Prescriptions on File Prior to Visit  Medication Sig Dispense Refill  . Calcium Carbonate-Vitamin D (CALCIUM + D PO) Take by mouth.      . esomeprazole (NEXIUM) 40 MG capsule Take 40 mg by mouth daily before breakfast.      . famotidine (PEPCID) 20 MG tablet Take 20 mg by mouth daily as needed. Before Meals.      . lidocaine (XYLOCAINE) 2 % solution Take 20 mLs by mouth as needed for pain. Mix 1:1 with water. Gargle and spit. Caution - thermal mouth injury.  100 mL  0  . lisinopril-hydrochlorothiazide (PRINZIDE,ZESTORETIC) 20-12.5 MG per tablet TAKE 1 TABLET BY MOUTH ONCE DAILY  90 tablet  1  . Multiple Vitamins-Minerals (MULTIVITAMIN PO) Take by mouth.      . simvastatin (ZOCOR) 40 MG tablet TAKE 1 TABLET BY MOUTH ONCE DAILY  90 tablet  2  . SYNTHROID 112 MCG tablet TAKE 1 TABLET BY MOUTH ONCE DAILY  90 tablet  2  . traMADol (ULTRAM) 50 MG tablet Take 2 tablets (100 mg total) by mouth every 8 (eight) hours as needed for pain.  30 tablet  0  . promethazine (PHENERGAN) 12.5 MG tablet Take 1 tablet (12.5 mg total) by mouth every 6 (six) hours as needed for nausea.  20 tablet  0      Review of Systems  Constitutional: Positive for chills. Negative for activity change, appetite change, fatigue and unexpected weight change.  HENT: Negative for congestion, mouth sores and sinus pressure.   Eyes: Negative for visual  disturbance.  Respiratory: Negative for cough and chest tightness.   Gastrointestinal: Positive for abdominal pain. Negative for nausea and diarrhea.  Genitourinary: Negative for frequency, hematuria, difficulty urinating and vaginal pain.  Musculoskeletal: Negative for back pain and gait problem.  Skin: Negative for pallor and rash.  Neurological: Negative for dizziness, tremors, weakness, numbness and headaches.  Psychiatric/Behavioral: Negative for suicidal ideas, confusion and sleep disturbance.        Objective:   Physical Exam  Constitutional: She appears well-developed. No distress.  HENT:  Head: Normocephalic.  Right Ear: External ear normal.  Left Ear: External ear normal.  Nose: Nose normal.  Mouth/Throat: Oropharynx is clear and moist.  Eyes: Conjunctivae normal are normal. Pupils are equal, round, and reactive to light. Right eye exhibits no discharge. Left eye exhibits no discharge.  Neck: Normal range of motion. Neck supple. No JVD present. No tracheal deviation present. No thyromegaly present.  Cardiovascular: Normal rate, regular rhythm and normal heart sounds.   Pulmonary/Chest: No stridor. No respiratory distress. She has no wheezes.  Abdominal: Soft. Bowel sounds are normal. She exhibits no distension and no mass. There is tenderness (LLQ>RLQ). There is no rebound and no guarding.  Musculoskeletal: She exhibits no edema and no tenderness.  Lymphadenopathy:    She has no cervical adenopathy.  Neurological: She displays normal reflexes. No cranial nerve deficit.  She exhibits normal muscle tone. Coordination normal.  Skin: No rash noted. No erythema.  Psychiatric: She has a normal mood and affect. Her behavior is normal. Judgment and thought content normal.   Filed Vitals:   11/08/12 1638  BP: 148/80  Pulse: 84  Temp: 100.2 F (37.9 C)  Resp: 16          Assessment & Plan:

## 2012-11-08 NOTE — Assessment & Plan Note (Signed)
Labs  CT if not well

## 2012-11-08 NOTE — Assessment & Plan Note (Addendum)
Augmentin  Flagyl Old CT reviewed

## 2012-11-08 NOTE — Patient Instructions (Addendum)
Low residue diet

## 2012-11-08 NOTE — Telephone Encounter (Signed)
Bout of Diverticulitis, Chills. Call to voice mail, message left to call back.

## 2012-11-12 ENCOUNTER — Telehealth: Payer: Self-pay | Admitting: Internal Medicine

## 2012-11-12 NOTE — Telephone Encounter (Signed)
Patient Information:  Caller Name: Sonya Brady  Phone: [phone]  Patient: Sonya Brady, Sonya Brady  Gender: Female  DOB: Jun 04, 1951  Age: 62 Years  PCP: Illene Regulus (Adults only)  Office Follow Up:  Does the office need to follow up with this patient?: Yes  Instructions For The Office: OFFICE FOLLOW UP:  Please follow up with patient with appointment or further instructions.    Due to patient being on Augmentin and Flagyl unsure if patient appointment is still recommended or if medication changes need to be made due to diarrhea and increased risk of dehydration.  RN Note: Patient is drinking water. Instructed to include Gatorade to her fluid intake. Patient has also included yogurt in her diet. Loose stools x 12 in last 24 hours.  Mouth is dry with bitter taste. Mild dizziness when bending over. Patient is otherwise alert and oriented.  Symptoms  Reason For Call & Symptoms: Diarrhea on antibiotics (Metronidazole and Augmentin)  Reviewed Health History In EMR: Yes  Reviewed Medications In EMR: Yes  Reviewed Allergies In EMR: Yes  Reviewed Surgeries / Procedures: Yes  Date of Onset of Symptoms: 11/09/2012  Guideline(s) Used:  Diarrhea  Disposition Per Guideline:   Go to Office Now  Reason For Disposition Reached:   Age > 60 years and has had > 6 diarrhea stools in past 24 hours  Advice Given:  Fluids:  Drink more fluids, at least 8-10 glasses (8 oz or 240 ml) daily.  Supplement this with saltine crackers or soups to make certain that you are getting sufficient fluid and salt to meet your body's needs.  Avoid caffeinated beverages (Reason: caffeine is mildly dehydrating).  Call Back If:  Signs of dehydration occur (e.g., no urine for more than 12 hours, very dry mouth, lightheaded, etc.)  You become worse.

## 2012-11-12 NOTE — Telephone Encounter (Signed)
Please read phone note below and advise. 

## 2012-11-13 ENCOUNTER — Ambulatory Visit: Payer: PRIVATE HEALTH INSURANCE | Admitting: Internal Medicine

## 2012-11-13 ENCOUNTER — Telehealth: Payer: Self-pay | Admitting: *Deleted

## 2012-11-13 NOTE — Telephone Encounter (Signed)
Contacted pt and she states she is feeling much better today and does not feel she needs to come in. If things change, she will call us back.

## 2012-11-13 NOTE — Telephone Encounter (Signed)
Dr.AVP note 1/31 reviewed. Recommend f/u OV - may add to this AM schedule 11/13/12.

## 2012-11-27 ENCOUNTER — Ambulatory Visit (INDEPENDENT_AMBULATORY_CARE_PROVIDER_SITE_OTHER): Payer: PRIVATE HEALTH INSURANCE | Admitting: *Deleted

## 2012-11-27 DIAGNOSIS — Z2911 Encounter for prophylactic immunotherapy for respiratory syncytial virus (RSV): Secondary | ICD-10-CM

## 2012-11-27 DIAGNOSIS — Z23 Encounter for immunization: Secondary | ICD-10-CM

## 2013-01-06 ENCOUNTER — Ambulatory Visit (INDEPENDENT_AMBULATORY_CARE_PROVIDER_SITE_OTHER): Payer: PRIVATE HEALTH INSURANCE | Admitting: Internal Medicine

## 2013-01-06 ENCOUNTER — Encounter: Payer: Self-pay | Admitting: Internal Medicine

## 2013-01-06 VITALS — BP 118/72 | HR 90 | Temp 97.6°F

## 2013-01-06 DIAGNOSIS — J029 Acute pharyngitis, unspecified: Secondary | ICD-10-CM

## 2013-01-06 MED ORDER — CEFDINIR 300 MG PO CAPS: 300.0000 mg | ORAL_CAPSULE | Freq: Two times a day (BID) | ORAL | Status: DC

## 2013-01-06 NOTE — Progress Notes (Signed)
HPI  Pt presents to the clinic today with c/o sore throat, cough, and ear pain x 4 days. She has been running low grade fevers. She has been taking ibuprofen and dayquil which does not seem to really help. She has no history of allergies or asthma. She has been watching her granddaughter who has been sick with similar symptoms. She reports that this feel like what she had back in December.  Review of Systems      Past Medical History  Diagnosis Date  . Depression   . Hypertension   . Hypothyroidism   . Acne   . Breast mass, right   . GERD (gastroesophageal reflux disease)   . Hypercholesteremia     Family History  Problem Relation Age of Onset  . Other Father 62    deceased, unknown causes in 10-Mar-2023  . Hypertension Mother   . Other Mother 57    SBO, deceased  . Breast cancer Neg Hx   . Colon cancer Neg Hx   . Thyroid disease Brother   . Thyroid disease Sister     History   Social History  . Marital Status: Single    Spouse Name: N/A    Number of Children: N/A  . Years of Education: N/A   Occupational History  . bookkeeper    Social History Main Topics  . Smoking status: Never Smoker   . Smokeless tobacco: Not on file  . Alcohol Use: No  . Drug Use: No  . Sexually Active: Not on file   Other Topics Concern  . Not on file   Social History Narrative   Married '72-widowed 03/09/1996   HSG   1 daughter 1980/03/09, 1 son '50: son going thru divorce ['09]; applying to medical school   Work: bookkeeper [employer]    No Known Allergies   Constitutional: Positive headache, fatigue and fever. Denies abrupt weight changes.  HEENT:  Positive sore throat and ear pain. Denies eye redness, eye pain, pressure behind the eyes, facial pain, nasal congestion, ringing in the ears, wax buildup, runny nose or bloody nose. Respiratory: Positive cough. Denies difficulty breathing or shortness of breath.  Cardiovascular: Denies chest pain, chest tightness, palpitations or swelling in the  hands or feet.   No other specific complaints in a complete review of systems (except as listed in HPI above).  Objective:   BP 118/72  Pulse 90  Temp(Src) 97.6 F (36.4 C) (Oral)  SpO2 96% Wt Readings from Last 3 Encounters:  11/08/12 173 lb (78.472 kg)  10/03/12 177 lb 1.3 oz (80.323 kg)  07/23/12 173 lb (78.472 kg)     General: Appears her stated age, well developed, well nourished in NAD. HEENT: Head: normal shape and size; Eyes: sclera white, no icterus, conjunctiva pink, PERRLA and EOMs intact; Rgiht Ears: Tm's gray and intact, normal light reflex Left ear: inflamed with tube noted protruding through eardrum; Nose: mucosa pink and moist, septum midline; Throat/Mouth: + PND. Teeth present, mucosa erythematous and moist, no exudate noted, no lesions or ulcerations noted.  Neck: Mild tonsillar lymphadenopathy. Neck supple, trachea midline. No massses, lumps or thyromegaly present.  Cardiovascular: Normal rate and rhythm. S1,S2 noted.  No murmur, rubs or gallops noted. No JVD or BLE edema. No carotid bruits noted. Pulmonary/Chest: Normal effort and positive vesicular breath sounds. No respiratory distress. No wheezes, rales or ronchi noted.      Assessment & Plan:   Acute pharyngitis, new onset:  Get some rest and drink plenty  of water Do salt water gargles for the sore throat Omnicef BID x 10 days May benefit from taking an OTC antihistamine like zyrtec for PND Continue Ibuprofen for pain  RTC as needed or if symptoms persist.

## 2013-01-06 NOTE — Patient Instructions (Signed)

## 2013-02-28 ENCOUNTER — Ambulatory Visit (INDEPENDENT_AMBULATORY_CARE_PROVIDER_SITE_OTHER): Payer: PRIVATE HEALTH INSURANCE | Admitting: Internal Medicine

## 2013-02-28 ENCOUNTER — Encounter: Payer: Self-pay | Admitting: Internal Medicine

## 2013-02-28 VITALS — BP 114/80 | HR 89 | Temp 98.2°F | Ht 66.0 in | Wt 176.5 lb

## 2013-02-28 DIAGNOSIS — K219 Gastro-esophageal reflux disease without esophagitis: Secondary | ICD-10-CM

## 2013-02-28 DIAGNOSIS — J019 Acute sinusitis, unspecified: Secondary | ICD-10-CM

## 2013-02-28 DIAGNOSIS — I10 Essential (primary) hypertension: Secondary | ICD-10-CM

## 2013-02-28 MED ORDER — LEVOFLOXACIN 250 MG PO TABS: 250.0000 mg | ORAL_TABLET | Freq: Every day | ORAL | Status: DC

## 2013-02-28 NOTE — Progress Notes (Signed)
Subjective:    Patient ID: Sonya Brady, female    DOB: [birth year], 62 y.o.   MRN: [id number]  HPI   Here with 2-3 days acute onset fever, facial pain, pressure, headache, general weakness and malaise, and greenish d/c, with mild ST and cough, but pt denies chest pain, wheezing, increased sob or doe, orthopnea, PND, increased LE swelling, palpitations, dizziness or syncope.   Pt denies polydipsia, polyuria. Pt denies new neurological symptoms such as new headache, or facial or extremity weakness or numbness  Denies worsening reflux, abd pain, dysphagia, n/v, bowel change or blood. Past Medical History  Diagnosis Date  . Depression   . Hypertension   . Hypothyroidism   . Acne   . Breast mass, right   . GERD (gastroesophageal reflux disease)   . Hypercholesteremia    Past Surgical History  Procedure Laterality Date  . Tympanoplasty      tube left ear  . Abdominal hysterectomy      both ovary intact    reports that she has never smoked. She does not have any smokeless tobacco history on file. She reports that she does not drink alcohol or use illicit drugs. family history includes Hypertension in her mother; Other (age of onset: 48) in her father; Other (age of onset: 82) in her mother; and Thyroid disease in her brother and sister.  There is no history of Breast cancer and Colon cancer. No Known Allergies Current Outpatient Prescriptions on File Prior to Visit  Medication Sig Dispense Refill  . Calcium Carbonate-Vitamin D (CALCIUM + D PO) Take by mouth.      . cefdinir (OMNICEF) 300 MG capsule Take 1 capsule (300 mg total) by mouth 2 (two) times daily.  20 capsule  0  . esomeprazole (NEXIUM) 40 MG capsule Take 40 mg by mouth daily before breakfast.      . famotidine (PEPCID) 20 MG tablet Take 20 mg by mouth daily as needed. Before Meals.      . lidocaine (XYLOCAINE) 2 % solution Take 20 mLs by mouth as needed for pain. Mix 1:1 with water. Gargle and spit. Caution - thermal mouth injury.   100 mL  0  . lisinopril-hydrochlorothiazide (PRINZIDE,ZESTORETIC) 20-12.5 MG per tablet TAKE 1 TABLET BY MOUTH ONCE DAILY  90 tablet  1  . Multiple Vitamins-Minerals (MULTIVITAMIN PO) Take by mouth.      . simvastatin (ZOCOR) 40 MG tablet TAKE 1 TABLET BY MOUTH ONCE DAILY  90 tablet  2  . SYNTHROID 112 MCG tablet TAKE 1 TABLET BY MOUTH ONCE DAILY  90 tablet  2  . traMADol (ULTRAM) 50 MG tablet Take 2 tablets (100 mg total) by mouth every 8 (eight) hours as needed for pain.  30 tablet  0   No current facility-administered medications on file prior to visit.   Review of Systems  Constitutional: Negative for unexpected weight change, or unusual diaphoresis  HENT: Negative for tinnitus.   Eyes: Negative for photophobia and visual disturbance.  Respiratory: Negative for choking and stridor.   Gastrointestinal: Negative for vomiting and blood in stool.  Genitourinary: Negative for hematuria and decreased urine volume.  Musculoskeletal: Negative for acute joint swelling Skin: Negative for color change and wound.  Neurological: Negative for tremors and numbness other than noted  Psychiatric/Behavioral: Negative for decreased concentration or  hyperactivity.       Objective:   Physical Exam BP 114/80  Pulse 89  Temp(Src) 98.2 F (36.8 C) (Oral)  Ht 5\' 6"  (1.676  m)  Wt 176 lb 8 oz (80.06 kg)  BMI 28.5 kg/m2  SpO2 95% VS noted, mild ill Constitutional: Pt appears well-developed and well-nourished.  HENT: Head: NCAT.  Right Ear: External ear normal.  Left Ear: External ear normal.  Bilat tm's with mild erythema.  Max sinus areas mild tender.  Pharynx with mild erythema, no exudate Eyes: Conjunctivae and EOM are normal. Pupils are equal, round, and reactive to light.  Neck: Normal range of motion. Neck supple.  Cardiovascular: Normal rate and regular rhythm.   Pulmonary/Chest: Effort normal and breath sounds normal.  Abd:  Soft, NT, non-distended, + BS Neurological: Pt is alert. Not  confused  Skin: Skin is warm. No erythema. No LE edema Assessment & Plan:

## 2013-02-28 NOTE — Assessment & Plan Note (Signed)
Mild to mod, for antibx course,  to f/u any worsening symptoms or concerns 

## 2013-02-28 NOTE — Patient Instructions (Signed)
Please take all new medication as prescribed Please continue all other medications as before You can also take Delsym OTC for cough, and/or Mucinex (or it's generic off brand) for congestion, and tylenol as needed for pain.   

## 2013-02-28 NOTE — Assessment & Plan Note (Signed)
stable overall by history and exam, recent data reviewed with pt, and pt to continue medical treatment as before,  to f/u any worsening symptoms or concerns Lab Results  Component Value Date   WBC 4.9 01/17/2010   HGB 12.5 01/17/2010   HCT 36.9 01/17/2010   PLT 268 01/17/2010   GLUCOSE 100* 07/23/2012   CHOL 163 07/23/2012   TRIG 69.0 07/23/2012   HDL 44.70 07/23/2012   LDLCALC 105* 07/23/2012   ALT 19 07/23/2012   ALT 19 07/23/2012   AST 17 07/23/2012   AST 17 07/23/2012   NA 135 07/23/2012   K 4.1 07/23/2012   CL 97 07/23/2012   CREATININE 0.7 07/23/2012   BUN 9 07/23/2012   CO2 28 07/23/2012   TSH 2.55 07/23/2012   HGBA1C  Value: 6.0 (NOTE) The ADA recommends the following therapeutic goal for glycemic control related to Hgb A1c measurement: Goal of therapy: <6.5 Hgb A1c  Reference: American Diabetes Association: Clinical Practice Recommendations 2010, Diabetes Care, 2010, 33: (Suppl  1). 01/17/2010

## 2013-02-28 NOTE — Assessment & Plan Note (Signed)
stable overall by history and exam, recent data reviewed with pt, and pt to continue medical treatment as before,  to f/u any worsening symptoms or concerns BP Readings from Last 3 Encounters:  02/28/13 114/80  01/06/13 118/72  11/08/12 148/80

## 2013-03-04 ENCOUNTER — Encounter: Payer: Self-pay | Admitting: Internal Medicine

## 2013-03-04 ENCOUNTER — Ambulatory Visit (INDEPENDENT_AMBULATORY_CARE_PROVIDER_SITE_OTHER): Payer: PRIVATE HEALTH INSURANCE | Admitting: Internal Medicine

## 2013-03-04 VITALS — BP 144/86 | HR 88 | Temp 98.0°F | Ht 66.0 in | Wt 175.4 lb

## 2013-03-04 DIAGNOSIS — I10 Essential (primary) hypertension: Secondary | ICD-10-CM

## 2013-03-04 DIAGNOSIS — R070 Pain in throat: Secondary | ICD-10-CM

## 2013-03-04 DIAGNOSIS — J019 Acute sinusitis, unspecified: Secondary | ICD-10-CM

## 2013-03-04 MED ORDER — PROMETHAZINE-CODEINE 6.25-10 MG/5ML PO SYRP: 5.0000 mL | ORAL_SOLUTION | Freq: Four times a day (QID) | ORAL | Status: DC | PRN

## 2013-03-04 MED ORDER — LIDOCAINE VISCOUS 2 % MT SOLN: 10.0000 mL | OROMUCOSAL | Status: DC | PRN

## 2013-03-04 NOTE — Progress Notes (Signed)
Subjective:    Patient ID: Sonya Brady, female    DOB: [birth year], 62 y.o.   MRN: [id number]  HPI Pt seen 5/23 for fever, sinus tenderness and sore throat. She was treated for URI with levofloxin but she has continued to be symptomatic with sore throat, swollen tonsill and sinus congestion. She has odynophagia. No fever or rigors, no N/V. No mucopurulent drainage. She has been taking Mucinex fast-maqx ( APAP, guafenesin, phenylephrine. She did take 500 mg levofloxicin this AM.   Past Medical History  Diagnosis Date  . Depression   . Hypertension   . Hypothyroidism   . Acne   . Breast mass, right   . GERD (gastroesophageal reflux disease)   . Hypercholesteremia    Past Surgical History  Procedure Laterality Date  . Tympanoplasty      tube left ear  . Abdominal hysterectomy      both ovary intact   Family History  Problem Relation Age of Onset  . Other Father 87    deceased, unknown causes in Mar 18, 2023  . Hypertension Mother   . Other Mother 33    SBO, deceased  . Breast cancer Neg Hx   . Colon cancer Neg Hx   . Thyroid disease Brother   . Thyroid disease Sister    History   Social History  . Marital Status: Single    Spouse Name: N/A    Number of Children: N/A  . Years of Education: N/A   Occupational History  . bookkeeper    Social History Main Topics  . Smoking status: Never Smoker   . Smokeless tobacco: Not on file  . Alcohol Use: No  . Drug Use: No  . Sexually Active: Not on file   Other Topics Concern  . Not on file   Social History Narrative   Married '72-widowed Mar 17, 1996   HSG   1 daughter March 17, 1980, 1 son '62: son going thru divorce ['09]; applying to medical school   Work: bookkeeper [employer]    Current Outpatient Prescriptions on File Prior to Visit  Medication Sig Dispense Refill  . Calcium Carbonate-Vitamin D (CALCIUM + D PO) Take by mouth.      . cefdinir (OMNICEF) 300 MG capsule Take 1 capsule (300 mg total) by mouth 2 (two) times daily.  20  capsule  0  . esomeprazole (NEXIUM) 40 MG capsule Take 40 mg by mouth daily before breakfast.      . famotidine (PEPCID) 20 MG tablet Take 20 mg by mouth daily as needed. Before Meals.      Marland Kitchen levofloxacin (LEVAQUIN) 250 MG tablet Take 1 tablet (250 mg total) by mouth daily.  10 tablet  0  . lidocaine (XYLOCAINE) 2 % solution Take 20 mLs by mouth as needed for pain. Mix 1:1 with water. Gargle and spit. Caution - thermal mouth injury.  100 mL  0  . lisinopril-hydrochlorothiazide (PRINZIDE,ZESTORETIC) 20-12.5 MG per tablet TAKE 1 TABLET BY MOUTH ONCE DAILY  90 tablet  1  . Multiple Vitamins-Minerals (MULTIVITAMIN PO) Take by mouth.      . simvastatin (ZOCOR) 40 MG tablet TAKE 1 TABLET BY MOUTH ONCE DAILY  90 tablet  2  . SYNTHROID 112 MCG tablet TAKE 1 TABLET BY MOUTH ONCE DAILY  90 tablet  2  . traMADol (ULTRAM) 50 MG tablet Take 2 tablets (100 mg total) by mouth every 8 (eight) hours as needed for pain.  30 tablet  0   No current facility-administered medications on  file prior to visit.     Review of Systems System review is negative for any constitutional, cardiac, pulmonary, GI or neuro symptoms or complaints other than as described in the HPI.     Objective:   Physical Exam Filed Vitals:   03/04/13 1624  BP: 144/86  Pulse: 88  Temp: 98 F (36.7 C)   gen'l -WNWD white woman in no distress HEENT- [place]/AT, right TM normal, left TM with tympanostomy tube but no inflammation. Right tonsil mildly enlarged but not inflammed, no exudate. Posterior pharynx clear Nodes - negative Lungs clear.       Assessment & Plan:

## 2013-03-04 NOTE — Patient Instructions (Addendum)
Sore throat - at today's exam there is no visible infection of the tonsil, no fever and no enlarged lymph nodes. The levofloxicin (very powerful) has most likely taken care of the bacterial infection.  Plan Finish levofloxicin as prescribed.  OK to continue the Mucinex fast max as long as it doesn't make you too jittery  Phenergan with codeine for cough - 1 tsp every 6 hours as needed.  Viscous xylocain: mix 1:1 with water, gargle and spit. Your mouth will be numb - be careful about hot liquids or biting tongue or cheek.  For recurrent fever or failure to improve use Mychart to send me a message.

## 2013-03-05 NOTE — Assessment & Plan Note (Addendum)
Recently seen and diagnosed with sinus infection. She has had continued symptoms and very sore throat with perceived tonsilar swelling and odynophagia. Exam is benign.   Sore throat - at today's exam there is no visible infection of the tonsil, no fever and no enlarged lymph nodes. The levofloxicin (very powerful) has most likely taken care of the bacterial infection.  Plan Finish levofloxicin as prescribed.  OK to continue the Mucinex fast max as long as it doesn't make you too jittery  Phenergan with codeine for cough - 1 tsp every 6 hours as needed.  Viscous xylocain: mix 1:1 with water, gargle and spit. Your mouth will be numb - be careful about hot liquids or biting tongue or cheek.  For recurrent fever or failure to improve use Mychart to send me a message.

## 2013-03-05 NOTE — Assessment & Plan Note (Signed)
BP Readings from Last 3 Encounters:  03/04/13 144/86  02/28/13 114/80  01/06/13 118/72

## 2013-03-13 ENCOUNTER — Telehealth: Payer: Self-pay | Admitting: Internal Medicine

## 2013-03-13 MED ORDER — FLUTICASONE PROPIONATE 50 MCG/ACT NA SUSP: 1.0000 | Freq: Two times a day (BID) | NASAL | Status: DC

## 2013-03-13 NOTE — Telephone Encounter (Signed)
Pt.notified

## 2013-03-13 NOTE — Telephone Encounter (Signed)
Rx sent to pharmacy for flonase nasal - this will take 1-2 days to open up the sinus passages.

## 2013-03-13 NOTE — Telephone Encounter (Signed)
Pt stated she was here for office visit 03/04/13, Dr. Debby Bud told pt to call back if pt still having pressure and stop up nose and he will give her something for these symptom.  Please advise.

## 2013-03-24 ENCOUNTER — Other Ambulatory Visit: Payer: Self-pay | Admitting: Internal Medicine

## 2013-05-17 ENCOUNTER — Encounter: Payer: Self-pay | Admitting: Family Medicine

## 2013-05-17 ENCOUNTER — Ambulatory Visit (INDEPENDENT_AMBULATORY_CARE_PROVIDER_SITE_OTHER): Payer: PRIVATE HEALTH INSURANCE | Admitting: Family Medicine

## 2013-05-17 VITALS — BP 118/74 | HR 100 | Temp 98.7°F | Ht 66.0 in | Wt 174.0 lb

## 2013-05-17 DIAGNOSIS — K5792 Diverticulitis of intestine, part unspecified, without perforation or abscess without bleeding: Secondary | ICD-10-CM

## 2013-05-17 DIAGNOSIS — R109 Unspecified abdominal pain: Secondary | ICD-10-CM

## 2013-05-17 DIAGNOSIS — K5732 Diverticulitis of large intestine without perforation or abscess without bleeding: Secondary | ICD-10-CM

## 2013-05-17 DIAGNOSIS — R110 Nausea: Secondary | ICD-10-CM

## 2013-05-17 DIAGNOSIS — R1032 Left lower quadrant pain: Secondary | ICD-10-CM

## 2013-05-17 DIAGNOSIS — I10 Essential (primary) hypertension: Secondary | ICD-10-CM

## 2013-05-17 LAB — POCT URINALYSIS DIPSTICK
Bilirubin, UA: NEGATIVE
Glucose, UA: NEGATIVE
Spec Grav, UA: 1.01

## 2013-05-17 MED ORDER — METRONIDAZOLE 500 MG PO TABS: 500.0000 mg | ORAL_TABLET | Freq: Three times a day (TID) | ORAL | Status: DC

## 2013-05-17 MED ORDER — CIPROFLOXACIN HCL 500 MG PO TABS: 500.0000 mg | ORAL_TABLET | Freq: Two times a day (BID) | ORAL | Status: DC

## 2013-05-17 MED ORDER — PROMETHAZINE HCL 25 MG PO TABS: 25.0000 mg | ORAL_TABLET | Freq: Three times a day (TID) | ORAL | Status: DC | PRN

## 2013-05-17 NOTE — Patient Instructions (Signed)
Start a probiotic such as Digestive Advantage daily for at least the next month. Rest the stomach by maintaining a bland diet with simple carbs and clear fluids for the next couple of days   Diverticulitis A diverticulum is a small pouch or sac on the colon. Diverticulosis is the presence of these diverticula on the colon. Diverticulitis is the irritation (inflammation) or infection of diverticula. CAUSES  The colon and its diverticula contain bacteria. If food particles block the tiny opening to a diverticulum, the bacteria inside can grow and cause an increase in pressure. This leads to infection and inflammation and is called diverticulitis. SYMPTOMS   Abdominal pain and tenderness. Usually, the pain is located on the left side of your abdomen. However, it could be located elsewhere.  Fever.  Bloating.  Feeling sick to your stomach (nausea).  Throwing up (vomiting).  Abnormal stools. DIAGNOSIS  Your caregiver will take a history and perform a physical exam. Since many things can cause abdominal pain, other tests may be necessary. Tests may include:  Blood tests.  Urine tests.  X-ray of the abdomen.  CT scan of the abdomen. Sometimes, surgery is needed to determine if diverticulitis or other conditions are causing your symptoms. TREATMENT  Most of the time, you can be treated without surgery. Treatment includes:  Resting the bowels by only having liquids for a few days. As you improve, you will need to eat a low-fiber diet.  Intravenous (IV) fluids if you are losing body fluids (dehydrated).  Antibiotic medicines that treat infections may be given.  Pain and nausea medicine, if needed.  Surgery if the inflamed diverticulum has burst. HOME CARE INSTRUCTIONS   Try a clear liquid diet (broth, tea, or water for as long as directed by your caregiver). You may then gradually begin a low-fiber diet as tolerated.  A low-fiber diet is a diet with less than 10 grams of fiber.  Choose the foods below to reduce fiber in the diet:  White breads, cereals, rice, and pasta.  Cooked fruits and vegetables or soft fresh fruits and vegetables without the skin.  Ground or well-cooked tender beef, ham, veal, lamb, pork, or poultry.  Eggs and seafood.  After your diverticulitis symptoms have improved, your caregiver may put you on a high-fiber diet. A high-fiber diet includes 14 grams of fiber for every 1000 calories consumed. For a standard 2000 calorie diet, you would need 28 grams of fiber. Follow these diet guidelines to help you increase the fiber in your diet. It is important to slowly increase the amount fiber in your diet to avoid gas, constipation, and bloating.  Choose whole-grain breads, cereals, pasta, and brown rice.  Choose fresh fruits and vegetables with the skin on. Do not overcook vegetables because the more vegetables are cooked, the more fiber is lost.  Choose more nuts, seeds, legumes, dried peas, beans, and lentils.  Look for food products that have greater than 3 grams of fiber per serving on the Nutrition Facts label.  Take all medicine as directed by your caregiver.  If your caregiver has given you a follow-up appointment, it is very important that you go. Not going could result in lasting (chronic) or permanent injury, pain, and disability. If there is any problem keeping the appointment, call to reschedule. SEEK MEDICAL CARE IF:   Your pain does not improve.  You have a hard time advancing your diet beyond clear liquids.  Your bowel movements do not return to normal. SEEK IMMEDIATE MEDICAL CARE  IF:   Your pain becomes worse.  You have an oral temperature above 102 F (38.9 C), not controlled by medicine.  You have repeated vomiting.  You have bloody or black, tarry stools.  Symptoms that brought you to your caregiver become worse or are not getting better. MAKE SURE YOU:   Understand these instructions.  Will watch your  condition.  Will get help right away if you are not doing well or get worse. Document Released: 07/05/2005 Document Revised: 12/18/2011 Document Reviewed: 10/31/2010 [facility] Patient Information 2014 [place], [place].

## 2013-05-17 NOTE — Assessment & Plan Note (Signed)
Well controlled despite pain and acute illness

## 2013-05-17 NOTE — Assessment & Plan Note (Addendum)
Worsening suprapubic pain and LLQ over the past 5-6 days, with anorexia. No major tenderness on abdominal palpation and good bowel sounds. Likely early Diverticulitis, will start on Cipro and flagyl and probiotics. Rest the GI tract with simple carbs and clear fluids as tolerated and seek further care if symptoms worsen. Urine dip shows only scant hematuria

## 2013-05-17 NOTE — Progress Notes (Signed)
Sonya Brady [id number] [birth year] 05/17/2013      Progress Note-Follow Up  Subjective  Chief Complaint  Chief Complaint  Patient presents with  . Diverticulitis    flare up    HPI  Patient is a 62 year old female who is in today complaining of recurrent diverticulitis. She has documented history of recurrent diverticulitis and has been struggling with increased abdominal pain over the last couple of days. She's been struggling with suprapubic discomfort but also left lower quadrant and left lower back pain. Has had some low-grade fevers and chills. Notes malaise as well as anorexia and some heartburn for the last 2 days as well. Bowels are moving relatively well. No constipation or diarrhea. No bloody stool. No chest pain, palpitations shortness or breath noted. Does note some urinary hesitancy but no dysuria.  Past Medical History  Diagnosis Date  . Depression   . Hypertension   . Hypothyroidism   . Acne   . Breast mass, right   . GERD (gastroesophageal reflux disease)   . Hypercholesteremia     Past Surgical History  Procedure Laterality Date  . Tympanoplasty      tube left ear  . Abdominal hysterectomy      both ovary intact    Family History  Problem Relation Age of Onset  . Other Father 48    deceased, unknown causes in 2023/03/17  . Hypertension Mother   . Other Mother 50    SBO, deceased  . Breast cancer Neg Hx   . Colon cancer Neg Hx   . Thyroid disease Brother   . Thyroid disease Sister     History   Social History  . Marital Status: Single    Spouse Name: N/A    Number of Children: N/A  . Years of Education: N/A   Occupational History  . bookkeeper    Social History Main Topics  . Smoking status: Never Smoker   . Smokeless tobacco: Not on file  . Alcohol Use: No  . Drug Use: No  . Sexually Active: Not on file   Other Topics Concern  . Not on file   Social History Narrative   Married '72-widowed March 16, 1996   HSG   1 daughter 03-16-1980, 1 son '72: son  going thru divorce ['09]; applying to medical school   Work: bookkeeper [employer]    Current Outpatient Prescriptions on File Prior to Visit  Medication Sig Dispense Refill  . Calcium Carbonate-Vitamin D (CALCIUM + D PO) Take by mouth.      . esomeprazole (NEXIUM) 40 MG capsule Take 40 mg by mouth daily before breakfast.      . famotidine (PEPCID) 20 MG tablet Take 20 mg by mouth daily as needed. Before Meals.      . fluticasone (FLONASE) 50 MCG/ACT nasal spray Place 1 spray into the nose 2 (two) times daily.  16 g  6  . levofloxacin (LEVAQUIN) 250 MG tablet Take 1 tablet (250 mg total) by mouth daily.  10 tablet  0  . lidocaine (XYLOCAINE) 2 % solution Take 10 mLs by mouth as needed for pain. Mix 1:1 with water. Gargle and spit. Caution - thermal mouth injury.  100 mL  0  . lisinopril-hydrochlorothiazide (PRINZIDE,ZESTORETIC) 20-12.5 MG per tablet TAKE 1 TABLET BY MOUTH ONCE DAILY  90 tablet  1  . Multiple Vitamins-Minerals (MULTIVITAMIN PO) Take by mouth.      . simvastatin (ZOCOR) 40 MG tablet TAKE 1 TABLET BY MOUTH ONCE DAILY  90  tablet  2  . SYNTHROID 112 MCG tablet TAKE 1 TABLET BY MOUTH ONCE DAILY  90 tablet  2  . traMADol (ULTRAM) 50 MG tablet Take 2 tablets (100 mg total) by mouth every 8 (eight) hours as needed for pain.  30 tablet  0   No current facility-administered medications on file prior to visit.    No Known Allergies  Review of Systems  Review of Systems  Constitutional: Positive for fever, chills and malaise/fatigue.  HENT: Negative for congestion.   Eyes: Negative for pain and discharge.  Respiratory: Negative for shortness of breath.   Cardiovascular: Negative for chest pain, palpitations and leg swelling.  Gastrointestinal: Positive for heartburn, nausea and abdominal pain. Negative for diarrhea, constipation, blood in stool and melena.  Genitourinary: Negative for dysuria.  Musculoskeletal: Positive for back pain. Negative for falls.  Skin: Negative for  rash.  Neurological: Negative for loss of consciousness and headaches.  Endo/Heme/Allergies: Negative for polydipsia.  Psychiatric/Behavioral: Negative for depression and suicidal ideas. The patient is not nervous/anxious and does not have insomnia.     Objective  BP 118/74  Pulse 100  Temp(Src) 98.7 F (37.1 C) (Oral)  Ht 5\' 6"  (1.676 m)  Wt 174 lb 0.6 oz (78.944 kg)  BMI 28.1 kg/m2  SpO2 97%  Physical Exam  Physical Exam  Constitutional: She is oriented to person, place, and time and well-developed, well-nourished, and in no distress. No distress.  HENT:  Head: Normocephalic and atraumatic.  Eyes: Conjunctivae are normal.  Neck: Neck supple. No thyromegaly present.  Cardiovascular: Normal rate, regular rhythm and normal heart sounds.   No murmur heard. Pulmonary/Chest: Effort normal and breath sounds normal. She has no wheezes.  Abdominal: Soft. Bowel sounds are normal. She exhibits no distension and no mass. There is no tenderness. There is no rebound and no guarding.  Musculoskeletal: She exhibits no edema.  Lymphadenopathy:    She has no cervical adenopathy.  Neurological: She is alert and oriented to person, place, and time.  Skin: Skin is warm and dry. No rash noted. She is not diaphoretic.  Psychiatric: Memory, affect and judgment normal.    Lab Results  Component Value Date   TSH 2.55 07/23/2012   Lab Results  Component Value Date   WBC 4.9 01/17/2010   HGB 12.5 01/17/2010   HCT 36.9 01/17/2010   MCV 87.1 01/17/2010   PLT 268 01/17/2010   Lab Results  Component Value Date   CREATININE 0.7 07/23/2012   BUN 9 07/23/2012   NA 135 07/23/2012   K 4.1 07/23/2012   CL 97 07/23/2012   CO2 28 07/23/2012   Lab Results  Component Value Date   ALT 19 07/23/2012   ALT 19 07/23/2012   AST 17 07/23/2012   AST 17 07/23/2012   ALKPHOS 60 07/23/2012   ALKPHOS 60 07/23/2012   BILITOT 0.7 07/23/2012   BILITOT 0.7 07/23/2012   Lab Results  Component Value Date    CHOL 163 07/23/2012   Lab Results  Component Value Date   HDL 44.70 07/23/2012   Lab Results  Component Value Date   LDLCALC 105* 07/23/2012   Lab Results  Component Value Date   TRIG 69.0 07/23/2012   Lab Results  Component Value Date   CHOLHDL 4 07/23/2012     Assessment & Plan  HYPERTENSION Well controlled despite pain and acute illness  Diverticulitis Worsening suprapubic pain and LLQ over the past 5-6 days, with anorexia. No major tenderness on abdominal  palpation and good bowel sounds. Likely early Diverticulitis, will start on Cipro and flagyl and probiotics. Rest the GI tract with simple carbs and clear fluids as tolerated and seek further care if symptoms worsen. Urine dip shows only scant hematuria

## 2013-05-19 ENCOUNTER — Telehealth: Payer: Self-pay | Admitting: *Deleted

## 2013-05-19 NOTE — Telephone Encounter (Signed)
[facility] Triage Call Report Triage Record Num: [id number] Operator: Craig Guess Patient Name: Sonya Brady Call Date & Time: 05/16/2013 8:03:10PM Patient Phone: [phone] PCP: Illene Regulus Patient Gender: Female PCP Fax : [phone] Patient DOB: 11/07/1950 Practice Name: [facility] Reason for Call: Caller: Sonya Brady/Patient; PCP: Illene Regulus (Adults only); CB#: [phone]; Call regarding Diverticulitis Flare Up; Caller asking for an appointment for Sat. 8/9 reporting abd pain for the past 2 days. Frequent stools. This evening, she developed chills and had fever of 100. Reduced with Tylenol 500 mg and Ibuprofen 200mg . Abd pain is improved as well. Caller requesting an appointment only at this time. Appointment scheduled for Sat 8/9 at 09:30 with Dr Rogelia Rohrer at [place] location. Caller is agreeable and was advised to callback as needed. She is agreeable. Protocol(s) Used: PCP Calls, No Triage (Adult) Recommended Outcome per Protocol: Call Provider within 72 Hours Override Outcome if Used in Protocol: See Provider within 24 hours RN Reason for Override Outcome: Nursing Judgement Used. Reason for Outcome: Caller requesting an appointment, triage offered and declined Care Advice: ~ 08/

## 2013-05-20 ENCOUNTER — Telehealth: Payer: Self-pay

## 2013-05-20 ENCOUNTER — Ambulatory Visit (INDEPENDENT_AMBULATORY_CARE_PROVIDER_SITE_OTHER): Payer: PRIVATE HEALTH INSURANCE | Admitting: Internal Medicine

## 2013-05-20 ENCOUNTER — Encounter: Payer: Self-pay | Admitting: Internal Medicine

## 2013-05-20 VITALS — BP 134/80 | HR 97 | Temp 98.6°F | Wt 170.0 lb

## 2013-05-20 DIAGNOSIS — K219 Gastro-esophageal reflux disease without esophagitis: Secondary | ICD-10-CM

## 2013-05-20 DIAGNOSIS — M25512 Pain in left shoulder: Secondary | ICD-10-CM

## 2013-05-20 DIAGNOSIS — R109 Unspecified abdominal pain: Secondary | ICD-10-CM

## 2013-05-20 DIAGNOSIS — M25519 Pain in unspecified shoulder: Secondary | ICD-10-CM

## 2013-05-20 NOTE — Telephone Encounter (Signed)
OV today. EGK on arrival.

## 2013-05-20 NOTE — Telephone Encounter (Signed)
Phone call from patient stating she has been having left shoulder pain (aching) that's progressively getting worse x 2 days. No injury. She rates her pain between 6-7. She states she takes enough medications so she would not like to be placed on any meds. Her concern is also she has a brother that had a heart attack and his only symptom was shoulder pain. Please advise. Thanks.

## 2013-05-20 NOTE — Progress Notes (Signed)
Subjective:     Patient ID: Sonya Brady, female   DOB: 1950/12/07, 62 y.o.   MRN: [id number]  Shoulder Pain    Sonya Brady is a 62 year-old lady with a several-year history of GERD, diverticulitis and hypertension who presents with severe retrosternal and neck pain since last Saturday and that it feels as if she's been having a severe episode of reflux.  She also states that she feels like there is pressure on the center of her back.  She has had small relief from the [employer] brand of Mylanta and minimal relief from ranitidine, though she has woken up at night with the same reflux sensation. She started taking metronidazole and ciprofloxacin for diverticulitis on Saturday and wonders if that may have precipitated this.  This episode is particularly concerning since her brother had a heart attack and his only symptom was shoulder pain.  She denies any dyspnea, chest pain, heart racing, dysphagia or halitosis.  Sonya Brady also describes some increasing difficulty swallowing solids.  This has been getting progressively worse over the past several years and only bothers her while eating too fast.  She denies having to regurgitate.   Review of Systems  Constitutional: Positive for diaphoresis and fatigue.       Diaphoresis a few times per day  No fever since Friday  Fatigue since Friday.  Respiratory: Positive for cough. Negative for choking, chest tightness and shortness of breath.        Dry cough due to ACEI  Cardiovascular: Negative for chest pain, palpitations and leg swelling.  Gastrointestinal: Positive for nausea and diarrhea. Negative for constipation.       Periumbilical  Nausea at night with lying down.       Objective:   Physical Exam  Cardiovascular: Normal rate, regular rhythm, normal heart sounds and intact distal pulses.  Exam reveals no gallop.   No murmur heard. EKG was unremarkable.  Pulmonary/Chest: Effort normal and breath sounds normal. No respiratory distress. She  has no wheezes. She has no rales. She exhibits no tenderness.  Abdomen - BS+ x 4 but hypoactive, no guarding or rebound, minimal tenderness at LLQ, mild tenderness epigastrium    Assessment:     Retrosternal pain: This is likely a GERD exacerbation.   Plan: She will be treated empirically with a PPI.  She was given a sample of Dexlansoprazole (Dexilant).  If this is successful, she will be prescribed a PPI.  She was also counseled to take liquid antacid for breakthrough pain.  Difficulty swallowing: Due to difficulty swallowing, she should receive an endoscopy. Plan: She will be referred to Dr. Jarold Motto at [facility] for an upper gastrointestinal endoscopy, since he did her colonoscopy in 2007.  Diverticulitis: This appears to be well managed. Plan: keep taking the antibiotics as prescribed by Dr. Abner Greenspan and follow up as needed.

## 2013-05-20 NOTE — Telephone Encounter (Signed)
Patient aware and transferred to front desk to schedule an appt

## 2013-05-20 NOTE — Patient Instructions (Addendum)
Thanks for working with me Sonya Brady) today!  Please sign up for a MyChart account to communicate with [place].  Reflux pain: This is likely a GERD exacerbation.   Plan: You will be treated empirically with a PPI.  You were given a sample of Dexlansoprazole (Dexilant).  If this is successful, you will be prescribed a PPI.  You can take liquid antacid for breakthrough pain.  Difficulty swallowing: Due to difficulty swallowing, you should receive an endoscopy. Plan: We will refer you to GI for an endoscopy.

## 2013-05-24 ENCOUNTER — Encounter: Payer: Self-pay | Admitting: Internal Medicine

## 2013-05-27 ENCOUNTER — Encounter: Payer: Self-pay | Admitting: Gastroenterology

## 2013-05-27 ENCOUNTER — Other Ambulatory Visit: Payer: Self-pay | Admitting: Internal Medicine

## 2013-05-27 DIAGNOSIS — R1310 Dysphagia, unspecified: Secondary | ICD-10-CM

## 2013-05-27 MED ORDER — OMEPRAZOLE 40 MG PO CPDR: 40.0000 mg | DELAYED_RELEASE_CAPSULE | Freq: Every day | ORAL | Status: DC

## 2013-06-17 ENCOUNTER — Ambulatory Visit (INDEPENDENT_AMBULATORY_CARE_PROVIDER_SITE_OTHER): Payer: PRIVATE HEALTH INSURANCE | Admitting: Gastroenterology

## 2013-06-17 ENCOUNTER — Encounter: Payer: Self-pay | Admitting: Gastroenterology

## 2013-06-17 VITALS — BP 100/70 | HR 83 | Ht 66.0 in | Wt 174.5 lb

## 2013-06-17 DIAGNOSIS — K5730 Diverticulosis of large intestine without perforation or abscess without bleeding: Secondary | ICD-10-CM

## 2013-06-17 DIAGNOSIS — Z90710 Acquired absence of both cervix and uterus: Secondary | ICD-10-CM

## 2013-06-17 DIAGNOSIS — R1310 Dysphagia, unspecified: Secondary | ICD-10-CM

## 2013-06-17 DIAGNOSIS — K219 Gastro-esophageal reflux disease without esophagitis: Secondary | ICD-10-CM

## 2013-06-17 DIAGNOSIS — R05 Cough: Secondary | ICD-10-CM

## 2013-06-17 DIAGNOSIS — R059 Cough, unspecified: Secondary | ICD-10-CM

## 2013-06-17 MED ORDER — DEXLANSOPRAZOLE 60 MG PO CPDR: 60.0000 mg | DELAYED_RELEASE_CAPSULE | Freq: Every day | ORAL | Status: DC

## 2013-06-17 NOTE — Patient Instructions (Addendum)
You have been scheduled for an endoscopy with propofol. Please follow written instructions given to you at your visit today. If you use inhalers (even only as needed), please bring them with you on the day of your procedure. Your physician has requested that you go to www.startemmi.com and enter the access code given to you at your visit today. This web site gives a general overview about your procedure. However, you should still follow specific instructions given to you by our office regarding your preparation for the procedure.  Information on Diverticulosis and Acid Reflux is below for your review.  Please stop Nexium and start Dexilant Prescription was sent to your pharmacy   Please purchase Benefiber over the counter and take one tablespoon twice daily ___________________________________________________________________________________  Diverticulosis Diverticulosis is a common condition that develops when small pouches (diverticula) form in the wall of the colon. The risk of diverticulosis increases with age. It happens more often in people who eat a low-fiber diet. Most individuals with diverticulosis have no symptoms. Those individuals with symptoms usually experience abdominal pain, constipation, or loose stools (diarrhea). HOME CARE INSTRUCTIONS   Increase the amount of fiber in your diet as directed by your caregiver or dietician. This may reduce symptoms of diverticulosis.  Your caregiver may recommend taking a dietary fiber supplement.  Drink at least 6 to 8 glasses of water each day to prevent constipation.  Try not to strain when you have a bowel movement.  Your caregiver may recommend avoiding nuts and seeds to prevent complications, although this is still an uncertain benefit.  Only take over-the-counter or prescription medicines for pain, discomfort, or fever as directed by your caregiver. FOODS WITH HIGH FIBER CONTENT INCLUDE:  Fruits. Apple, peach, pear, tangerine,  raisins, prunes.  Vegetables. Brussels sprouts, asparagus, broccoli, cabbage, carrot, cauliflower, romaine lettuce, spinach, summer squash, tomato, winter squash, zucchini.  Starchy Vegetables. Baked beans, kidney beans, lima beans, split peas, lentils, potatoes (with skin).  Grains. Whole wheat bread, brown rice, bran flake cereal, plain oatmeal, white rice, shredded wheat, bran muffins. SEEK IMMEDIATE MEDICAL CARE IF:   You develop increasing pain or severe bloating.  You have an oral temperature above 102 F (38.9 C), not controlled by medicine.  You develop vomiting or bowel movements that are bloody or black. Document Released: 06/22/2004 Document Revised: 12/18/2011 Document Reviewed: 02/23/2010 Exit[employer] Patient Information 2014 [employer], [place].   _____________________________________________________________________________________________________________________________  Diet for Gastroesophageal Reflux Disease, Adult Reflux (acid reflux) is when acid from your stomach flows up into the esophagus. When acid comes in contact with the esophagus, the acid causes irritation and soreness (inflammation) in the esophagus. When reflux happens often or so severely that it causes damage to the esophagus, it is called gastroesophageal reflux disease (GERD). Nutrition therapy can help ease the discomfort of GERD. FOODS OR DRINKS TO AVOID OR LIMIT  Smoking or chewing tobacco. Nicotine is one of the most potent stimulants to acid production in the gastrointestinal tract.  Caffeinated and decaffeinated coffee and black tea.  Regular or low-calorie carbonated beverages or energy drinks (caffeine-free carbonated beverages are allowed).   Strong spices, such as black pepper, white pepper, red pepper, cayenne, curry powder, and chili powder.  Peppermint or spearmint.  Chocolate.  High-fat foods, including meats and fried foods. Extra added fats including oils, butter, salad dressings, and  nuts. Limit these to less than 8 tsp per day.  Fruits and vegetables if they are not tolerated, such as citrus fruits or tomatoes.  Alcohol.  Any food  that seems to aggravate your condition. If you have questions regarding your diet, call your caregiver or a registered dietitian. OTHER THINGS THAT MAY HELP GERD INCLUDE:   Eating your meals slowly, in a relaxed setting.  Eating 5 to 6 small meals per day instead of 3 large meals.  Eliminating food for a period of time if it causes distress.  Not lying down until 3 hours after eating a meal.  Keeping the head of your bed raised 6 to 9 inches (15 to 23 cm) by using a foam wedge or blocks under the legs of the bed. Lying flat may make symptoms worse.  Being physically active. Weight loss may be helpful in reducing reflux in overweight or obese adults.  Wear loose fitting clothing EXAMPLE MEAL PLAN This meal plan is approximately 2,000 calories based on https://www.bern[facility].org/ meal planning guidelines. Breakfast   cup cooked oatmeal.  1 cup strawberries.  1 cup low-fat milk.  1 oz almonds. Snack  1 cup cucumber slices.  6 oz yogurt (made from low-fat or fat-free milk). Lunch  2 slice whole-wheat bread.  2 oz sliced [place].  2 tsp mayonnaise.  1 cup blueberries.  1 cup snap peas. Snack  6 whole-wheat crackers.  1 oz string cheese. Dinner   cup brown rice.  1 cup mixed veggies.  1 tsp olive oil.  3 oz grilled fish. Document Released: 09/25/2005 Document Revised: 12/18/2011 Document Reviewed: 08/11/2011 [facility] Patient Information 2014 [place], [place].

## 2013-06-17 NOTE — Progress Notes (Signed)
History of Present Illness:  This is a 62 year old Caucasian female referred for evaluation of solid food dysphagia for the last 2 years.  This patient has had typical acid reflux for many years, and has been taking over-the-counter PPI medication.  She recently has completed a two-week course of Cipro and metronidazole for recurrent diverticulitis.  Her last colonoscopy was in 2007.  She been having acid reflux with regurgitation, belching, burping, coughing, and intermittent solid food dysphagia worsening over the last several months.  She has not had a food impaction requiring emergency room visit.  Her appetite is good her weight is stable.  She does use when necessary antacids.  Currently she denies lower GI complaints, but was recently treated for documented diverticulitis by Dr. Debby Bud.  Patient has chronic functional constipation relieved in part by increased by mouth fiber.  She is status post hysterectomy for endometriosis.  Review shows no evidence of previous endoscopic exams.  She denies any hepatobiliary or lower GI complaints , her weight is stable and she denies any food intolerances.  She does complain of a daily dry nonproductive cough.  I have reviewed this patient's present history, medical and surgical past history, allergies and medications.    ROS:   All systems were reviewed and are negative unless otherwise stated in the HPI.    Physical Exam: At pressure 100/70, pulse 83 and regular and weight 174 pounds with a BMI of 28.18. General well developed well nourished patient in no acute distress, appearing their stated age Eyes PERRLA, no icterus, fundoscopic exam per opthamologist Skin no lesions noted Neck supple, no adenopathy, no thyroid enlargement, no tenderness Chest clear to percussion and auscultation Heart no significant murmurs, gallops or rubs noted Abdomen no hepatosplenomegaly masses or tenderness, BS normal.  Extremities no acute joint lesions, edema, phlebitis or  evidence of cellulitis. Neurologic patient oriented x 3, cranial nerves intact, no focal neurologic deficits noted. Psychological mental status normal and normal affect.  Assessment and plan: Chronic GERD with probable peptic stricture the esophagus.  I have placed her on Dexilant 60 mg a day and reviewed standard antireflux maneuvers.  We will schedule endoscopy with esophageal dilatation.  She been advised to continue on a high fiber diet with daily Benefiber for her recurrent diverticulitis.  She probably will need followup colonoscopy once her dysphagia has been addressed Ms. topically.  She currently is complete her Cipro and metronidazole and is asymptomatic in terms of diverticulitis symptoms.  There is no family history of colon carcinoma.

## 2013-06-18 ENCOUNTER — Encounter: Payer: Self-pay | Admitting: Gastroenterology

## 2013-06-18 ENCOUNTER — Ambulatory Visit (AMBULATORY_SURGERY_CENTER): Payer: PRIVATE HEALTH INSURANCE | Admitting: Gastroenterology

## 2013-06-18 VITALS — BP 127/78 | HR 80 | Temp 98.4°F | Resp 16 | Ht 66.0 in | Wt 174.0 lb

## 2013-06-18 DIAGNOSIS — R1310 Dysphagia, unspecified: Secondary | ICD-10-CM

## 2013-06-18 DIAGNOSIS — K2980 Duodenitis without bleeding: Secondary | ICD-10-CM

## 2013-06-18 DIAGNOSIS — D130 Benign neoplasm of esophagus: Secondary | ICD-10-CM

## 2013-06-18 DIAGNOSIS — K219 Gastro-esophageal reflux disease without esophagitis: Secondary | ICD-10-CM

## 2013-06-18 MED ORDER — SODIUM CHLORIDE 0.9 % IV SOLN: 500.0000 mL | INTRAVENOUS | Status: DC

## 2013-06-18 NOTE — Progress Notes (Signed)
Patient did not experience any of the following events: a burn prior to discharge; a fall within the facility; wrong site/side/patient/procedure/implant event; or a hospital transfer or hospital admission upon discharge from the facility. (G8907) Patient did not have preoperative order for IV antibiotic SSI prophylaxis. (G8918)  

## 2013-06-18 NOTE — Patient Instructions (Addendum)
YOU HAD AN ENDOSCOPIC PROCEDURE TODAY AT THE [facility]: Refer to the procedure report that was given to you for any specific questions about what was found during the examination.  If the procedure report does not answer your questions, please call your gastroenterologist to clarify.  If you requested that your care partner not be given the details of your procedure findings, then the procedure report has been included in a sealed envelope for you to review at your convenience later.  YOU SHOULD EXPECT: Some feelings of bloating in the abdomen. Passage of more gas than usual.  Walking can help get rid of the air that was put into your GI tract during the procedure and reduce the bloating. If you had a lower endoscopy (such as a colonoscopy or flexible sigmoidoscopy) you may notice spotting of blood in your stool or on the toilet paper. If you underwent a bowel prep for your procedure, then you may not have a normal bowel movement for a few days.  DIET: Your first meal following the procedure should be a light meal and then it is ok to progress to your normal diet.  A half-sandwich or bowl of soup is an example of a good first meal.  Heavy or fried foods are harder to digest and may make you feel nauseous or bloated.  Likewise meals heavy in dairy and vegetables can cause extra gas to form and this can also increase the bloating.  Drink plenty of fluids but you should avoid alcoholic beverages for 24 hours.  ACTIVITY: Your care partner should take you home directly after the procedure.  You should plan to take it easy, moving slowly for the rest of the day.  You can resume normal activity the day after the procedure however you should NOT DRIVE or use heavy machinery for 24 hours (because of the sedation medicines used during the test).    SYMPTOMS TO REPORT IMMEDIATELY: A gastroenterologist can be reached at any hour.  During normal business hours, 8:30 AM to 5:00 PM Monday through Friday,  call [phone].  After hours and on weekends, please call the GI answering service at [phone] who will take a message and have the physician on call contact you.  r  Following upper endoscopy (EGD)  Vomiting of blood or coffee ground material  New chest pain or pain under the shoulder blades  Painful or persistently difficult swallowing  New shortness of breath  Fever of 100F or higher  Black, tarry-looking stools  FOLLOW UP: If any biopsies were taken you will be contacted by phone or by letter within the next 1-3 weeks.  Call your gastroenterologist if you have not heard about the biopsies in 3 weeks.  Our staff will call the home number listed on your records the next business day following your procedure to check on you and address any questions or concerns that you may have at that time regarding the information given to you following your procedure. This is a courtesy call and so if there is no answer at the home number and we have not heard from you through the emergency physician on call, we will assume that you have returned to your regular daily activities without incident.  Await biopsy results.  Continue Nexium  SIGNATURES/CONFIDENTIALITY: You and/or your care partner have signed paperwork which will be entered into your electronic medical record.  These signatures attest to the fact that that the information above on your After Visit Summary has  been reviewed and is understood.  Full responsibility of the confidentiality of this discharge information lies with you and/or your care-partner.

## 2013-06-18 NOTE — Progress Notes (Signed)
Report to pacu rn, vss, bbs=clear 

## 2013-06-18 NOTE — Progress Notes (Signed)
Called to room to assist during endoscopic procedure.  Patient ID and intended procedure confirmed with present staff. Received instructions for my participation in the procedure from the performing physician.  

## 2013-06-18 NOTE — Op Note (Signed)
[facility] [address] [place], [address]   ENDOSCOPY PROCEDURE REPORT  PATIENT: Tausha, Milhoan  MR#: [id number] BIRTHDATE: 1951-04-16 , 62  yrs. old GENDER: Female ENDOSCOPIST:Marcellus Pulliam Hale Bogus, MD, [facility] REFERRED BY: PROCEDURE DATE:  06/18/2013 PROCEDURE:   EGD w/ biopsy , EGD w/ biopsy for H.pylori, and Maloney dilation of esophagus ASA CLASS:    Class II INDICATIONS: Dysphagia and History of reflux esophagitis. MEDICATION: Propofol (Diprivan) 170 mg IV TOPICAL ANESTHETIC:   Cetacaine Spray  DESCRIPTION OF PROCEDURE:   After the risks and benefits of the procedure were explained, informed consent was obtained.  The LB [id number]  endoscope was introduced through the mouth  and advanced to the second portion of the duodenum .  The instrument was slowly withdrawn as the mucosa was fully examined.      DUODENUM: Mild duodenal inflammation was found.  Erosions noted in bulb.  STOMACH: The mucosa of the stomach appeared normal.  A  CLO biopsy was performed.  ESOPHAGUS: The mucosa of the esophagus appeared normal.  Multiple biopsies were performed.    Retroflexed views revealed a small hiatal hernia.Dilated #61F Maloney dilator...     The scope was then withdrawn from the patient and the procedure completed.  COMPLICATIONS: There were no complications.   ENDOSCOPIC IMPRESSION: 1.   Duodenal inflammation was found..r/o H.pylori vs NSAID damage 2.   The mucosa of the stomach appeared normal; biopsy 3.   The mucosa of the esophagus appeared normal; multiple biopsies ..probale chronic GERD and occult stricture dilated,r/o eosinophilic esophagitis...  RECOMMENDATIONS: 1.  Await biopsy results 2.  Continue PPI 3.  Dilatations PRN4 4. Consider manometry if dysphagia persists   _______________________________ eSigned:  Mardella Layman, MD, [facility] 06/18/2013 11:37 AM   standard discharge   PATIENT NAME:  Sedalia, Greeson MR#: [id number]

## 2013-06-19 ENCOUNTER — Encounter: Payer: Self-pay | Admitting: Gastroenterology

## 2013-06-19 ENCOUNTER — Telehealth: Payer: Self-pay | Admitting: *Deleted

## 2013-06-19 MED ORDER — AMOXICILL-CLARITHRO-LANSOPRAZ PO MISC: Freq: Two times a day (BID) | ORAL | Status: DC

## 2013-06-19 NOTE — Telephone Encounter (Signed)
  Follow up Call-  Call back number 06/18/2013  Post procedure Call Back phone  # [phone]  Permission to leave phone message Yes     Patient questions:  Do you have a fever, pain , or abdominal swelling? no Pain Score  0 *  Have you tolerated food without any problems? yes  Have you been able to return to your normal activities? yes  Do you have any questions about your discharge instructions: Diet   no Medications  no Follow up visit  no  Do you have questions or concerns about your Care? no  Actions: * If pain score is 4 or above: No action needed, pain <4.

## 2013-06-19 NOTE — Telephone Encounter (Signed)
Informed pt of + H. Pylori and the need for a PrevPak. Pt had questions that I hope were answered and I informed her she should receive info with her script and she may call me back if she has questions. Pt stated understanding.

## 2013-06-30 ENCOUNTER — Other Ambulatory Visit: Payer: Self-pay | Admitting: Internal Medicine

## 2013-07-09 ENCOUNTER — Encounter: Payer: Self-pay | Admitting: Gastroenterology

## 2013-08-14 ENCOUNTER — Other Ambulatory Visit: Payer: Self-pay

## 2013-09-09 ENCOUNTER — Other Ambulatory Visit: Payer: Self-pay | Admitting: Internal Medicine

## 2013-09-09 ENCOUNTER — Telehealth: Payer: Self-pay

## 2013-09-09 NOTE — Telephone Encounter (Signed)
Phenergan w/codeine called to pharmacy

## 2013-09-09 NOTE — Telephone Encounter (Signed)
Phone call from patient stating she has a bad cold with coughing. Right tonsill swollen. I let her know we received a refill request for Promethazine with codeine and that was called in this morning.

## 2013-09-09 NOTE — Telephone Encounter (Signed)
For acute sore throat (swollen tonsil) with fever can add on today.

## 2013-09-09 NOTE — Telephone Encounter (Signed)
I spoke to patient and she states she does not have a fever. She does not want to be seen today. She will call if she feels she needs to be seen.

## 2013-09-10 ENCOUNTER — Ambulatory Visit (INDEPENDENT_AMBULATORY_CARE_PROVIDER_SITE_OTHER): Payer: PRIVATE HEALTH INSURANCE | Admitting: Internal Medicine

## 2013-09-10 ENCOUNTER — Encounter: Payer: Self-pay | Admitting: Internal Medicine

## 2013-09-10 VITALS — BP 102/70 | HR 99 | Temp 99.4°F

## 2013-09-10 DIAGNOSIS — R059 Cough, unspecified: Secondary | ICD-10-CM

## 2013-09-10 DIAGNOSIS — R05 Cough: Secondary | ICD-10-CM

## 2013-09-10 DIAGNOSIS — J039 Acute tonsillitis, unspecified: Secondary | ICD-10-CM

## 2013-09-10 MED ORDER — AMOXICILLIN 500 MG PO CAPS: 500.0000 mg | ORAL_CAPSULE | Freq: Three times a day (TID) | ORAL | Status: DC

## 2013-09-10 NOTE — Progress Notes (Signed)
Pre-visit discussion using our clinic review tool. No additional management support is needed unless otherwise documented below in the visit note.  

## 2013-09-10 NOTE — Progress Notes (Signed)
Subjective:    Patient ID: Sonya Brady, female    DOB: Jun 26, 1951, 62 y.o.   MRN: [id number]  Fever  This is a new problem. The current episode started yesterday. The problem occurs 2 to 4 times per day. The problem has been gradually worsening. The maximum temperature noted was 101 to 101.9 F. The temperature was taken using an oral thermometer. Associated symptoms include congestion, coughing, ear pain, muscle aches and a sore throat. Pertinent negatives include no abdominal pain, chest pain, diarrhea, headaches, nausea, rash, urinary pain, vomiting or wheezing. She has tried fluids and acetaminophen for the symptoms. The treatment provided mild relief.   Past Medical History  Diagnosis Date  . Depression   . Hypertension   . Hypothyroidism   . Acne   . Breast mass, right   . GERD (gastroesophageal reflux disease)   . Hypercholesteremia     Review of Systems  Constitutional: Positive for fever.  HENT: Positive for congestion, ear pain and sore throat.   Respiratory: Positive for cough. Negative for wheezing.   Cardiovascular: Negative for chest pain.  Gastrointestinal: Negative for nausea, vomiting, abdominal pain and diarrhea.  Skin: Negative for rash.  Neurological: Negative for headaches.       Objective:   Physical Exam  BP 102/70  Pulse 99  Temp(Src) 99.4 F (37.4 C) (Oral)  SpO2 96% Wt Readings from Last 3 Encounters:  06/18/13 174 lb (78.926 kg)  06/17/13 174 lb 8 oz (79.153 kg)  05/20/13 170 lb (77.111 kg)   Constitutional: She appears well-developed and well-nourished. No distress. nontoxic HENT: Head: Normocephalic and atraumatic. Sinus nontender Ears:blue tube in L TM, no drainage -R TM ok, no erythema or effusion; Nose: Nose normal. Mouth/Throat: Oropharynx is red, B tonsillitis but no oropharyngeal exudate.  Eyes: Conjunctivae and EOM are normal. Pupils are equal, round, and reactive to light. No scleral icterus.  Neck: Normal range of motion. Neck  supple. Mild LAD B, no JVD present. No thyromegaly present.  Cardiovascular: Normal rate, regular rhythm and normal heart sounds.  No murmur heard. No BLE edema. Pulmonary/Chest: Effort normal and breath sounds normal. No respiratory distress. She has no wheezes.  Abdominal: Soft. Bowel sounds are normal. She exhibits no distension. There is no tenderness. no masses Neurological: She is alert and oriented to person, place, and time. No cranial nerve deficit. Coordination, balance, strength, speech and gait are normal.  Skin: Skin is warm and dry. No rash noted. No erythema.  Psychiatric: She has a normal mood and affect. Her behavior is normal. Judgment and thought content normal.   Lab Results  Component Value Date   WBC 4.9 01/17/2010   HGB 12.5 01/17/2010   HCT 36.9 01/17/2010   PLT 268 01/17/2010   GLUCOSE 100* 07/23/2012   CHOL 163 07/23/2012   TRIG 69.0 07/23/2012   HDL 44.70 07/23/2012   LDLCALC 105* 07/23/2012   ALT 19 07/23/2012   ALT 19 07/23/2012   AST 17 07/23/2012   AST 17 07/23/2012   NA 135 07/23/2012   K 4.1 07/23/2012   CL 97 07/23/2012   CREATININE 0.7 07/23/2012   BUN 9 07/23/2012   CO2 28 07/23/2012   TSH 2.55 07/23/2012   HGBA1C  Value: 6.0 (NOTE) The ADA recommends the following therapeutic goal for glycemic control related to Hgb A1c measurement: Goal of therapy: <6.5 Hgb A1c  Reference: American Diabetes Association: Clinical Practice Recommendations 2010, Diabetes Care, 2010, 33: (Suppl  1). 01/17/2010  Assessment & Plan:   Fever Tonsillitis Cough  Amox Continue prometh w. Cod syrup tylenol

## 2013-09-10 NOTE — Patient Instructions (Addendum)
It was good to see you today.  Amoxicillin antibiotics and continue prescription cough syrup - Your prescription(s) have been submitted to your pharmacy. Please take as directed and contact our office if you believe you are having problem(s) with the medication(s).  Alternate between ibuprofen and tylenol for aches, pain and fever symptoms as discussed  Hydrate, rest and call if worse or unimproved   Tonsillitis Tonsillitis is an infection of the throat. This infection causes the tonsils to become red, tender, and puffy (swollen). Tonsils are groups of tissue at the back of your throat. If bacteria caused your infection, antibiotic medicine will be given to you. Sometimes symptoms of tonsillitis can be relieved with the use of steroid medicine. If your tonsillitis is severe and happens often, you may need to get your tonsils removed (tonsillectomy). HOME CARE   Rest and sleep often.  Drink enough fluids to keep your pee (urine) clear or pale yellow.  While your throat is sore, eat soft or liquid foods like:  Soup.  Ice cream.  Instant breakfast drinks.  Eat frozen ice pops.  Gargle with a warm or cold liquid to help soothe the throat. Gargle with a water and salt mix. Mix 1/4 teaspoon of salt and 1/4 teaspoon of baking soda in 1 cup of water.  Only take medicines as told by your doctor.  If you are given medicines (antibiotics), take them as told. Finish them even if you start to feel better. GET HELP RIGHT AWAY IF:   You throw up (vomit).  You have a very bad headache.  You have a stiff neck.  You have chest pain.  You have trouble breathing or swallowing.  You have bad throat pain, drooling, or your voice changes.  You have bad pain not helped by medicine.  You cannot fully open your mouth.  You have redness, puffiness, or bad pain in the neck.  You have a fever.  You have a rash.  You cough up green, yellow-brown, or bloody fluid.  You cannot swallow  liquids or food for 24 hours.  You notice that only one of your tonsils is swollen. MAKE SURE YOU:   Understand these instructions.  Will watch your condition.  Will get help right away if you are not doing well or get worse. Document Released: 03/13/2008 Document Revised: 05/28/2013 Document Reviewed: 03/14/2013 [facility] Patient Information 2014 [place], [place].

## 2013-09-12 ENCOUNTER — Other Ambulatory Visit: Payer: Self-pay | Admitting: Internal Medicine

## 2013-09-13 ENCOUNTER — Emergency Department (INDEPENDENT_AMBULATORY_CARE_PROVIDER_SITE_OTHER): Payer: PRIVATE HEALTH INSURANCE

## 2013-09-13 ENCOUNTER — Encounter (HOSPITAL_COMMUNITY): Payer: Self-pay | Admitting: Emergency Medicine

## 2013-09-13 ENCOUNTER — Emergency Department (HOSPITAL_COMMUNITY)
Admission: EM | Admit: 2013-09-13 | Discharge: 2013-09-13 | Disposition: A | Discharge status: Home / Self Care | Payer: PRIVATE HEALTH INSURANCE | Source: Home / Self Care | Attending: Emergency Medicine | Admitting: Emergency Medicine

## 2013-09-13 DIAGNOSIS — R05 Cough: Secondary | ICD-10-CM

## 2013-09-13 DIAGNOSIS — J039 Acute tonsillitis, unspecified: Secondary | ICD-10-CM

## 2013-09-13 DIAGNOSIS — H109 Unspecified conjunctivitis: Secondary | ICD-10-CM

## 2013-09-13 DIAGNOSIS — R059 Cough, unspecified: Secondary | ICD-10-CM

## 2013-09-13 MED ORDER — POLYMYXIN B-TRIMETHOPRIM 10000-0.1 UNIT/ML-% OP SOLN: 1.0000 [drp] | OPHTHALMIC | Status: DC

## 2013-09-13 MED ORDER — AMOXICILLIN-POT CLAVULANATE 875-125 MG PO TABS: 1.0000 | ORAL_TABLET | Freq: Two times a day (BID) | ORAL | Status: DC

## 2013-09-13 MED ORDER — PREDNISONE 20 MG PO TABS: 20.0000 mg | ORAL_TABLET | Freq: Two times a day (BID) | ORAL | Status: DC

## 2013-09-13 MED ORDER — HYDROCOD POLST-CHLORPHEN POLST 10-8 MG/5ML PO LQCR: 5.0000 mL | Freq: Two times a day (BID) | ORAL | Status: DC | PRN

## 2013-09-13 NOTE — ED Provider Notes (Signed)
Chief Complaint:   Chief Complaint  Patient presents with  . Eye Drainage  . Nasal Congestion  . Sore Throat    History of Present Illness:   Sonya Brady is a 62 year old female who has had a one-week history of upper respiratory symptoms including cough productive of green sputum, fever of up to 101.3, chills, myalgias, sore throat, nasal congestion, sinus pressure, headache, and ear pressure. Over the past days she's had some redness and discharge from the left eye. Her granddaughter had the same symptoms. She went to her primary care physician this past Wednesday, 4 days ago. She was given amoxicillin for tonsillitis. Her throat was not cultured. The patient states she feels no better today.  Review of Systems:  Other than noted above, the patient denies any of the following symptoms: Systemic:  No fevers, chills, sweats, weight loss or gain, fatigue, or tiredness. Eye:  No redness or discharge. ENT:  No ear pain, drainage, headache, nasal congestion, drainage, sinus pressure, difficulty swallowing, or sore throat. Neck:  No neck pain or swollen glands. Lungs:  No cough, sputum production, hemoptysis, wheezing, chest tightness, shortness of breath or chest pain. GI:  No abdominal pain, nausea, vomiting or diarrhea.  PMFSH:  Past medical history, family history, social history, meds, and allergies were reviewed. She has hypertension, hypercholesterolemia, and hypothyroidism. She takes DEXILANT, Pepcid, Flonase, lisinopril/Avapro thiazide, simvastatin, and Synthroid.  Physical Exam:   Vital signs:  BP 153/85  Pulse 100  Temp(Src) 98.3 F (36.8 C) (Oral)  Resp 18  SpO2 100% General:  Alert and oriented.  In no distress.  Skin warm and dry. Eye:  Conjunctiva of her left eye was slightly injected, is no discharge or crusting. Lids were normal. ENT:  She has a ventilation tube in her left tympanic membrane. There is yellowish exudate in the ear canal, and the tympanic membrane is opaque  and red.  Nasal mucosa was clear and uncongested, without drainage.  Mucous membranes were moist.  Tonsils were enlarged and red without any exudate, there was no bulging of the anterior tonsillar pillars, and the uvula was midline.  There were no oral ulcerations or lesions. Neck:  Supple, no adenopathy, tenderness or mass. Lungs:  No respiratory distress.  Lungs were clear to auscultation, without wheezes, rales or rhonchi.  Breath sounds were clear and equal bilaterally.  Heart:  Regular rhythm, without gallops, murmers or rubs. Skin:  Clear, warm, and dry, without rash or lesions.  Labs:   Results for orders placed during the hospital encounter of 09/13/13  POCT RAPID STREP A ([facility] URG CARE ONLY)      Result Value Range   Streptococcus, Group A Screen (Direct) NEGATIVE  NEGATIVE     Radiology:  Dg Chest 2 View  09/13/2013   CLINICAL DATA:  Cough, fever, and congestion.  EXAM: CHEST  2 VIEW  COMPARISON:  None.  FINDINGS: The lungs are well-expanded. There is no focal infiltrate. There is no pleural effusion or pneumothorax. The cardiac silhouette is normal in size. The pulmonary vascularity is not engorged. There is no acute bony abnormality.  IMPRESSION: There is no evidence of pneumonia nor other acute cardiopulmonary abnormality.   Electronically Signed   By: Joeseph Verville  [place]   On: 09/13/2013 15:04   Assessment:  The primary encounter diagnosis was Tonsillitis. Diagnoses of Cough and Conjunctivitis were also pertinent to this visit.  This could be all a viral syndrome such as adenovirus, she could have influenza, or this could  be a bacterial infection which is resistant to the amoxicillin. For right now will have her stop the amoxicillin and switch to Augmentin. Was also given a five-day course of prednisone, Polytrim eyedrops, and Tussionex for the cough. Suggested she followup with Dr. Debby Bud next week.  Plan:   1.  Meds:  The following meds were prescribed:   Discharge Medication List as of  09/13/2013  3:11 PM    START taking these medications   Details  amoxicillin-clavulanate (AUGMENTIN) 875-125 MG per tablet Take 1 tablet by mouth 2 (two) times daily., Starting 09/13/2013, Until Discontinued, Normal    chlorpheniramine-HYDROcodone (TUSSIONEX) 10-8 MG/5ML LQCR Take 5 mLs by mouth every 12 (twelve) hours as needed for cough., Starting 09/13/2013, Until Discontinued, Normal    predniSONE (DELTASONE) 20 MG tablet Take 1 tablet (20 mg total) by mouth 2 (two) times daily., Starting 09/13/2013, Until Discontinued, Normal    trimethoprim-polymyxin b (POLYTRIM) ophthalmic solution Place 1 drop into the left eye every 4 (four) hours., Starting 09/13/2013, Until Discontinued, Normal        2.  Patient Education/Counseling:  The patient was given appropriate handouts, self care instructions, and instructed in symptomatic relief.   3.  Follow up:  The patient was told to follow up if no better in 3 to 4 days, if becoming worse in any way, and given some red flag symptoms such as fever or difficulty breathing which would prompt immediate return.  Follow up with Dr. Debby Bud next week.      Reuben Likes, MD 09/13/13 [phone]

## 2013-09-13 NOTE — ED Notes (Signed)
Pt states she began with cough (esp. At night) 1 week ago- saw doctor and was given cough medicine.  States 4 days ago she developed a sorethroat with fever of 101 and was put on amoxicillin by her doctor- has been on this since Wednesday midday.  Sore throat has not improved with amoxicillin. Reports last night she developed nasal congestion, and drainage, itching and redness to lt eye.

## 2013-09-15 ENCOUNTER — Telehealth: Payer: Self-pay

## 2013-09-15 LAB — CULTURE, GROUP A STREP

## 2013-09-15 NOTE — Telephone Encounter (Signed)
Continue eye drops. Use warm compress with washcloth. For lack of improvement over the next 24-48 hours - ov.

## 2013-09-15 NOTE — Telephone Encounter (Signed)
Patient called Sonya Brady stating that she was seen by urgent care on 09/13/13 for "puss in her left eye" and prescribed an eye drop.. She c/o "puss in right eye" now and would like advisement.

## 2013-09-16 NOTE — Telephone Encounter (Signed)
Returned call to pt lmovm with MD advisment.

## 2013-10-16 ENCOUNTER — Encounter: Payer: PRIVATE HEALTH INSURANCE | Admitting: Internal Medicine

## 2013-12-05 ENCOUNTER — Other Ambulatory Visit: Payer: Self-pay | Admitting: Internal Medicine

## 2014-02-11 ENCOUNTER — Encounter: Payer: Self-pay | Admitting: Internal Medicine

## 2014-02-11 ENCOUNTER — Ambulatory Visit (INDEPENDENT_AMBULATORY_CARE_PROVIDER_SITE_OTHER): Payer: PRIVATE HEALTH INSURANCE | Admitting: Internal Medicine

## 2014-02-11 VITALS — BP 112/74 | HR 100 | Ht 66.0 in | Wt 184.6 lb

## 2014-02-11 DIAGNOSIS — K219 Gastro-esophageal reflux disease without esophagitis: Secondary | ICD-10-CM

## 2014-02-11 MED ORDER — OMEPRAZOLE 20 MG PO CPDR: 20.0000 mg | DELAYED_RELEASE_CAPSULE | Freq: Every day | ORAL | Status: DC

## 2014-02-11 NOTE — Progress Notes (Signed)
Seen with NP. Agree

## 2014-02-11 NOTE — Patient Instructions (Signed)
Per Nevin Bloodgood, please discontinue the Pepcid.  We have sent the following medications to your pharmacy for you to pick up at your convenience:  Omeprazole

## 2014-02-11 NOTE — Progress Notes (Signed)
     History of Present Illness:   Patient is a 63 year old female, previously followed by Dr. Sharlett Iles. Since Dr. Buel Ream retirement, patient has established care with Dr. Henrene Pastor. Patient is worked in today for evaluation of belching and heartburn. She is on chronic Pepcid, sleeps with the head of her bed elevated and tries to adhere to other anti-reflux measures. Despite efforts, patient has frequent heartburn. She was treated for H. Pylori infection in October 2014 and worries if this should be rechecked.  Patient has a history of diverticulitis. Three weeks ago she had recurrent lower abdominal pain and bloating. Patient had some amoxicillin at home which she took for 5 days with improvement in pain.  Current Medications, Allergies, Past Medical History, Past Surgical History, Family History and Social History were reviewed in [employer] record.  Physical Exam: General: Pleasant, well developed female in no acute distress Head: Normocephalic and atraumatic Eyes:  sclerae anicteric, conjunctiva pink  Ears: Normal auditory acuity Lungs: Clear throughout to auscultation Heart: Regular rate and rhythm Abdomen: Soft, non distended, non-tender. No masses, no hepatomegaly. Normal bowel sounds Musculoskeletal: Symmetrical with no gross deformities  Extremities: No edema  Neurological: Alert oriented x 4, grossly nonfocal Psychological:  Alert and cooperative. Normal mood and affect  Assessment and Recommendations:  23. 63 year old  Female with long-standing GERD, suboptimally controlled on daily Pepcid. Will discontinue Pepcid, recommended trial of PPI. Start Omeprazole 20 mg daily 30 minutes before breakfast. Continue antireflux measures (literature given). Patient to call in a couple weeks with a condition update.  2. history of H. pylori infection October 2014. Patient inquires about the need to retest for the organism. She completed the antibiotics, I don't see  a need to test for eradication, especially since she will be on a PPI  3. history of diverticulitis with what sounds like a recent recurrence. Patient had some amoxicillin at home, took 5 days worth and symptoms totally resolved. Her abdominal exam is benign.

## 2014-03-10 ENCOUNTER — Other Ambulatory Visit: Payer: Self-pay | Admitting: *Deleted

## 2014-03-10 MED ORDER — SIMVASTATIN 40 MG PO TABS: ORAL_TABLET | ORAL | Status: DC

## 2014-06-23 ENCOUNTER — Ambulatory Visit (INDEPENDENT_AMBULATORY_CARE_PROVIDER_SITE_OTHER): Payer: PRIVATE HEALTH INSURANCE | Admitting: Internal Medicine

## 2014-06-23 ENCOUNTER — Other Ambulatory Visit (INDEPENDENT_AMBULATORY_CARE_PROVIDER_SITE_OTHER): Payer: PRIVATE HEALTH INSURANCE

## 2014-06-23 ENCOUNTER — Encounter: Payer: Self-pay | Admitting: Internal Medicine

## 2014-06-23 ENCOUNTER — Ambulatory Visit (INDEPENDENT_AMBULATORY_CARE_PROVIDER_SITE_OTHER)
Admission: RE | Admit: 2014-06-23 | Discharge: 2014-06-23 | Disposition: A | Discharge status: Home / Self Care | Payer: PRIVATE HEALTH INSURANCE | Source: Ambulatory Visit | Attending: Internal Medicine | Admitting: Internal Medicine

## 2014-06-23 VITALS — BP 128/82 | HR 89 | Temp 98.3°F | Resp 16 | Ht 66.0 in | Wt 189.0 lb

## 2014-06-23 DIAGNOSIS — K5792 Diverticulitis of intestine, part unspecified, without perforation or abscess without bleeding: Secondary | ICD-10-CM

## 2014-06-23 DIAGNOSIS — K219 Gastro-esophageal reflux disease without esophagitis: Secondary | ICD-10-CM

## 2014-06-23 DIAGNOSIS — Z8781 Personal history of (healed) traumatic fracture: Secondary | ICD-10-CM

## 2014-06-23 DIAGNOSIS — I10 Essential (primary) hypertension: Secondary | ICD-10-CM | POA: Diagnosis not present

## 2014-06-23 DIAGNOSIS — K5732 Diverticulitis of large intestine without perforation or abscess without bleeding: Secondary | ICD-10-CM

## 2014-06-23 DIAGNOSIS — E039 Hypothyroidism, unspecified: Secondary | ICD-10-CM

## 2014-06-23 DIAGNOSIS — E785 Hyperlipidemia, unspecified: Secondary | ICD-10-CM | POA: Diagnosis not present

## 2014-06-23 DIAGNOSIS — Z0000 Encounter for general adult medical examination without abnormal findings: Secondary | ICD-10-CM

## 2014-06-23 LAB — BASIC METABOLIC PANEL
BUN: 8 mg/dL (ref 6–23)
CALCIUM: 9.8 mg/dL (ref 8.4–10.5)
CO2: 26 mEq/L (ref 19–32)
CREATININE: 0.7 mg/dL (ref 0.4–1.2)
Chloride: 101 mEq/L (ref 96–112)
GFR: 83.79 mL/min (ref >60.00)
GLUCOSE: 131 mg/dL — AB (ref 70–99)
Potassium: 4.1 mEq/L (ref 3.5–5.1)
Sodium: 138 mEq/L (ref 135–145)

## 2014-06-23 LAB — TSH: TSH: 0.96 u[IU]/mL (ref 0.35–4.50)

## 2014-06-23 MED ORDER — LOSARTAN POTASSIUM-HCTZ 50-12.5 MG PO TABS: 1.0000 | ORAL_TABLET | Freq: Every day | ORAL | Status: DC

## 2014-06-23 NOTE — Progress Notes (Signed)
   Subjective:    Patient ID: Sonya Brady, female    DOB: 1951/01/23, 63 y.o.   MRN: [id number]  HPI The patient is a 63 YO female from [place] coming in today to establish care. She has PMH of HTN, recent fracture of leg after fall (Jan this year and doing well now), hypothyroidism, hyperlipidemia. She is also having a dry cough which is bothersome to her. She has tried omeprazole but is still having this issue. She denies chest pains, SOB, constipation, diarrhea. She has history of diverticulitis and tries to avoid seeds and maintain good fiber in her diet. She fell from chair height when she broke her leg and twisted it (spiral fracture). She has never had bone density test.   Review of Systems  Constitutional: Negative for fever, chills, activity change, appetite change and fatigue.  HENT: Negative.   Eyes: Negative.   Respiratory: Positive for cough. Negative for chest tightness, shortness of breath and wheezing.   Cardiovascular: Negative for chest pain, palpitations and leg swelling.  Gastrointestinal: Negative for abdominal pain, diarrhea, constipation and abdominal distention.  Endocrine: Negative.   Musculoskeletal: Negative for arthralgias, back pain, gait problem and myalgias.  Skin: Positive for color change.       Some darkening of the skin over where previous fracture.   Neurological: Negative for dizziness, weakness, light-headedness, numbness and headaches.  Hematological: Negative for adenopathy. Does not bruise/bleed easily.      Objective:   Physical Exam  Constitutional: She is oriented to person, place, and time. She appears well-developed and well-nourished. No distress.  HENT:  Head: Normocephalic and atraumatic.  Eyes: EOM are normal.  Neck: Normal range of motion.  Cardiovascular: Normal rate and regular rhythm.   Pulmonary/Chest: Effort normal and breath sounds normal. No respiratory distress. She has no wheezes. She has no rales.  Abdominal: Soft. Bowel  sounds are normal. She exhibits no distension. There is no tenderness. There is no rebound.  Neurological: She is alert and oriented to person, place, and time. Coordination normal.  Skin: Skin is warm and dry.   Filed Vitals:   06/23/14 1010  BP: 128/82  Pulse: 89  Temp: 98.3 F (36.8 C)  TempSrc: Oral  Resp: 16  Height: 5\' 6"  (1.676 m)  Weight: 189 lb (85.73 kg)  SpO2: 97%      Assessment & Plan:

## 2014-06-23 NOTE — Assessment & Plan Note (Signed)
Checking lipid panel today. On simvastatin.

## 2014-06-23 NOTE — Assessment & Plan Note (Signed)
Checking TSH today and will adjust as needed.

## 2014-06-23 NOTE — Assessment & Plan Note (Signed)
Advised patient that the guidance about avoiding seeds in foods has been relaxed and that maintaining fiber in her diet is more important and she can eat seeds in food if she likes them (she really likes blueberries).

## 2014-06-23 NOTE — Assessment & Plan Note (Signed)
Patient is currently taking omeprazole 20 mg as needed. Unclear if she may be able to stop once we switch her off the ACE-I.

## 2014-06-23 NOTE — Addendum Note (Signed)
Addended by: Vertell Novak A on: 06/23/2014 11:42 AM   Modules accepted: Orders

## 2014-06-23 NOTE — Progress Notes (Signed)
Pre visit review using our clinic review tool, if applicable. No additional management support is needed unless otherwise documented below in the visit note. 

## 2014-06-23 NOTE — Assessment & Plan Note (Signed)
Will switch lisinopril to losartan for likely ACE-I related cough. Advised her that after switching it may take 1 month or so to fully resolve. BP well controlled today and will follow.  Filed Vitals:   06/23/14 1010  BP: 128/82  Pulse: 89  Temp: 98.3 F (36.8 C)  TempSrc: Oral  Resp: 16  Height: 5\' 6"  (1.676 m)  Weight: 189 lb (85.73 kg)  SpO2: 97%

## 2014-06-23 NOTE — Assessment & Plan Note (Signed)
She has never had DEXA scan and with recent fracture from chair height will check bone density. Encouraged weight bearing activity.

## 2014-06-23 NOTE — Patient Instructions (Signed)
We will check on your blood work today and call you about the results.   We will have you do a bone density scan to check on your bones.   We have changed your blood pressure medicine and you should notice that the cough will disappear over the next month or so.   Exercise to Stay Healthy Exercise helps you become and stay healthy. EXERCISE IDEAS AND TIPS Choose exercises that:  You enjoy.  Fit into your day. You do not need to exercise really hard to be healthy. You can do exercises at a slow or medium level and stay healthy. You can:  Stretch before and after working out.  Try yoga, Pilates, or tai chi.  Lift weights.  Walk fast, swim, jog, run, climb stairs, bicycle, dance, or rollerskate.  Take aerobic classes. Exercises that burn about 150 calories:  Running 1  miles in 15 minutes.  Playing volleyball for 45 to 60 minutes.  Washing and waxing a car for 45 to 60 minutes.  Playing touch football for 45 minutes.  Walking 1  miles in 35 minutes.  Pushing a stroller 1  miles in 30 minutes.  Playing basketball for 30 minutes.  Raking leaves for 30 minutes.  Bicycling 5 miles in 30 minutes.  Walking 2 miles in 30 minutes.  Dancing for 30 minutes.  Shoveling snow for 15 minutes.  Swimming laps for 20 minutes.  Walking up stairs for 15 minutes.  Bicycling 4 miles in 15 minutes.  Gardening for 30 to 45 minutes.  Jumping rope for 15 minutes.  Washing windows or floors for 45 to 60 minutes. Document Released: 10/28/2010 Document Revised: 12/18/2011 Document Reviewed: 10/28/2010 [facility] Patient Information 2015 [place], [place]. This information is not intended to replace advice given to you by your health care provider. Make sure you discuss any questions you have with your health care provider.

## 2014-06-24 ENCOUNTER — Other Ambulatory Visit: Payer: Self-pay | Admitting: Internal Medicine

## 2014-06-24 LAB — CBC
HCT: 43.6 % (ref 36.0–46.0)
Hemoglobin: 14.7 g/dL (ref 12.0–15.0)
MCHC: 33.7 g/dL (ref 30.0–36.0)
MCV: 87.5 fl (ref 78.0–100.0)
Platelets: 246 10*3/uL (ref 150.0–400.0)
RBC: 4.98 Mil/uL (ref 3.87–5.11)
RDW: 13.3 % (ref 11.5–15.5)
WBC: 4.4 10*3/uL (ref 4.0–10.5)

## 2014-06-25 ENCOUNTER — Telehealth: Payer: Self-pay | Admitting: *Deleted

## 2014-06-25 MED ORDER — SIMVASTATIN 40 MG PO TABS: ORAL_TABLET | ORAL | Status: DC

## 2014-06-25 NOTE — Telephone Encounter (Signed)
Left msg on triage requesting rx on her simvastatin to [facility] Came in for new appt with Dr. Doug Sou on 06/22/14. Called pt no answer LMOM sent [facility]...Johny Chess

## 2014-06-26 ENCOUNTER — Telehealth: Payer: Self-pay | Admitting: Internal Medicine

## 2014-06-26 NOTE — Telephone Encounter (Signed)
Pt was returning Call.  Please contact her back on her cell.    Thank you

## 2014-08-27 ENCOUNTER — Telehealth: Payer: Self-pay | Admitting: *Deleted

## 2014-08-27 MED ORDER — LISINOPRIL-HYDROCHLOROTHIAZIDE 20-12.5 MG PO TABS: 1.0000 | ORAL_TABLET | Freq: Every day | ORAL | Status: DC

## 2014-08-27 NOTE — Telephone Encounter (Signed)
Left message for patient to call me back. 

## 2014-08-27 NOTE — Telephone Encounter (Signed)
Left msg on triage stating new BP med md rx she is having problem with. Causing dry mouth and dry skin. Pt is wanting to go back on her lisinopril 20 mg../lmb

## 2014-08-27 NOTE — Telephone Encounter (Signed)
Okay, stop current blood pressure medication. Have called back in the medication she used to be on.

## 2014-08-28 NOTE — Telephone Encounter (Signed)
Notified pt with md response. [facility] sent [facility]...Sonya Brady

## 2014-08-28 NOTE — Telephone Encounter (Signed)
Patient aware and will go get her new prescription.

## 2014-09-08 ENCOUNTER — Ambulatory Visit (INDEPENDENT_AMBULATORY_CARE_PROVIDER_SITE_OTHER): Payer: PRIVATE HEALTH INSURANCE | Admitting: Internal Medicine

## 2014-09-08 ENCOUNTER — Encounter: Payer: Self-pay | Admitting: Internal Medicine

## 2014-09-08 VITALS — BP 120/68 | HR 90 | Temp 98.4°F | Wt 189.1 lb

## 2014-09-08 DIAGNOSIS — J209 Acute bronchitis, unspecified: Secondary | ICD-10-CM | POA: Diagnosis not present

## 2014-09-08 MED ORDER — AZITHROMYCIN 250 MG PO TABS: ORAL_TABLET | ORAL | Status: DC

## 2014-09-08 MED ORDER — HYDROCODONE-HOMATROPINE 5-1.5 MG/5ML PO SYRP: 5.0000 mL | ORAL_SOLUTION | Freq: Four times a day (QID) | ORAL | Status: DC | PRN

## 2014-09-08 NOTE — Progress Notes (Signed)
   Subjective:    Patient ID: Sonya Brady, female    DOB: [birth year], 63 y.o.   MRN: [id number]  HPI   Symptoms began 09/04/14 as pressure in the left ear. She has an otic tube and she felt as if it were coming out.  She subsequently developed yellow-green sputum with paroxysmal cough, worse in the morning. NyQuil and DayQuil of only minimal benefit  She had been exposed to sick grandchild  She's had some itchy, watery eyes.  She's never smoked. She is on ACE inhibitor. She denies any upper respiratory tract  symptoms.    Review of Systems Frontal headache, facial pain , nasal purulence, dental pain, sore throat , otic pain or otic discharge denied. No fever , chills or sweats.     Objective:   Physical Exam General appearance:good health ;well nourished; no acute distress or increased work of breathing is present.  No  lymphadenopathy about the head, neck, or axilla noted.   Eyes: No conjunctival inflammation or lid edema is present. There is no scleral icterus.  Ears:  External ear exam shows no significant lesions or deformities.  Otoscopic examination reveals clear canals, tympanic membranes are intact bilaterally without bulging, retraction, inflammation or discharge.L otic tube almost out  Nose:  External nasal examination shows no deformity or inflammation. Nasal mucosa are slightly boggy  without lesions or exudates. No septal dislocation or deviation.No obstruction to airflow.   Oral exam: Dental hygiene is good; lips and gums are healthy appearing.There is no oropharyngeal erythema or exudate noted.   Neck:  No deformities, thyromegaly, masses, or tenderness noted.     Heart:  Normal rate and regular rhythm. S1 and S2 normal without gallop, murmur, click, rub or other extra sounds.   Lungs:Chest clear to auscultation; no wheezes, rhonchi,rales ,or rubs present.No increased work of breathing.    Extremities:  No cyanosis, edema, or clubbing  noted    Skin: Warm &  dry w/o jaundice or tenting.         Assessment & Plan:  #1 acute bronchitis w/o bronchospasm #2 no URI Plan: See orders and recommendations

## 2014-09-08 NOTE — Patient Instructions (Signed)
Carry room temperature water and sip liberally after coughing. 

## 2014-09-08 NOTE — Progress Notes (Signed)
Pre visit review using our clinic review tool, if applicable. No additional management support is needed unless otherwise documented below in the visit note. 

## 2014-09-23 ENCOUNTER — Other Ambulatory Visit: Payer: Self-pay | Admitting: Geriatric Medicine

## 2014-09-23 ENCOUNTER — Telehealth: Payer: Self-pay | Admitting: Internal Medicine

## 2014-09-23 MED ORDER — AMOXICILLIN 500 MG PO CAPS: 500.0000 mg | ORAL_CAPSULE | Freq: Three times a day (TID) | ORAL | Status: DC

## 2014-09-23 NOTE — Telephone Encounter (Signed)
Pt called stated that she was here to see Dr. Linna Darner on 12.1.15 for a cough. Dr. Linna Darner gave her medication but it is not working very well. Pt was wondering if Dr. Linna Darner can give her something?

## 2014-09-23 NOTE — Telephone Encounter (Signed)
Sent in to pharmacy and I also informed patient.

## 2014-09-23 NOTE — Telephone Encounter (Signed)
Amox 500 mg 1 tid #21 Must be seen if no better

## 2014-12-31 ENCOUNTER — Other Ambulatory Visit: Payer: Self-pay | Admitting: Geriatric Medicine

## 2014-12-31 MED ORDER — SYNTHROID 112 MCG PO TABS: 112.0000 ug | ORAL_TABLET | Freq: Every day | ORAL | Status: DC

## 2015-01-06 ENCOUNTER — Other Ambulatory Visit: Payer: Self-pay | Admitting: Geriatric Medicine

## 2015-01-06 MED ORDER — SYNTHROID 112 MCG PO TABS: 112.0000 ug | ORAL_TABLET | Freq: Every day | ORAL | Status: DC

## 2015-02-19 ENCOUNTER — Other Ambulatory Visit: Payer: Self-pay | Admitting: Obstetrics and Gynecology

## 2015-02-19 DIAGNOSIS — R928 Other abnormal and inconclusive findings on diagnostic imaging of breast: Secondary | ICD-10-CM

## 2015-02-24 ENCOUNTER — Ambulatory Visit
Admission: RE | Admit: 2015-02-24 | Discharge: 2015-02-24 | Disposition: A | Discharge status: Home / Self Care | Payer: PRIVATE HEALTH INSURANCE | Source: Ambulatory Visit | Attending: Obstetrics and Gynecology | Admitting: Obstetrics and Gynecology

## 2015-02-24 ENCOUNTER — Other Ambulatory Visit: Payer: Self-pay | Admitting: Obstetrics and Gynecology

## 2015-02-24 DIAGNOSIS — R928 Other abnormal and inconclusive findings on diagnostic imaging of breast: Secondary | ICD-10-CM

## 2015-02-24 LAB — HM MAMMOGRAPHY

## 2015-05-14 ENCOUNTER — Encounter: Payer: Self-pay | Admitting: *Deleted

## 2015-05-19 ENCOUNTER — Telehealth: Payer: Self-pay | Admitting: Internal Medicine

## 2015-05-19 ENCOUNTER — Telehealth: Payer: Self-pay

## 2015-05-19 NOTE — Telephone Encounter (Signed)
LVM for pt to call back in regards to scheduling AWV with our health coach.   RE: No CPE for 2016. CPE or AWV needed.

## 2015-05-19 NOTE — Telephone Encounter (Signed)
Got patient scheduled for wellness on 9/16 at 8:00 am

## 2015-06-25 ENCOUNTER — Encounter: Payer: PRIVATE HEALTH INSURANCE | Admitting: Internal Medicine

## 2015-07-09 ENCOUNTER — Ambulatory Visit (INDEPENDENT_AMBULATORY_CARE_PROVIDER_SITE_OTHER): Payer: PRIVATE HEALTH INSURANCE | Admitting: Internal Medicine

## 2015-07-09 ENCOUNTER — Encounter: Payer: Self-pay | Admitting: Internal Medicine

## 2015-07-09 ENCOUNTER — Other Ambulatory Visit (INDEPENDENT_AMBULATORY_CARE_PROVIDER_SITE_OTHER): Payer: PRIVATE HEALTH INSURANCE

## 2015-07-09 VITALS — BP 122/78 | HR 98 | Temp 98.5°F | Resp 12 | Ht 66.0 in | Wt 184.8 lb

## 2015-07-09 DIAGNOSIS — Z23 Encounter for immunization: Secondary | ICD-10-CM | POA: Diagnosis not present

## 2015-07-09 DIAGNOSIS — E039 Hypothyroidism, unspecified: Secondary | ICD-10-CM

## 2015-07-09 DIAGNOSIS — I10 Essential (primary) hypertension: Secondary | ICD-10-CM

## 2015-07-09 DIAGNOSIS — K219 Gastro-esophageal reflux disease without esophagitis: Secondary | ICD-10-CM | POA: Diagnosis not present

## 2015-07-09 DIAGNOSIS — E785 Hyperlipidemia, unspecified: Secondary | ICD-10-CM

## 2015-07-09 DIAGNOSIS — Z0000 Encounter for general adult medical examination without abnormal findings: Secondary | ICD-10-CM

## 2015-07-09 LAB — LIPID PANEL
CHOL/HDL RATIO: 4
Cholesterol: 200 mg/dL (ref 0–200)
HDL: 48.9 mg/dL (ref >39.00)
LDL Cholesterol: 128 mg/dL — ABNORMAL HIGH (ref 0–99)
NonHDL: 150.94
TRIGLYCERIDES: 114 mg/dL (ref 0.0–149.0)
VLDL: 22.8 mg/dL (ref 0.0–40.0)

## 2015-07-09 LAB — COMPREHENSIVE METABOLIC PANEL
ALT: 23 U/L (ref 0–35)
AST: 20 U/L (ref 0–37)
Albumin: 4.8 g/dL (ref 3.5–5.2)
Alkaline Phosphatase: 77 U/L (ref 39–117)
BUN: 11 mg/dL (ref 6–23)
CHLORIDE: 100 meq/L (ref 96–112)
CO2: 28 mEq/L (ref 19–32)
Calcium: 10.2 mg/dL (ref 8.4–10.5)
Creatinine, Ser: 0.68 mg/dL (ref 0.40–1.20)
GFR: 92.08 mL/min (ref >60.00)
GLUCOSE: 147 mg/dL — AB (ref 70–99)
Potassium: 4.2 mEq/L (ref 3.5–5.1)
SODIUM: 138 meq/L (ref 135–145)
Total Bilirubin: 0.6 mg/dL (ref 0.2–1.2)
Total Protein: 8 g/dL (ref 6.0–8.3)

## 2015-07-09 LAB — TSH: TSH: 3.64 u[IU]/mL (ref 0.35–4.50)

## 2015-07-09 NOTE — Patient Instructions (Signed)
We will check the blood work today and call you back about the results.   We have given you the pneumonia shot today.   Keep up the good work with your health!  Come back next year or sooner if you have problems or questions.   Health Maintenance Adopting a healthy lifestyle and getting preventive care can go a long way to promote health and wellness. Talk with your health care Yosiah Jasmin about what schedule of regular examinations is right for you. This is a good chance for you to check in with your Saketh Daubert about disease prevention and staying healthy. In between checkups, there are plenty of things you can do on your own. Experts have done a lot of research about which lifestyle changes and preventive measures are most likely to keep you healthy. Ask your health care Elliana Bal for more information. WEIGHT AND DIET  Eat a healthy diet  Be sure to include plenty of vegetables, fruits, low-fat dairy products, and lean protein.  Do not eat a lot of foods high in solid fats, added sugars, or salt.  Get regular exercise. This is one of the most important things you can do for your health.  Most adults should exercise for at least 150 minutes each week. The exercise should increase your heart rate and make you sweat (moderate-intensity exercise).  Most adults should also do strengthening exercises at least twice a week. This is in addition to the moderate-intensity exercise.  Maintain a healthy weight  Body mass index (BMI) is a measurement that can be used to identify possible weight problems. It estimates body fat based on height and weight. Your health care Love Chowning can help determine your BMI and help you achieve or maintain a healthy weight.  For females 58 years of age and older:   A BMI below 18.5 is considered underweight.  A BMI of 18.5 to 24.9 is normal.  A BMI of 25 to 29.9 is considered overweight.  A BMI of 30 and above is considered obese.  Watch levels of cholesterol  and blood lipids  You should start having your blood tested for lipids and cholesterol at 65 years of age, then have this test every 5 years.  You may need to have your cholesterol levels checked more often if:  Your lipid or cholesterol levels are high.  You are older than 65 years of age.  You are at high risk for heart disease.  CANCER SCREENING   Lung Cancer  Lung cancer screening is recommended for adults 53-62 years old who are at high risk for lung cancer because of a history of smoking.  A yearly low-dose CT scan of the lungs is recommended for people who:  Currently smoke.  Have quit within the past 15 years.  Have at least a 30-pack-year history of smoking. A pack year is smoking an average of one pack of cigarettes a day for 1 year.  Yearly screening should continue until it has been 15 years since you quit.  Yearly screening should stop if you develop a health problem that would prevent you from having lung cancer treatment.  Breast Cancer  Practice breast self-awareness. This means understanding how your breasts normally appear and feel.  It also means doing regular breast self-exams. Let your health care Lakenya Riendeau know about any changes, no matter how small.  If you are in your 20s or 30s, you should have a clinical breast exam (CBE) by a health care Kenesha Moshier every 1-3 years as part  of a regular health exam.  If you are 43 or older, have a CBE every year. Also consider having a breast X-ray (mammogram) every year.  If you have a family history of breast cancer, talk to your health care Safiya Girdler about genetic screening.  If you are at high risk for breast cancer, talk to your health care Antwonette Feliz about having an MRI and a mammogram every year.  Breast cancer gene (BRCA) assessment is recommended for women who have family members with BRCA-related cancers. BRCA-related cancers include:  Breast.  Ovarian.  Tubal.  Peritoneal cancers.  Results of the  assessment will determine the need for genetic counseling and BRCA1 and BRCA2 testing. Cervical Cancer Routine pelvic examinations to screen for cervical cancer are no longer recommended for nonpregnant women who are considered low risk for cancer of the pelvic organs (ovaries, uterus, and vagina) and who do not have symptoms. A pelvic examination may be necessary if you have symptoms including those associated with pelvic infections. Ask your health care Yue Glasheen if a screening pelvic exam is right for you.   The Pap test is the screening test for cervical cancer for women who are considered at risk.  If you had a hysterectomy for a problem that was not cancer or a condition that could lead to cancer, then you no longer need Pap tests.  If you are older than 65 years, and you have had normal Pap tests for the past 10 years, you no longer need to have Pap tests.  If you have had past treatment for cervical cancer or a condition that could lead to cancer, you need Pap tests and screening for cancer for at least 20 years after your treatment.  If you no longer get a Pap test, assess your risk factors if they change (such as having a new sexual partner). This can affect whether you should start being screened again.  Some women have medical problems that increase their chance of getting cervical cancer. If this is the case for you, your health care Nylen Creque may recommend more frequent screening and Pap tests.  The human papillomavirus (HPV) test is another test that may be used for cervical cancer screening. The HPV test looks for the virus that can cause cell changes in the cervix. The cells collected during the Pap test can be tested for HPV.  The HPV test can be used to screen women 105 years of age and older. Getting tested for HPV can extend the interval between normal Pap tests from three to five years.  An HPV test also should be used to screen women of any age who have unclear Pap test  results.  After 65 years of age, women should have HPV testing as often as Pap tests.  Colorectal Cancer  This type of cancer can be detected and often prevented.  Routine colorectal cancer screening usually begins at 65 years of age and continues through 65 years of age.  Your health care Jackelynn Hosie may recommend screening at an earlier age if you have risk factors for colon cancer.  Your health care Yordin Rhoda may also recommend using home test kits to check for hidden blood in the stool.  A small camera at the end of a tube can be used to examine your colon directly (sigmoidoscopy or colonoscopy). This is done to check for the earliest forms of colorectal cancer.  Routine screening usually begins at age 1.  Direct examination of the colon should be repeated every 5-10 years  through 65 years of age. However, you may need to be screened more often if early forms of precancerous polyps or small growths are found. Skin Cancer  Check your skin from head to toe regularly.  Tell your health care Srinidhi Landers about any new moles or changes in moles, especially if there is a change in a mole's shape or color.  Also tell your health care Sarkis Rhines if you have a mole that is larger than the size of a pencil eraser.  Always use sunscreen. Apply sunscreen liberally and repeatedly throughout the day.  Protect yourself by wearing long sleeves, pants, a wide-brimmed hat, and sunglasses whenever you are outside. HEART DISEASE, DIABETES, AND HIGH BLOOD PRESSURE   Have your blood pressure checked at least every 1-2 years. High blood pressure causes heart disease and increases the risk of stroke.  If you are between 84 years and 52 years old, ask your health care Merissa Renwick if you should take aspirin to prevent strokes.  Have regular diabetes screenings. This involves taking a blood sample to check your fasting blood sugar level.  If you are at a normal weight and have a low risk for diabetes, have this  test once every three years after 65 years of age.  If you are overweight and have a high risk for diabetes, consider being tested at a younger age or more often. PREVENTING INFECTION  Hepatitis B  If you have a higher risk for hepatitis B, you should be screened for this virus. You are considered at high risk for hepatitis B if:  You were born in a country where hepatitis B is common. Ask your health care Sibel Khurana which countries are considered high risk.  Your parents were born in a high-risk country, and you have not been immunized against hepatitis B (hepatitis B vaccine).  You have HIV or AIDS.  You use needles to inject street drugs.  You live with someone who has hepatitis B.  You have had sex with someone who has hepatitis B.  You get hemodialysis treatment.  You take certain medicines for conditions, including cancer, organ transplantation, and autoimmune conditions. Hepatitis C  Blood testing is recommended for:  Everyone born from [birth year] through 1966.  Anyone with known risk factors for hepatitis C. Sexually transmitted infections (STIs)  You should be screened for sexually transmitted infections (STIs) including gonorrhea and chlamydia if:  You are sexually active and are younger than 65 years of age.  You are older than 65 years of age and your health care Kaspian Muccio tells you that you are at risk for this type of infection.  Your sexual activity has changed since you were last screened and you are at an increased risk for chlamydia or gonorrhea. Ask your health care Ricci Dirocco if you are at risk.  If you do not have HIV, but are at risk, it may be recommended that you take a prescription medicine daily to prevent HIV infection. This is called pre-exposure prophylaxis (PrEP). You are considered at risk if:  You are sexually active and do not regularly use condoms or know the HIV status of your partner(s).  You take drugs by injection.  You are sexually active with  a partner who has HIV. Talk with your health care Ajahnae Rathgeber about whether you are at high risk of being infected with HIV. If you choose to begin PrEP, you should first be tested for HIV. You should then be tested every 3 months for as long as you are taking  PrEP.  PREGNANCY   If you are premenopausal and you may become pregnant, ask your health care Hawkin Charo about preconception counseling.  If you may become pregnant, take 400 to 800 micrograms (mcg) of folic acid every day.  If you want to prevent pregnancy, talk to your health care Jerold Yoss about birth control (contraception). OSTEOPOROSIS AND MENOPAUSE   Osteoporosis is a disease in which the bones lose minerals and strength with aging. This can result in serious bone fractures. Your risk for osteoporosis can be identified using a bone density scan.  If you are 57 years of age or older, or if you are at risk for osteoporosis and fractures, ask your health care Tekila Caillouet if you should be screened.  Ask your health care Tavish Gettis whether you should take a calcium or vitamin D supplement to lower your risk for osteoporosis.  Menopause may have certain physical symptoms and risks.  Hormone replacement therapy may reduce some of these symptoms and risks. Talk to your health care Kastin Cerda about whether hormone replacement therapy is right for you.  HOME CARE INSTRUCTIONS   Schedule regular health, dental, and eye exams.  Stay current with your immunizations.   Do not use any tobacco products including cigarettes, chewing tobacco, or electronic cigarettes.  If you are pregnant, do not drink alcohol.  If you are breastfeeding, limit how much and how often you drink alcohol.  Limit alcohol intake to no more than 1 drink per day for nonpregnant women. One drink equals 12 ounces of beer, 5 ounces of wine, or 1 ounces of hard liquor.  Do not use street drugs.  Do not share needles.  Ask your health care Raianna Slight for help if you need  support or information about quitting drugs.  Tell your health care Taylin Mans if you often feel depressed.  Tell your health care Florena Kozma if you have ever been abused or do not feel safe at home. Document Released: 04/10/2011 Document Revised: 02/09/2014 Document Reviewed: 08/27/2013 [facility] Patient Information 2015 [place], [place]. This information is not intended to replace advice given to you by your health care Kassiah Mccrory. Make sure you discuss any questions you have with your health care Solita Macadam.

## 2015-07-09 NOTE — Assessment & Plan Note (Signed)
BP controlled on lisinopril/hctz and checking CMP today and adjust as needed.

## 2015-07-09 NOTE — Progress Notes (Signed)
   Subjective:    Patient ID: Sonya Brady, female    DOB: [birth year], 64 y.o.   MRN: [id number]  HPI The patient is a 64 YO female coming in for wellness. No new concerns. No side effects from medications. Please see A/P for assessment and treatment of chronic medical problems.   PMH, [facility], social history reviewed and updated.   Review of Systems  Constitutional: Negative for fever, chills, activity change, appetite change and fatigue.  HENT: Negative.   Eyes: Negative.   Respiratory: Negative for cough, chest tightness, shortness of breath and wheezing.   Cardiovascular: Negative for chest pain, palpitations and leg swelling.  Gastrointestinal: Negative for abdominal pain, diarrhea, constipation and abdominal distention.  Endocrine: Negative.   Musculoskeletal: Negative for myalgias, back pain, arthralgias and gait problem.  Skin: Positive for color change.       Some darkening of the skin over where previous fracture.   Neurological: Negative for dizziness, weakness, light-headedness, numbness and headaches.  Hematological: Negative for adenopathy. Does not bruise/bleed easily.  Psychiatric/Behavioral: Negative.       Objective:   Physical Exam  Constitutional: She is oriented to person, place, and time. She appears well-developed and well-nourished. No distress.  HENT:  Head: Normocephalic and atraumatic.  Eyes: EOM are normal.  Neck: Normal range of motion.  Cardiovascular: Normal rate and regular rhythm.   Pulmonary/Chest: Effort normal and breath sounds normal. No respiratory distress. She has no wheezes. She has no rales.  Abdominal: Soft. Bowel sounds are normal. She exhibits no distension. There is no tenderness. There is no rebound.  Musculoskeletal: She exhibits no edema.  Neurological: She is alert and oriented to person, place, and time. Coordination normal.  Skin: Skin is warm and dry.  Psychiatric: She has a normal mood and affect.   Filed Vitals:   07/09/15  0931  BP: 122/78  Pulse: 98  Temp: 98.5 F (36.9 C)  TempSrc: Oral  Resp: 12  Height: 5\' 6"  (1.676 m)  Weight: 184 lb 12.8 oz (83.825 kg)  SpO2: 95%      Assessment & Plan:  Prevnar given at visit.

## 2015-07-09 NOTE — Assessment & Plan Note (Signed)
Prevnar given today. Colonoscopy up to date until 2017. Gotten shingles and tetanus up to date. Flu done this year already. Is in good health and non-smoker. Exercises sometimes. Talked to her about more regular exercise. Getting screened for hepatitis C today, denies need for HIV screening.

## 2015-07-09 NOTE — Assessment & Plan Note (Signed)
Stable on synthroid 112 mcg dosing. Checking TSH and adjust as needed. No symptoms of over or under replacement.

## 2015-07-09 NOTE — Assessment & Plan Note (Signed)
Advised she can use ranitidine for symptoms as she is concerned about omeprazole and only using prn.

## 2015-07-09 NOTE — Progress Notes (Signed)
Pre visit review using our clinic review tool, if applicable. No additional management support is needed unless otherwise documented below in the visit note. 

## 2015-07-09 NOTE — Assessment & Plan Note (Signed)
Taking zocor 40 mg daily without side effects. Checking lipid panel today. Adjust as needed.

## 2015-07-09 NOTE — Addendum Note (Signed)
Addended by: Resa Miner R on: 07/09/2015 10:22 AM   Modules accepted: Orders

## 2015-07-10 LAB — HEPATITIS C ANTIBODY: HCV Ab: NEGATIVE

## 2015-07-12 DIAGNOSIS — H7312 Chronic myringitis, left ear: Secondary | ICD-10-CM | POA: Insufficient documentation

## 2015-08-06 ENCOUNTER — Ambulatory Visit (INDEPENDENT_AMBULATORY_CARE_PROVIDER_SITE_OTHER): Payer: PRIVATE HEALTH INSURANCE | Admitting: Family

## 2015-08-06 ENCOUNTER — Telehealth: Payer: Self-pay | Admitting: *Deleted

## 2015-08-06 ENCOUNTER — Encounter: Payer: Self-pay | Admitting: Family

## 2015-08-06 VITALS — BP 150/88 | HR 106 | Temp 98.4°F | Resp 16 | Ht 66.0 in | Wt 186.0 lb

## 2015-08-06 DIAGNOSIS — J019 Acute sinusitis, unspecified: Secondary | ICD-10-CM | POA: Insufficient documentation

## 2015-08-06 DIAGNOSIS — J0100 Acute maxillary sinusitis, unspecified: Secondary | ICD-10-CM

## 2015-08-06 MED ORDER — PROMETHAZINE-CODEINE 6.25-10 MG/5ML PO SYRP: 5.0000 mL | ORAL_SOLUTION | Freq: Four times a day (QID) | ORAL | Status: DC | PRN

## 2015-08-06 MED ORDER — AMOXICILLIN-POT CLAVULANATE 875-125 MG PO TABS: 1.0000 | ORAL_TABLET | Freq: Two times a day (BID) | ORAL | Status: DC

## 2015-08-06 MED ORDER — SIMVASTATIN 40 MG PO TABS: ORAL_TABLET | ORAL | Status: DC

## 2015-08-06 NOTE — Progress Notes (Signed)
Pre visit review using our clinic review tool, if applicable. No additional management support is needed unless otherwise documented below in the visit note. 

## 2015-08-06 NOTE — Telephone Encounter (Signed)
Left msg on triage pt needing g refill on her simvastatin.rx sent electronically...Johny Chess

## 2015-08-06 NOTE — Assessment & Plan Note (Signed)
Symptoms and exam consistent with sinusitis most likely bacterial given new onset worsening. Start Augmentin. Start promethazine with codeine as needed for cough and sleep. Continue over-the-counter medications as needed for symptom relief and supportive care. Follow-up if symptoms worsen or fail to improve.

## 2015-08-06 NOTE — Progress Notes (Signed)
Subjective:    Patient ID: Sonya Brady, female    DOB: Feb 04, 1951, 64 y.o.   MRN: [id number]  Chief Complaint  Patient presents with  . Cough    cough, stuffy nose, congestion, and drainage, states that the phlem she has just sits in her chest and does not want to come out, right side of her face hurts, x4 days    HPI:  Sonya Brady is a 64 y.o. female who  has a past medical history of Depression; Hypertension; Hypothyroidism; Acne; Breast mass, right; GERD (gastroesophageal reflux disease); Hypercholesteremia; Diverticulosis; Cholelithiasis; and H. pylori infection. and presents today for an acute office visit.   This is a new problem. Associated symptoms of cough, congestion, sinus pressure, and drainage coming going on for approximately 6 days. Modifying factors include dayquil and nyquil which have not helped very much. Severity of the cough is enough to disturb her sleep. Reports that she has progressively worsened over the course of the  5 days. Denies any recent antibiotic use.    No Known Allergies   Current Outpatient Prescriptions on File Prior to Visit  Medication Sig Dispense Refill  . Calcium Carbonate-Vitamin D (CALCIUM + D PO) Take by mouth.    . fluticasone (FLONASE) 50 MCG/ACT nasal spray Place 1 spray into the nose 2 (two) times daily. 16 g 6  . latanoprost (XALATAN) 0.005 % ophthalmic solution Place 1 drop into both eyes at bedtime.    Marland Kitchen lisinopril-hydrochlorothiazide (ZESTORETIC) 20-12.5 MG per tablet Take 1 tablet by mouth daily. 90 tablet 3  . montelukast (SINGULAIR) 10 MG tablet Take 10 mg by mouth at bedtime.    . Multiple Vitamins-Minerals (MULTIVITAMIN PO) Take by mouth.    Marland Kitchen omeprazole (PRILOSEC) 20 MG capsule Take 1 capsule (20 mg total) by mouth daily. 30 capsule 6  . simvastatin (ZOCOR) 40 MG tablet TAKE 1 TABLET BY MOUTH ONCE DAILY 90 tablet 3  . SYNTHROID 112 MCG tablet Take 1 tablet (112 mcg total) by mouth daily. 90 tablet 3   No current  facility-administered medications on file prior to visit.    Review of Systems  Constitutional: Negative for fever and chills.  HENT: Positive for congestion, facial swelling and sinus pressure. Negative for sneezing and sore throat.   Respiratory: Positive for cough. Negative for chest tightness and shortness of breath.   Neurological: Negative for headaches.      Objective:    BP 150/88 mmHg  Pulse 106  Temp(Src) 98.4 F (36.9 C) (Oral)  Resp 16  Ht 5\' 6"  (1.676 m)  Wt 186 lb (84.369 kg)  BMI 30.04 kg/m2  SpO2 94% Nursing note and vital signs reviewed.  Physical Exam  Constitutional: She is oriented to person, place, and time. She appears well-developed and well-nourished. No distress.  HENT:  Right Ear: Hearing, tympanic membrane, external ear and ear canal normal.  Left Ear: Hearing, external ear and ear canal normal.  Nose: Right sinus exhibits maxillary sinus tenderness and frontal sinus tenderness. Left sinus exhibits no maxillary sinus tenderness and no frontal sinus tenderness.  Mouth/Throat: Uvula is midline, oropharynx is clear and moist and mucous membranes are normal.  Tympanostomy tube noted in left ear.   Neck: Neck supple.  Cardiovascular: Normal rate, regular rhythm, normal heart sounds and intact distal pulses.   Pulmonary/Chest: Effort normal and breath sounds normal.  Neurological: She is alert and oriented to person, place, and time.  Skin: Skin is warm and dry.  Psychiatric: She  has a normal mood and affect. Her behavior is normal. Judgment and thought content normal.       Assessment & Plan:   Problem List Items Addressed This Visit      Respiratory   Sinusitis, acute - Primary    Symptoms and exam consistent with sinusitis most likely bacterial given new onset worsening. Start Augmentin. Start promethazine with codeine as needed for cough and sleep. Continue over-the-counter medications as needed for symptom relief and supportive care. Follow-up  if symptoms worsen or fail to improve.      Relevant Medications   amoxicillin-clavulanate (AUGMENTIN) 875-125 MG tablet   promethazine-codeine (PHENERGAN WITH CODEINE) 6.25-10 MG/5ML syrup

## 2015-08-06 NOTE — Patient Instructions (Signed)
Thank you for choosing [facility].  Summary/Instructions:  Your prescription(s) have been submitted to your pharmacy or been printed and provided for you. Please take as directed and contact our office if you believe you are having problem(s) with the medication(s) or have any questions.   If your symptoms worsen or fail to improve, please contact our office for further instruction, or in case of emergency go directly to the emergency room at the closest medical facility.   General Recommendations:    Please drink plenty of fluids.  Get plenty of rest   Sleep in humidified air  Use saline nasal sprays  Netti pot   OTC Medications:  Decongestants - helps relieve congestion   Flonase (generic fluticasone) or Nasacort (generic triamcinolone) - please make sure to use the "cross-over" technique at a 45 degree angle towards the opposite eye as opposed to straight up the nasal passageway.   If you have HIGH BLOOD PRESSURE - Coricidin HBP; AVOID any product that is -D as this contains pseudoephedrine which may increase your blood pressure.  Afrin (oxymetazoline) every 6-8 hours for up to 3 days.   Allergies - helps relieve runny nose, itchy eyes and sneezing   Claritin (generic loratidine), Allegra (fexofenidine), or Zyrtec (generic cyrterizine) for runny nose. These medications should not cause drowsiness.  Note - Benadryl (generic diphenhydramine) may be used however may cause drowsiness  Cough -   Delsym or Robitussin (generic dextromethorphan)  Expectorants - helps loosen mucus to ease removal   Mucinex (generic guaifenesin) as directed on the package.  Headaches / General Aches   Tylenol (generic acetaminophen) - DO NOT EXCEED 3 grams (3,000 mg) in a 24 hour time period  Advil/Motrin (generic ibuprofen)   Sore Throat -   Salt water gargle   Chloraseptic (generic benzocaine) spray or lozenges / Sucrets (generic dyclonine)    Sinusitis,  Adult Sinusitis is redness, soreness, and inflammation of the paranasal sinuses. Paranasal sinuses are air pockets within the bones of your face. They are located beneath your eyes, in the middle of your forehead, and above your eyes. In healthy paranasal sinuses, mucus is able to drain out, and air is able to circulate through them by way of your nose. However, when your paranasal sinuses are inflamed, mucus and air can become trapped. This can allow bacteria and other germs to grow and cause infection. Sinusitis can develop quickly and last only a short time (acute) or continue over a long period (chronic). Sinusitis that lasts for more than 12 weeks is considered chronic. CAUSES Causes of sinusitis include:  Allergies.  Structural abnormalities, such as displacement of the cartilage that separates your nostrils (deviated septum), which can decrease the air flow through your nose and sinuses and affect sinus drainage.  Functional abnormalities, such as when the small hairs (cilia) that line your sinuses and help remove mucus do not work properly or are not present. SIGNS AND SYMPTOMS Symptoms of acute and chronic sinusitis are the same. The primary symptoms are pain and pressure around the affected sinuses. Other symptoms include:  Upper toothache.  Earache.  Headache.  Bad breath.  Decreased sense of smell and taste.  A cough, which worsens when you are lying flat.  Fatigue.  Fever.  Thick drainage from your nose, which often is green and may contain pus (purulent).  Swelling and warmth over the affected sinuses. DIAGNOSIS Your health care provider will perform a physical exam. During your exam, your health care provider may perform any  of the following to help determine if you have acute sinusitis or chronic sinusitis:  Look in your nose for signs of abnormal growths in your nostrils (nasal polyps).  Tap over the affected sinus to check for signs of infection.  View the  inside of your sinuses using an imaging device that has a light attached (endoscope). If your health care provider suspects that you have chronic sinusitis, one or more of the following tests may be recommended:  Allergy tests.  Nasal culture. A sample of mucus is taken from your nose, sent to a lab, and screened for bacteria.  Nasal cytology. A sample of mucus is taken from your nose and examined by your health care provider to determine if your sinusitis is related to an allergy. TREATMENT Most cases of acute sinusitis are related to a viral infection and will resolve on their own within 10 days. Sometimes, medicines are prescribed to help relieve symptoms of both acute and chronic sinusitis. These may include pain medicines, decongestants, nasal steroid sprays, or saline sprays. However, for sinusitis related to a bacterial infection, your health care provider will prescribe antibiotic medicines. These are medicines that will help kill the bacteria causing the infection. Rarely, sinusitis is caused by a fungal infection. In these cases, your health care provider will prescribe antifungal medicine. For some cases of chronic sinusitis, surgery is needed. Generally, these are cases in which sinusitis recurs more than 3 times per year, despite other treatments. HOME CARE INSTRUCTIONS  Drink plenty of water. Water helps thin the mucus so your sinuses can drain more easily.  Use a humidifier.  Inhale steam 3-4 times a day (for example, sit in the bathroom with the shower running).  Apply a warm, moist washcloth to your face 3-4 times a day, or as directed by your health care provider.  Use saline nasal sprays to help moisten and clean your sinuses.  Take medicines only as directed by your health care provider.  If you were prescribed either an antibiotic or antifungal medicine, finish it all even if you start to feel better. SEEK IMMEDIATE MEDICAL CARE IF:  You have increasing pain or  severe headaches.  You have nausea, vomiting, or drowsiness.  You have swelling around your face.  You have vision problems.  You have a stiff neck.  You have difficulty breathing.   This information is not intended to replace advice given to you by your health care provider. Make sure you discuss any questions you have with your health care provider.   Document Released: 09/25/2005 Document Revised: 10/16/2014 Document Reviewed: 10/10/2011 Elsevier Interactive Patient Education [employer].

## 2015-10-08 ENCOUNTER — Telehealth: Payer: Self-pay | Admitting: *Deleted

## 2015-10-08 MED ORDER — LISINOPRIL-HYDROCHLOROTHIAZIDE 20-12.5 MG PO TABS: 1.0000 | ORAL_TABLET | Freq: Every day | ORAL | Status: DC

## 2015-10-08 NOTE — Telephone Encounter (Signed)
Received call pt states she is needing refill on her Losartan. Verified pharmacy inform will send to [facility]...Johny Chess

## 2015-10-27 ENCOUNTER — Other Ambulatory Visit: Payer: Self-pay | Admitting: Geriatric Medicine

## 2015-10-27 ENCOUNTER — Telehealth: Payer: Self-pay | Admitting: Internal Medicine

## 2015-10-27 MED ORDER — SYNTHROID 112 MCG PO TABS: 112.0000 ug | ORAL_TABLET | Freq: Every day | ORAL | Status: DC

## 2015-10-27 MED ORDER — LISINOPRIL-HYDROCHLOROTHIAZIDE 20-12.5 MG PO TABS: 1.0000 | ORAL_TABLET | Freq: Every day | ORAL | Status: DC

## 2015-10-27 MED ORDER — SIMVASTATIN 40 MG PO TABS: ORAL_TABLET | ORAL | Status: DC

## 2015-10-27 NOTE — Telephone Encounter (Signed)
Prescriptions sent to [facility].

## 2015-10-27 NOTE — Telephone Encounter (Signed)
Patient called to advise that her insurance has changed to [place]. i have updated this in her demographics. [facility] [place] # [id number] -RX PCN# [id number]  [facility]  Ph# [id number]  Needs the following sent in to the new pharmacy   simvastatin (ZOCOR) 40 MG tablet [id number]  lisinopril-hydrochlorothiazide (ZESTORETIC) 20-12.5 MG tablet [id number]  SYNTHROID 112 MCG tablet [id number]

## 2015-11-30 ENCOUNTER — Ambulatory Visit (INDEPENDENT_AMBULATORY_CARE_PROVIDER_SITE_OTHER): Payer: Medicare HMO | Admitting: Physician Assistant

## 2015-11-30 ENCOUNTER — Encounter: Payer: Self-pay | Admitting: Physician Assistant

## 2015-11-30 VITALS — BP 140/73 | HR 102 | Temp 98.4°F | Ht 66.0 in | Wt 188.4 lb

## 2015-11-30 DIAGNOSIS — J020 Streptococcal pharyngitis: Secondary | ICD-10-CM | POA: Diagnosis not present

## 2015-11-30 MED ORDER — AMOXICILLIN 500 MG PO CAPS: 500.0000 mg | ORAL_CAPSULE | Freq: Two times a day (BID) | ORAL | Status: DC

## 2015-11-30 NOTE — Progress Notes (Signed)
Pre visit review using our clinic review tool, if applicable. No additional management support is needed unless otherwise documented below in the visit note. 

## 2015-11-30 NOTE — Assessment & Plan Note (Signed)
Rapid strep +. Rx Amoxicillin. Supportive measures reviewed. Follow-up if symptoms are not resolving.

## 2015-11-30 NOTE — Progress Notes (Signed)
Patient presents to clinic today c/o 3 days of worsening sore throat with swollen tonsils. Endorses intermittent fever and chills. Denies cough or chest congestion. Denies history of strep throat. Is unsure of sick contact but has many grandchildren.  Past Medical History  Diagnosis Date  . Depression   . Hypertension   . Hypothyroidism   . Acne   . Breast mass, right   . GERD (gastroesophageal reflux disease)   . Hypercholesteremia   . Diverticulosis   . Cholelithiasis   . H. pylori infection     Current Outpatient Prescriptions on File Prior to Visit  Medication Sig Dispense Refill  . Calcium Carbonate-Vitamin D (CALCIUM + D PO) Take by mouth.    . fluticasone (FLONASE) 50 MCG/ACT nasal spray Place 1 spray into the nose 2 (two) times daily. 16 g 6  . latanoprost (XALATAN) 0.005 % ophthalmic solution Place 1 drop into both eyes at bedtime.    Marland Kitchen lisinopril-hydrochlorothiazide (ZESTORETIC) 20-12.5 MG tablet Take 1 tablet by mouth daily. 90 tablet 3  . montelukast (SINGULAIR) 10 MG tablet Take 10 mg by mouth at bedtime.    . Multiple Vitamins-Minerals (MULTIVITAMIN PO) Take by mouth.    . simvastatin (ZOCOR) 40 MG tablet TAKE 1 TABLET BY MOUTH ONCE DAILY 90 tablet 3  . SYNTHROID 112 MCG tablet Take 1 tablet (112 mcg total) by mouth daily. 90 tablet 3  . omeprazole (PRILOSEC) 20 MG capsule Take 1 capsule (20 mg total) by mouth daily. (Patient not taking: Reported on 11/30/2015) 30 capsule 6  . promethazine-codeine (PHENERGAN WITH CODEINE) 6.25-10 MG/5ML syrup Take 5 mLs by mouth every 6 (six) hours as needed for cough. (Patient not taking: Reported on 11/30/2015) 180 mL 0   No current facility-administered medications on file prior to visit.    No Known Allergies  Family History  Problem Relation Age of Onset  . Other Father 73    deceased, unknown causes in 02-07-23  . Hypertension Mother   . Other Mother 46    SBO, deceased  . Breast cancer Neg Hx   . Colon cancer Neg Hx   .  Thyroid disease Brother   . Thyroid disease Sister     Social History   Social History  . Marital Status: Single    Spouse Name: N/A  . Number of Children: N/A  . Years of Education: N/A   Occupational History  . bookkeeper    Social History Main Topics  . Smoking status: Never Smoker   . Smokeless tobacco: Never Used  . Alcohol Use: No  . Drug Use: No  . Sexual Activity: Not Asked   Other Topics Concern  . None   Social History Narrative   Married '72-widowed '97   HSG   1 daughter 02-07-1980, 1 son 02-06-1982: son going thru divorce ['09]; applying to medical school   Work: bookkeeper [employer]   Review of Systems - See HPI.  All other ROS are negative.  BP 140/73 mmHg  Pulse 102  Temp(Src) 98.4 F (36.9 C) (Oral)  Ht 5\' 6"  (1.676 m)  Wt 188 lb 6.4 oz (85.458 kg)  BMI 30.42 kg/m2  SpO2 98%  Physical Exam  Constitutional: She is oriented to person, place, and time and well-developed, well-nourished, and in no distress.  HENT:  Head: Normocephalic and atraumatic.  Right Ear: Tympanic membrane normal.  Ears:  Nose: Nose normal.  Mouth/Throat: Uvula is midline. Oropharyngeal exudate, posterior oropharyngeal edema and posterior oropharyngeal erythema present.  No tonsillar abscesses.  Eyes: Conjunctivae are normal.  Neck: Neck supple.  Cardiovascular: Normal rate, regular rhythm, normal heart sounds and intact distal pulses.   Pulmonary/Chest: Effort normal and breath sounds normal. No respiratory distress. She has no wheezes. She has no rales. She exhibits no tenderness.  Neurological: She is alert and oriented to person, place, and time.  Vitals reviewed.   No results found for this or any previous visit (from the past 2160 hour(s)).  Assessment/Plan: Streptococcal sore throat Rapid strep +. Rx Amoxicillin. Supportive measures reviewed. Follow-up if symptoms are not resolving.

## 2015-11-30 NOTE — Patient Instructions (Signed)
Please take antibiotic as directed. Increase fluids.  Only take antibiotic with food on the stomach. Salt water gargles and losenges will help throat.  Place a humidifier in the bedroom.  Strep Throat Strep throat is a bacterial infection of the throat. Your health care provider may call the infection tonsillitis or pharyngitis, depending on whether there is swelling in the tonsils or at the back of the throat. Strep throat is most common during the cold months of the year in children who are 69-59 years of age, but it can happen during any season in people of any age. This infection is spread from person to person (contagious) through coughing, sneezing, or close contact. CAUSES Strep throat is caused by the bacteria called Streptococcus pyogenes. RISK FACTORS This condition is more likely to develop in:  People who spend time in crowded places where the infection can spread easily.  People who have close contact with someone who has strep throat. SYMPTOMS Symptoms of this condition include:  Fever or chills.   Redness, swelling, or pain in the tonsils or throat.  Pain or difficulty when swallowing.  White or yellow spots on the tonsils or throat.  Swollen, tender glands in the neck or under the jaw.  Red rash all over the body (rare). DIAGNOSIS This condition is diagnosed by performing a rapid strep test or by taking a swab of your throat (throat culture test). Results from a rapid strep test are usually ready in a few minutes, but throat culture test results are available after one or two days. TREATMENT This condition is treated with antibiotic medicine. HOME CARE INSTRUCTIONS Medicines  Take over-the-counter and prescription medicines only as told by your health care provider.  Take your antibiotic as told by your health care provider. Do not stop taking the antibiotic even if you start to feel better.  Have family members who also have a sore throat or fever tested for  strep throat. They may need antibiotics if they have the strep infection. Eating and Drinking  Do not share food, drinking cups, or personal items that could cause the infection to spread to other people.  If swallowing is difficult, try eating soft foods until your sore throat feels better.  Drink enough fluid to keep your urine clear or pale yellow. General Instructions  Gargle with a salt-water mixture 3-4 times per day or as needed. To make a salt-water mixture, completely dissolve -1 tsp of salt in 1 cup of warm water.  Make sure that all household members wash their hands well.  Get plenty of rest.  Stay home from school or work until you have been taking antibiotics for 24 hours.  Keep all follow-up visits as told by your health care provider. This is important. SEEK MEDICAL CARE IF:  The glands in your neck continue to get bigger.  You develop a rash, cough, or earache.  You cough up a thick liquid that is green, yellow-brown, or bloody.  You have pain or discomfort that does not get better with medicine.  Your problems seem to be getting worse rather than better.  You have a fever. SEEK IMMEDIATE MEDICAL CARE IF:  You have new symptoms, such as vomiting, severe headache, stiff or painful neck, chest pain, or shortness of breath.  You have severe throat pain, drooling, or changes in your voice.  You have swelling of the neck, or the skin on the neck becomes red and tender.  You have signs of dehydration, such as fatigue,  dry mouth, and decreased urination.  You become increasingly sleepy, or you cannot wake up completely.  Your joints become red or painful.   This information is not intended to replace advice given to you by your health care provider. Make sure you discuss any questions you have with your health care provider.   Document Released: 09/22/2000 Document Revised: 06/16/2015 Document Reviewed: 01/18/2015 Elsevier Interactive Patient Education  [employer].

## 2015-12-20 DIAGNOSIS — H7312 Chronic myringitis, left ear: Secondary | ICD-10-CM | POA: Diagnosis not present

## 2016-01-08 DIAGNOSIS — 419620001 Death: Secondary | SNOMED CT | POA: Diagnosis not present

## 2016-01-08 DEATH — deceased

## 2016-01-12 DIAGNOSIS — H40012 Open angle with borderline findings, low risk, left eye: Secondary | ICD-10-CM | POA: Diagnosis not present

## 2016-01-12 DIAGNOSIS — H40011 Open angle with borderline findings, low risk, right eye: Secondary | ICD-10-CM | POA: Diagnosis not present

## 2016-01-27 ENCOUNTER — Other Ambulatory Visit: Payer: Self-pay | Admitting: Geriatric Medicine

## 2016-01-27 ENCOUNTER — Telehealth: Payer: Self-pay | Admitting: Internal Medicine

## 2016-01-27 MED ORDER — SYNTHROID 112 MCG PO TABS: 112.0000 ug | ORAL_TABLET | Freq: Every day | ORAL | Status: DC

## 2016-01-27 MED ORDER — LISINOPRIL-HYDROCHLOROTHIAZIDE 20-12.5 MG PO TABS: 1.0000 | ORAL_TABLET | Freq: Every day | ORAL | Status: DC

## 2016-01-27 MED ORDER — SIMVASTATIN 40 MG PO TABS: ORAL_TABLET | ORAL | Status: DC

## 2016-01-27 NOTE — Telephone Encounter (Signed)
Pt called in and needs refill for thyriod and bp meds and her simvastatin  refilled    All three needs to be sent to :   She wants it sent to [facility] [address]

## 2016-01-27 NOTE — Telephone Encounter (Signed)
Sent to pharmacy 

## 2016-02-07 DIAGNOSIS — 419620001 Death: Secondary | SNOMED CT | POA: Diagnosis not present

## 2016-02-07 DEATH — deceased

## 2016-02-23 DIAGNOSIS — H40011 Open angle with borderline findings, low risk, right eye: Secondary | ICD-10-CM | POA: Diagnosis not present

## 2016-03-09 DIAGNOSIS — 419620001 Death: Secondary | SNOMED CT | POA: Diagnosis not present

## 2016-03-09 DEATH — deceased

## 2016-04-20 DIAGNOSIS — H7312 Chronic myringitis, left ear: Secondary | ICD-10-CM | POA: Diagnosis not present

## 2016-04-20 DIAGNOSIS — H6982 Other specified disorders of Eustachian tube, left ear: Secondary | ICD-10-CM | POA: Diagnosis not present

## 2016-04-24 ENCOUNTER — Other Ambulatory Visit: Payer: Self-pay | Admitting: *Deleted

## 2016-04-24 MED ORDER — SYNTHROID 112 MCG PO TABS: 112.0000 ug | ORAL_TABLET | Freq: Every day | ORAL | Status: DC

## 2016-05-09 DIAGNOSIS — 419620001 Death: Secondary | SNOMED CT | POA: Diagnosis not present

## 2016-05-09 DEATH — deceased

## 2016-06-13 ENCOUNTER — Encounter: Payer: Self-pay | Admitting: Internal Medicine

## 2016-06-13 ENCOUNTER — Ambulatory Visit (INDEPENDENT_AMBULATORY_CARE_PROVIDER_SITE_OTHER): Payer: Medicare HMO | Admitting: Internal Medicine

## 2016-06-13 DIAGNOSIS — K5909 Other constipation: Secondary | ICD-10-CM | POA: Insufficient documentation

## 2016-06-13 DIAGNOSIS — I10 Essential (primary) hypertension: Secondary | ICD-10-CM | POA: Diagnosis not present

## 2016-06-13 DIAGNOSIS — K5900 Constipation, unspecified: Secondary | ICD-10-CM | POA: Diagnosis not present

## 2016-06-13 DIAGNOSIS — R1032 Left lower quadrant pain: Secondary | ICD-10-CM

## 2016-06-13 MED ORDER — TRAMADOL HCL 50 MG PO TABS: 50.0000 mg | ORAL_TABLET | Freq: Three times a day (TID) | ORAL | 0 refills | Status: DC | PRN

## 2016-06-13 MED ORDER — CEFTRIAXONE SODIUM 1 G IJ SOLR
1.0000 g | Freq: Once | INTRAMUSCULAR | Status: AC
  Administered 2016-06-13: 1 g via INTRAMUSCULAR

## 2016-06-13 MED ORDER — METRONIDAZOLE 500 MG PO TABS: 500.0000 mg | ORAL_TABLET | Freq: Three times a day (TID) | ORAL | 0 refills | Status: DC

## 2016-06-13 MED ORDER — LEVOFLOXACIN 500 MG PO TABS: 500.0000 mg | ORAL_TABLET | Freq: Every day | ORAL | 0 refills | Status: AC

## 2016-06-13 NOTE — Assessment & Plan Note (Signed)
Non toxic, but presumed recurrent diverticulitis, I think we can hold on imaging, but did ask for labs to be done tomorrow, today then for rocephin IM x 1, then levaquin/flagyl (had nausea with cipro last time), pain control, and refer GI (also due for colonoscopy)

## 2016-06-13 NOTE — Assessment & Plan Note (Signed)
stable overall by history and exam, recent data reviewed with pt, and pt to continue medical treatment as before,  to f/u any worsening symptoms or concerns BP Readings from Last 3 Encounters:  06/13/16 130/74  11/30/15 140/73  08/06/15 (!) 150/88

## 2016-06-13 NOTE — Progress Notes (Signed)
Pre visit review using our clinic review tool, if applicable. No additional management support is needed unless otherwise documented below in the visit note. 

## 2016-06-13 NOTE — Progress Notes (Signed)
Subjective:    Patient ID: Sonya Brady, female    DOB: 05/12/1951, 65 y.o.   MRN: [id number]  HPI  Here with acute onset 2 days LLQ pain with radiation to the back, feverish, bloating, nausea and acute on chronic difficulty with constipation.   Has Hx of several episodes similar in past.  No recent CT or imaging. Did have nausea with cipro in past, asks for different med.  No recent colonoscopy - due for f/u colonoscopy, last one 2007 with Dr Sharlett Iles with diverticulosis only.  Denies worsening reflux, abd pain, dysphagia, n/v, bowel change or blood.  Pt denies chest pain, increased sob or doe, wheezing, orthopnea, PND, increased LE swelling, palpitations, dizziness or syncope. Past Medical History:  Diagnosis Date  . Acne   . Breast mass, right   . Cholelithiasis   . Depression   . Diverticulosis   . GERD (gastroesophageal reflux disease)   . H. pylori infection   . Hypercholesteremia   . Hypertension   . Hypothyroidism    Past Surgical History:  Procedure Laterality Date  . ABDOMINAL HYSTERECTOMY     both ovary intact  . TYMPANOPLASTY     tube left ear    reports that she has never smoked. She has never used smokeless tobacco. She reports that she does not drink alcohol or use drugs. family history includes Hypertension in her mother; Other (age of onset: 90) in her father; Other (age of onset: 5) in her mother; Thyroid disease in her brother and sister. No Known Allergies Current Outpatient Prescriptions on File Prior to Visit  Medication Sig Dispense Refill  . Calcium Carbonate-Vitamin D (CALCIUM + D PO) Take by mouth.    . clindamycin (CLINDAGEL) 1 % gel APPLY TOPICALLY    . fluticasone (FLONASE) 50 MCG/ACT nasal spray Place 1 spray into the nose 2 (two) times daily. 16 g 6  . latanoprost (XALATAN) 0.005 % ophthalmic solution Place 1 drop into both eyes at bedtime.    Marland Kitchen lisinopril-hydrochlorothiazide (ZESTORETIC) 20-12.5 MG tablet Take 1 tablet by mouth daily. 90 tablet  3  . montelukast (SINGULAIR) 10 MG tablet Take 10 mg by mouth at bedtime.    . Multiple Vitamins-Minerals (MULTIVITAMIN PO) Take by mouth.    Marland Kitchen omeprazole (PRILOSEC) 20 MG capsule Take 1 capsule (20 mg total) by mouth daily. (Patient not taking: Reported on 11/30/2015) 30 capsule 6  . simvastatin (ZOCOR) 40 MG tablet TAKE 1 TABLET BY MOUTH ONCE DAILY 90 tablet 3  . SYNTHROID 112 MCG tablet Take 1 tablet (112 mcg total) by mouth daily. Yearly physical w/labs due in sept must see MD for future refills 90 tablet 0   No current facility-administered medications on file prior to visit.     Review of Systems  Constitutional: Negative for unusual diaphoresis or night sweats HENT: Negative for ear swelling or discharge Eyes: Negative for worsening visual haziness  Respiratory: Negative for choking and stridor.   Gastrointestinal: Negative for distension or worsening eructation Genitourinary: Negative for retention or change in urine volume.  Musculoskeletal: Negative for other MSK pain or swelling Skin: Negative for color change and worsening wound Neurological: Negative for tremors and numbness other than noted  Psychiatric/Behavioral: Negative for decreased concentration or agitation other than above       Objective:   Physical Exam BP 130/74   Pulse 84   Temp 97.9 F (36.6 C) (Oral)   Resp 20   Wt 189 lb (85.7 kg)   SpO2  97%   BMI 30.51 kg/m  VS noted,  Constitutional: Pt appears in no apparent distress HENT: Head: NCAT.  Right Ear: External ear normal.  Left Ear: External ear normal.  Eyes: . Pupils are equal, round, and reactive to light. Conjunctivae and EOM are normal Neck: Normal range of motion. Neck supple.  Cardiovascular: Normal rate and regular rhythm.   Pulmonary/Chest: Effort normal and breath sounds without rales or wheezing.  Abd:  Soft, with LLQ tender, ND, + BS, no guarding or rebound Neurological: Pt is alert. Not confused , motor grossly intact Skin: Skin is  warm. No rash, no LE edema Psychiatric: Pt behavior is normal. No agitation.     Assessment & Plan:

## 2016-06-13 NOTE — Assessment & Plan Note (Signed)
Ok to start miralax daily,  to f/u any worsening symptoms or concerns

## 2016-06-13 NOTE — Patient Instructions (Addendum)
You had the antibiotic shot today  Please take all new medication as prescribed - the levaquin and flagyl (antibiotics), pain medication if needed (tramadol)  Please also start daily Miralax OTC 17 mg by mouth every day for the constipation  You will be contacted regarding the referral for: Gastroenterology  Please go to the LAB in the Basement (turn left off the elevator) for the tests to be done tomorrow  You will be contacted by phone if any changes need to be made immediately.  Otherwise, you will receive a letter about your results with an explanation, but please check with MyChart first.  Please continue all other medications as before, and refills have been done if requested.  Please have the pharmacy call with any other refills you may need.  Please keep your appointments with your specialists as you may have planned

## 2016-06-15 ENCOUNTER — Telehealth: Payer: Self-pay

## 2016-06-15 ENCOUNTER — Other Ambulatory Visit (INDEPENDENT_AMBULATORY_CARE_PROVIDER_SITE_OTHER): Payer: Medicare HMO

## 2016-06-15 DIAGNOSIS — R1032 Left lower quadrant pain: Secondary | ICD-10-CM | POA: Diagnosis not present

## 2016-06-15 DIAGNOSIS — Z0000 Encounter for general adult medical examination without abnormal findings: Secondary | ICD-10-CM

## 2016-06-15 LAB — URINALYSIS, ROUTINE W REFLEX MICROSCOPIC
Bilirubin Urine: NEGATIVE
Hgb urine dipstick: NEGATIVE
Ketones, ur: NEGATIVE
Leukocytes, UA: NEGATIVE
Nitrite: NEGATIVE
PH: 5.5 (ref 5.0–8.0)
RBC / HPF: NONE SEEN (ref 0–?)
Total Protein, Urine: NEGATIVE
Urine Glucose: NEGATIVE
Urobilinogen, UA: 0.2 (ref 0.0–1.0)
WBC, UA: NONE SEEN (ref 0–?)

## 2016-06-15 LAB — HEPATIC FUNCTION PANEL
ALBUMIN: 4.6 g/dL (ref 3.5–5.2)
ALK PHOS: 73 U/L (ref 39–117)
ALT: 24 U/L (ref 0–35)
AST: 19 U/L (ref 0–37)
BILIRUBIN DIRECT: 0.1 mg/dL (ref 0.0–0.3)
BILIRUBIN TOTAL: 0.5 mg/dL (ref 0.2–1.2)
Total Protein: 7.8 g/dL (ref 6.0–8.3)

## 2016-06-15 LAB — BASIC METABOLIC PANEL
BUN: 15 mg/dL (ref 6–23)
CALCIUM: 9.3 mg/dL (ref 8.4–10.5)
CO2: 27 mEq/L (ref 19–32)
Chloride: 100 mEq/L (ref 96–112)
Creatinine, Ser: 0.76 mg/dL (ref 0.40–1.20)
GFR: 80.76 mL/min (ref >60.00)
GLUCOSE: 136 mg/dL — AB (ref 70–99)
POTASSIUM: 4 meq/L (ref 3.5–5.1)
SODIUM: 133 meq/L — AB (ref 135–145)

## 2016-06-15 LAB — CBC WITH DIFFERENTIAL/PLATELET
BASOS PCT: 0.7 % (ref 0.0–3.0)
Basophils Absolute: 0 10*3/uL (ref 0.0–0.1)
EOS PCT: 2 % (ref 0.0–5.0)
Eosinophils Absolute: 0.1 10*3/uL (ref 0.0–0.7)
HCT: 42.3 % (ref 36.0–46.0)
HEMOGLOBIN: 14.6 g/dL (ref 12.0–15.0)
LYMPHS ABS: 3.2 10*3/uL (ref 0.7–4.0)
Lymphocytes Relative: 51.8 % — ABNORMAL HIGH (ref 12.0–46.0)
MCHC: 34.5 g/dL (ref 30.0–36.0)
MCV: 86.1 fl (ref 78.0–100.0)
MONOS PCT: 10.8 % (ref 3.0–12.0)
Monocytes Absolute: 0.7 10*3/uL (ref 0.1–1.0)
NEUTROS PCT: 34.7 % — AB (ref 43.0–77.0)
Neutro Abs: 2.1 10*3/uL (ref 1.4–7.7)
Platelets: 271 10*3/uL (ref 150.0–400.0)
RBC: 4.92 Mil/uL (ref 3.87–5.11)
RDW: 12.9 % (ref 11.5–15.5)
WBC: 6.2 10*3/uL (ref 4.0–10.5)

## 2016-06-15 LAB — LIPASE: LIPASE: 32 U/L (ref 11.0–59.0)

## 2016-06-15 NOTE — Telephone Encounter (Signed)
Order placed

## 2016-07-18 ENCOUNTER — Ambulatory Visit (INDEPENDENT_AMBULATORY_CARE_PROVIDER_SITE_OTHER): Payer: Medicare HMO | Admitting: Internal Medicine

## 2016-07-18 ENCOUNTER — Other Ambulatory Visit (INDEPENDENT_AMBULATORY_CARE_PROVIDER_SITE_OTHER): Payer: Medicare HMO

## 2016-07-18 ENCOUNTER — Encounter: Payer: Self-pay | Admitting: Internal Medicine

## 2016-07-18 VITALS — BP 132/72 | HR 79 | Temp 98.4°F | Resp 12 | Ht 66.0 in | Wt 188.0 lb

## 2016-07-18 DIAGNOSIS — E039 Hypothyroidism, unspecified: Secondary | ICD-10-CM

## 2016-07-18 DIAGNOSIS — Z0000 Encounter for general adult medical examination without abnormal findings: Secondary | ICD-10-CM

## 2016-07-18 DIAGNOSIS — I10 Essential (primary) hypertension: Secondary | ICD-10-CM | POA: Diagnosis not present

## 2016-07-18 DIAGNOSIS — Z23 Encounter for immunization: Secondary | ICD-10-CM

## 2016-07-18 DIAGNOSIS — E785 Hyperlipidemia, unspecified: Secondary | ICD-10-CM

## 2016-07-18 LAB — LIPID PANEL
CHOL/HDL RATIO: 5
CHOLESTEROL: 207 mg/dL — AB (ref 0–200)
HDL: 41.2 mg/dL (ref >39.00)
LDL CALC: 132 mg/dL — AB (ref 0–99)
NonHDL: 166.04
TRIGLYCERIDES: 172 mg/dL — AB (ref 0.0–149.0)
VLDL: 34.4 mg/dL (ref 0.0–40.0)

## 2016-07-18 LAB — TSH: TSH: 3.7 u[IU]/mL (ref 0.35–4.50)

## 2016-07-18 LAB — HEMOGLOBIN A1C: HEMOGLOBIN A1C: 7.8 % — AB (ref 4.6–6.5)

## 2016-07-18 MED ORDER — FLUTICASONE PROPIONATE 50 MCG/ACT NA SUSP: 1.0000 | Freq: Two times a day (BID) | NASAL | 3 refills | Status: DC

## 2016-07-18 MED ORDER — LEVOTHYROXINE SODIUM 125 MCG PO TABS: 125.0000 ug | ORAL_TABLET | Freq: Every day | ORAL | 3 refills | Status: DC

## 2016-07-18 NOTE — Patient Instructions (Signed)
We have given you the last pneumonia shot today and are checking the blood work.   We have sent in the generic of the thyroid medicine at a little bit higher dose.   Keep up the good work with exercise and diet.   After the 1st of the year call the GI office to get the colon cancer screening done.   Health Maintenance, Female Adopting a healthy lifestyle and getting preventive care can go a long way to promote health and wellness. Talk with your health care provider about what schedule of regular examinations is right for you. This is a good chance for you to check in with your provider about disease prevention and staying healthy. In between checkups, there are plenty of things you can do on your own. Experts have done a lot of research about which lifestyle changes and preventive measures are most likely to keep you healthy. Ask your health care provider for more information. WEIGHT AND DIET  Eat a healthy diet  Be sure to include plenty of vegetables, fruits, low-fat dairy products, and lean protein.  Do not eat a lot of foods high in solid fats, added sugars, or salt.  Get regular exercise. This is one of the most important things you can do for your health.  Most adults should exercise for at least 150 minutes each week. The exercise should increase your heart rate and make you sweat (moderate-intensity exercise).  Most adults should also do strengthening exercises at least twice a week. This is in addition to the moderate-intensity exercise.  Maintain a healthy weight  Body mass index (BMI) is a measurement that can be used to identify possible weight problems. It estimates body fat based on height and weight. Your health care provider can help determine your BMI and help you achieve or maintain a healthy weight.  For females 46 years of age and older:   A BMI below 18.5 is considered underweight.  A BMI of 18.5 to 24.9 is normal.  A BMI of 25 to 29.9 is considered  overweight.  A BMI of 30 and above is considered obese.  Watch levels of cholesterol and blood lipids  You should start having your blood tested for lipids and cholesterol at 66 years of age, then have this test every 5 years.  You may need to have your cholesterol levels checked more often if:  Your lipid or cholesterol levels are high.  You are older than 66 years of age.  You are at high risk for heart disease.  CANCER SCREENING   Lung Cancer  Lung cancer screening is recommended for adults 11-73 years old who are at high risk for lung cancer because of a history of smoking.  A yearly low-dose CT scan of the lungs is recommended for people who:  Currently smoke.  Have quit within the past 15 years.  Have at least a 30-pack-year history of smoking. A pack year is smoking an average of one pack of cigarettes a day for 1 year.  Yearly screening should continue until it has been 15 years since you quit.  Yearly screening should stop if you develop a health problem that would prevent you from having lung cancer treatment.  Breast Cancer  Practice breast self-awareness. This means understanding how your breasts normally appear and feel.  It also means doing regular breast self-exams. Let your health care provider know about any changes, no matter how small.  If you are in your 20s or 30s, you  should have a clinical breast exam (CBE) by a health care provider every 1-3 years as part of a regular health exam.  If you are 31 or older, have a CBE every year. Also consider having a breast X-ray (mammogram) every year.  If you have a family history of breast cancer, talk to your health care provider about genetic screening.  If you are at high risk for breast cancer, talk to your health care provider about having an MRI and a mammogram every year.  Breast cancer gene (BRCA) assessment is recommended for women who have family members with BRCA-related cancers. BRCA-related  cancers include:  Breast.  Ovarian.  Tubal.  Peritoneal cancers.  Results of the assessment will determine the need for genetic counseling and BRCA1 and BRCA2 testing. Cervical Cancer Your health care provider may recommend that you be screened regularly for cancer of the pelvic organs (ovaries, uterus, and vagina). This screening involves a pelvic examination, including checking for microscopic changes to the surface of your cervix (Pap test). You may be encouraged to have this screening done every 3 years, beginning at age 53.  For women ages 8-65, health care providers may recommend pelvic exams and Pap testing every 3 years, or they may recommend the Pap and pelvic exam, combined with testing for human papilloma virus (HPV), every 5 years. Some types of HPV increase your risk of cervical cancer. Testing for HPV may also be done on women of any age with unclear Pap test results.  Other health care providers may not recommend any screening for nonpregnant women who are considered low risk for pelvic cancer and who do not have symptoms. Ask your health care provider if a screening pelvic exam is right for you.  If you have had past treatment for cervical cancer or a condition that could lead to cancer, you need Pap tests and screening for cancer for at least 20 years after your treatment. If Pap tests have been discontinued, your risk factors (such as having a new sexual partner) need to be reassessed to determine if screening should resume. Some women have medical problems that increase the chance of getting cervical cancer. In these cases, your health care provider may recommend more frequent screening and Pap tests. Colorectal Cancer  This type of cancer can be detected and often prevented.  Routine colorectal cancer screening usually begins at 66 years of age and continues through 66 years of age.  Your health care provider may recommend screening at an earlier age if you have risk  factors for colon cancer.  Your health care provider may also recommend using home test kits to check for hidden blood in the stool.  A small camera at the end of a tube can be used to examine your colon directly (sigmoidoscopy or colonoscopy). This is done to check for the earliest forms of colorectal cancer.  Routine screening usually begins at age 11.  Direct examination of the colon should be repeated every 5-10 years through 66 years of age. However, you may need to be screened more often if early forms of precancerous polyps or small growths are found. Skin Cancer  Check your skin from head to toe regularly.  Tell your health care provider about any new moles or changes in moles, especially if there is a change in a mole's shape or color.  Also tell your health care provider if you have a mole that is larger than the size of a pencil eraser.  Always use sunscreen.  Apply sunscreen liberally and repeatedly throughout the day.  Protect yourself by wearing long sleeves, pants, a wide-brimmed hat, and sunglasses whenever you are outside. HEART DISEASE, DIABETES, AND HIGH BLOOD PRESSURE   High blood pressure causes heart disease and increases the risk of stroke. High blood pressure is more likely to develop in:  People who have blood pressure in the high end of the normal range (130-139/85-89 mm Hg).  People who are overweight or obese.  People who are African American.  If you are 96-41 years of age, have your blood pressure checked every 3-5 years. If you are 12 years of age or older, have your blood pressure checked every year. You should have your blood pressure measured twice--once when you are at a hospital or clinic, and once when you are not at a hospital or clinic. Record the average of the two measurements. To check your blood pressure when you are not at a hospital or clinic, you can use:  An automated blood pressure machine at a pharmacy.  A home blood pressure  monitor.  If you are between 1 years and 15 years old, ask your health care provider if you should take aspirin to prevent strokes.  Have regular diabetes screenings. This involves taking a blood sample to check your fasting blood sugar level.  If you are at a normal weight and have a low risk for diabetes, have this test once every three years after 66 years of age.  If you are overweight and have a high risk for diabetes, consider being tested at a younger age or more often. PREVENTING INFECTION  Hepatitis B  If you have a higher risk for hepatitis B, you should be screened for this virus. You are considered at high risk for hepatitis B if:  You were born in a country where hepatitis B is common. Ask your health care provider which countries are considered high risk.  Your parents were born in a high-risk country, and you have not been immunized against hepatitis B (hepatitis B vaccine).  You have HIV or AIDS.  You use needles to inject street drugs.  You live with someone who has hepatitis B.  You have had sex with someone who has hepatitis B.  You get hemodialysis treatment.  You take certain medicines for conditions, including cancer, organ transplantation, and autoimmune conditions. Hepatitis C  Blood testing is recommended for:  Everyone born from [birth year] through 1966.  Anyone with known risk factors for hepatitis C. Sexually transmitted infections (STIs)  You should be screened for sexually transmitted infections (STIs) including gonorrhea and chlamydia if:  You are sexually active and are younger than 66 years of age.  You are older than 66 years of age and your health care provider tells you that you are at risk for this type of infection.  Your sexual activity has changed since you were last screened and you are at an increased risk for chlamydia or gonorrhea. Ask your health care provider if you are at risk.  If you do not have HIV, but are at risk, it may be  recommended that you take a prescription medicine daily to prevent HIV infection. This is called pre-exposure prophylaxis (PrEP). You are considered at risk if:  You are sexually active and do not regularly use condoms or know the HIV status of your partner(s).  You take drugs by injection.  You are sexually active with a partner who has HIV. Talk with your health care provider about  whether you are at high risk of being infected with HIV. If you choose to begin PrEP, you should first be tested for HIV. You should then be tested every 3 months for as long as you are taking PrEP.  PREGNANCY   If you are premenopausal and you may become pregnant, ask your health care provider about preconception counseling.  If you may become pregnant, take 400 to 800 micrograms (mcg) of folic acid every day.  If you want to prevent pregnancy, talk to your health care provider about birth control (contraception). OSTEOPOROSIS AND MENOPAUSE   Osteoporosis is a disease in which the bones lose minerals and strength with aging. This can result in serious bone fractures. Your risk for osteoporosis can be identified using a bone density scan.  If you are 35 years of age or older, or if you are at risk for osteoporosis and fractures, ask your health care provider if you should be screened.  Ask your health care provider whether you should take a calcium or vitamin D supplement to lower your risk for osteoporosis.  Menopause may have certain physical symptoms and risks.  Hormone replacement therapy may reduce some of these symptoms and risks. Talk to your health care provider about whether hormone replacement therapy is right for you.  HOME CARE INSTRUCTIONS   Schedule regular health, dental, and eye exams.  Stay current with your immunizations.   Do not use any tobacco products including cigarettes, chewing tobacco, or electronic cigarettes.  If you are pregnant, do not drink alcohol.  If you are  breastfeeding, limit how much and how often you drink alcohol.  Limit alcohol intake to no more than 1 drink per day for nonpregnant women. One drink equals 12 ounces of beer, 5 ounces of wine, or 1 ounces of hard liquor.  Do not use street drugs.  Do not share needles.  Ask your health care provider for help if you need support or information about quitting drugs.  Tell your health care provider if you often feel depressed.  Tell your health care provider if you have ever been abused or do not feel safe at home.   This information is not intended to replace advice given to you by your health care provider. Make sure you discuss any questions you have with your health care provider.   Document Released: 04/10/2011 Document Revised: 10/16/2014 Document Reviewed: 08/27/2013 Elsevier Interactive Patient Education [employer].

## 2016-07-18 NOTE — Progress Notes (Signed)
   Subjective:    Patient ID: Sonya Brady, female    DOB: 1950/11/12, 65 y.o.   MRN: [id number]  HPI Here for medicare wellness and CPE, no new complaints. Please see A/P for status and treatment of chronic medical problems.   Diet: heart healthy Physical activity: active Depression/mood screen: negative Hearing: intact to whispered voice Visual acuity: grossly normal, performs annual eye exam  ADLs: capable Fall risk: low Home safety: good Cognitive evaluation: intact to orientation, naming, recall and repetition EOL planning: adv directives discussed, in place  I have personally reviewed and have noted 1. The patient's medical and social history - reviewed today no changes 2. Their use of alcohol, tobacco or illicit drugs 3. Their current medications and supplements 4. The patient's functional ability including ADL's, fall risks, home safety risks and hearing or visual impairment. 5. Diet and physical activities 6. Evidence for depression or mood disorders 7. Care team reviewed and updated (available in snapshot)  Review of Systems  Constitutional: Negative.   HENT: Negative.   Eyes: Negative.   Respiratory: Negative.   Cardiovascular: Negative.   Gastrointestinal: Negative.   Musculoskeletal: Negative.   Skin: Negative.   Neurological: Negative.   Psychiatric/Behavioral: Negative.       Objective:   Physical Exam  Constitutional: She is oriented to person, place, and time. She appears well-developed and well-nourished.  HENT:  Head: Normocephalic and atraumatic.  Eyes: EOM are normal.  Neck: Normal range of motion.  Cardiovascular: Normal rate and regular rhythm.   Pulmonary/Chest: Effort normal and breath sounds normal. No respiratory distress. She has no wheezes. She has no rales.  Abdominal: Soft. Bowel sounds are normal. She exhibits no distension. There is no tenderness. There is no rebound.  Musculoskeletal: She exhibits no edema.  Neurological: She is  alert and oriented to person, place, and time. Coordination normal.  Skin: Skin is warm and dry.  Psychiatric: She has a normal mood and affect.   Vitals:   07/18/16 1043 07/18/16 1124  BP: (!) 150/82 132/72  Pulse: 79   Resp: 12   Temp: 98.4 F (36.9 C)   TempSrc: Oral   SpO2: 98%   Weight: 188 lb (85.3 kg)   Height: 5\' 6"  (1.676 m)       Assessment & Plan:  Pneumonia 13 given at visit.

## 2016-07-18 NOTE — Progress Notes (Signed)
Pre visit review using our clinic review tool, if applicable. No additional management support is needed unless otherwise documented below in the visit note. 

## 2016-07-19 ENCOUNTER — Encounter: Payer: Self-pay | Admitting: Internal Medicine

## 2016-07-20 ENCOUNTER — Telehealth: Payer: Self-pay | Admitting: Internal Medicine

## 2016-07-20 NOTE — Assessment & Plan Note (Signed)
Colonoscopy due later this year and she is reminded, given pneumonia 13 to complete the series. Tetanus up to date. Declines flu shot today. Checking labs. Counseled on the need for better diet and exercise to help with weight. Given 10 year screening recommendations.

## 2016-07-20 NOTE — Assessment & Plan Note (Signed)
Checking lipid panel and adjust as needed. Taking simvastatin 40 mg daily without side effects.

## 2016-07-20 NOTE — Assessment & Plan Note (Signed)
Checking TSH and adjust as needed. Taking synthroid 125 mcg daily.

## 2016-07-20 NOTE — Assessment & Plan Note (Signed)
BP at goal on lisinopril/hctz. Checking CMP and adjust as needed. No side effects from medication.

## 2016-07-20 NOTE — Telephone Encounter (Signed)
[facility]  Patient Name: Sonya Brady  DOB: 1951/01/11    Initial Comment Caller states has redness in arm after pneumonia shot, supposed to have flu shot tomorrow, should she still get it   Nurse Assessment  Nurse: Wayne Sever, RN, Tillie Rung Date/Time (Eastern Time): 07/20/2016 11:31:14 AM  Confirm and document reason for call. If symptomatic, describe symptoms. You must click the next button to save text entered. ---Caller states she had her pneumonia shot on Tuesday. She felt bad yesterday, but feels better today. She states her arm is red where the shot was given and she has a slight rash at the point of the shot  Has the patient traveled out of the country within the last 30 days? ---Not Applicable  Does the patient have any new or worsening symptoms? ---Yes  Will a triage be completed? ---Yes  Related visit to physician within the last 2 weeks? ---N/A  Does the PT have any chronic conditions? (i.e. diabetes, asthma, etc.) ---Yes  List chronic conditions. ---HTN, Cholesterol Medication, Thyroid Issues  Is this a behavioral health or substance abuse call? ---No     Guidelines    Guideline Title Affirmed Question Affirmed Notes  Immunization Reactions Pneumococcal vaccine reactions (all triage questions negative)    Final Disposition User   [place], RN, Tillie Rung    Disagree/Comply: Comply

## 2016-08-28 DIAGNOSIS — H7312 Chronic myringitis, left ear: Secondary | ICD-10-CM | POA: Diagnosis not present

## 2016-08-28 DIAGNOSIS — H6982 Other specified disorders of Eustachian tube, left ear: Secondary | ICD-10-CM | POA: Diagnosis not present

## 2016-09-08 DIAGNOSIS — 419620001 Death: Secondary | SNOMED CT | POA: Diagnosis not present

## 2016-09-08 DEATH — deceased

## 2016-10-26 ENCOUNTER — Ambulatory Visit: Payer: Medicare HMO | Admitting: Internal Medicine

## 2016-10-30 DIAGNOSIS — Z961 Presence of intraocular lens: Secondary | ICD-10-CM | POA: Diagnosis not present

## 2016-10-30 DIAGNOSIS — H43813 Vitreous degeneration, bilateral: Secondary | ICD-10-CM | POA: Diagnosis not present

## 2016-10-30 DIAGNOSIS — H52203 Unspecified astigmatism, bilateral: Secondary | ICD-10-CM | POA: Diagnosis not present

## 2016-10-30 DIAGNOSIS — H40013 Open angle with borderline findings, low risk, bilateral: Secondary | ICD-10-CM | POA: Diagnosis not present

## 2016-11-06 ENCOUNTER — Encounter: Payer: Self-pay | Admitting: Internal Medicine

## 2016-11-06 ENCOUNTER — Ambulatory Visit (INDEPENDENT_AMBULATORY_CARE_PROVIDER_SITE_OTHER): Payer: Medicare HMO | Admitting: Internal Medicine

## 2016-11-06 VITALS — BP 124/78 | HR 72 | Ht 66.0 in | Wt 186.0 lb

## 2016-11-06 DIAGNOSIS — K5732 Diverticulitis of large intestine without perforation or abscess without bleeding: Secondary | ICD-10-CM

## 2016-11-06 DIAGNOSIS — K219 Gastro-esophageal reflux disease without esophagitis: Secondary | ICD-10-CM

## 2016-11-06 DIAGNOSIS — Z1211 Encounter for screening for malignant neoplasm of colon: Secondary | ICD-10-CM | POA: Diagnosis not present

## 2016-11-06 DIAGNOSIS — R109 Unspecified abdominal pain: Secondary | ICD-10-CM

## 2016-11-06 DIAGNOSIS — R1310 Dysphagia, unspecified: Secondary | ICD-10-CM | POA: Diagnosis not present

## 2016-11-06 MED ORDER — MONTELUKAST SODIUM 10 MG PO TABS: 10.0000 mg | ORAL_TABLET | Freq: Every day | ORAL | 6 refills | Status: DC

## 2016-11-06 MED ORDER — OMEPRAZOLE 20 MG PO CPDR: 20.0000 mg | DELAYED_RELEASE_CAPSULE | Freq: Every day | ORAL | 6 refills | Status: DC

## 2016-11-06 NOTE — Progress Notes (Signed)
HISTORY OF PRESENT ILLNESS:  Sonya Brady is a 66 y.o. female with past medical history as listed below who is sent today by her primary care provider regarding diverticulitis, the need for colonoscopy, and issues with GERD. Previous patient Dr. Verl Blalock. She has a history of recurrent diverticulitis requiring antibiotics. Last treated in September. Previous CT documentation 2011. She tells me that she will expressed problems with left lower quadrant pain associated with fever approximately 2 or 3 times per year. Last complete colonoscopy with Dr. Sharlett Iles November 2007. Examination revealed pandiverticulosis. No neoplasia. Currently doing well without issues with constipation or abdominal pain. She does have chronic GERD. She took herself off PPI over concerns of potential side effects. Has not discussed this with any healthcare professional. Since coming off PPIs she has significant problems with GERD and recurrent dysphagia. Last upper endoscopy with Dr. Sharlett Iles September 2014. Maloney dilation 77 [place] at that time. She states she has to chew her food carefully or she will experience discomfort related to dysphagia. GI review of systems is otherwise remarkable for bloating and increased intestinal gas  REVIEW OF SYSTEMS:  All non-GI ROS negative except for night sweats/hot flashes  Past Medical History:  Diagnosis Date  . Acne   . Breast mass, right   . Cholelithiasis   . Depression   . Diverticulosis   . GERD (gastroesophageal reflux disease)   . H. pylori infection   . Hypercholesteremia   . Hypertension   . Hypothyroidism     Past Surgical History:  Procedure Laterality Date  . ABDOMINAL HYSTERECTOMY     both ovary intact  . TYMPANOPLASTY     tube left ear    Social History Sonya Brady  reports that she has never smoked. She has never used smokeless tobacco. She reports that she does not drink alcohol or use drugs.  family history includes Hypertension in her  mother; Other (age of onset: 89) in her father; Other (age of onset: 74) in her mother; Thyroid disease in her brother and sister.  No Known Allergies     PHYSICAL EXAMINATION: Vital signs: BP 124/78   Pulse 72   Ht 5\' 6"  (1.676 m)   Wt 186 lb (84.4 kg)   BMI 30.02 kg/m   Constitutional: generally well-appearing, no acute distress Psychiatric: alert and oriented x3, cooperative Eyes: extraocular movements intact, anicteric, conjunctiva pink Mouth: oral pharynx moist, no lesions Neck: supple Without thyromegaly Lymph: no lymphadenopathy Cardiovascular: heart regular rate and rhythm, no murmur Lungs: clear to auscultation bilaterally Abdomen: soft, nontender, nondistended, no obvious ascites, no peritoneal signs, normal bowel sounds, no organomegaly Rectal:Deferred until colonoscopy Extremities: no clubbing cyanosis or lower extremity edema bilaterally Skin: no lesions on visible extremities Neuro: No focal deficits. Cranial nerves intact  ASSESSMENT:  #1. Recurrent diverticulitis. Last treated in September. Currently asymptomatic #2. GERD. Deteriorated off PPI #3. Recurrent dysphagia. Previously responded to esophageal dilation. Suspect recurrent esophageal stricture or esophageal edema #4. Colon cancer screening. Due for follow-up  PLAN:  #1. High-fiber diet #2. Reflux precautions #3. Reinitiate omeprazole 20 mg daily. Prescribed for the patient today. Multiple refills #4. Discussed current knowledge base regarding chronic PPI use and potential side effects. At this point benefits are felt to outweigh the theoretic risks for this patient #5. Schedule upper endoscopy with esophageal dilation.The nature of the procedure, as well as the risks, benefits, and alternatives were carefully and thoroughly reviewed with the patient. Ample time for discussion and questions allowed. The patient  understood, was satisfied, and agreed to proceed. #6. Schedule screening colonoscopy.The  nature of the procedure, as well as the risks, benefits, and alternatives were carefully and thoroughly reviewed with the patient. Ample time for discussion and questions allowed. The patient understood, was satisfied, and agreed to proceed.  A copy of this consultation note has been sent to Dr. Jenny Reichmann

## 2016-11-06 NOTE — Patient Instructions (Signed)
We will contact you about scheduling your colonoscopy/endoscopy

## 2016-11-16 ENCOUNTER — Encounter: Payer: Self-pay | Admitting: Internal Medicine

## 2016-11-29 ENCOUNTER — Ambulatory Visit (INDEPENDENT_AMBULATORY_CARE_PROVIDER_SITE_OTHER): Payer: Medicare HMO | Admitting: Family Medicine

## 2016-11-29 ENCOUNTER — Encounter: Payer: Self-pay | Admitting: Family Medicine

## 2016-11-29 VITALS — BP 146/88 | HR 76 | Temp 98.1°F | Ht 66.0 in | Wt 186.2 lb

## 2016-11-29 DIAGNOSIS — H8110 Benign paroxysmal vertigo, unspecified ear: Secondary | ICD-10-CM

## 2016-11-29 NOTE — Progress Notes (Signed)
Pre visit review using our clinic review tool, if applicable. No additional management support is needed unless otherwise documented below in the visit note. 

## 2016-11-29 NOTE — Patient Instructions (Signed)
If in the future you develop vertigo lie flat in the bed and do not move until the vertigo stops. People with vertigo will sometimes fall and break a bone.  If the vertigo persists or gets worse I would consult your ENT doctor

## 2016-11-29 NOTE — Progress Notes (Signed)
Sonya Brady is a 66 year old female nonsmoker patient it the [facility] office area she called there and was told him they could see her  She had a tube put in her left ear because of persistent serous otitis media. She's followed by ENT. The tubes been in over a year. Since she had the PE tube she's had recurrent bouts of vertigo. Yesterday she had a spell that lasted about 15 minutes. She stopped it by sitting still and closing her eyes.  She has no nausea vomiting diarrhea etc. No hearing loss.  Review of systems otherwise negative except for some arthritis in her right shoulder. I asked her to take that issue up with Dr. Sharlet Salina when she sees her next  Vs BP (!) 146/88 (BP Location: Left Arm, Patient Position: Sitting, Cuff Size: Normal)   Pulse 76   Temp 98.1 F (36.7 C) (Oral)   Ht 5\' 6"  (1.676 m)   Wt 186 lb 3.2 oz (84.5 kg)   BMI 30.05 kg/m  General she is well-developed well-nourished female no acute distress HEENT were negative except she has a PE tube in her left TM. The orifice of the tube is patent.  #1 BPV......... explained that she does get vertigo to lie flat don't move until the vertigo stops.  #2 chronic serous otitis media with PE tube left TM...Marland KitchenMarland KitchenMarland Kitchen

## 2017-01-16 ENCOUNTER — Encounter: Payer: Self-pay | Admitting: Internal Medicine

## 2017-01-16 ENCOUNTER — Ambulatory Visit: Payer: Medicare HMO | Admitting: Internal Medicine

## 2017-01-16 ENCOUNTER — Ambulatory Visit (AMBULATORY_SURGERY_CENTER): Payer: Self-pay

## 2017-01-16 VITALS — Ht 66.0 in | Wt 180.0 lb

## 2017-01-16 DIAGNOSIS — Z1211 Encounter for screening for malignant neoplasm of colon: Secondary | ICD-10-CM

## 2017-01-16 MED ORDER — NA SULFATE-K SULFATE-MG SULF 17.5-3.13-1.6 GM/177ML PO SOLN: 1.0000 | Freq: Once | ORAL | 0 refills | Status: AC

## 2017-01-16 NOTE — Progress Notes (Signed)
Denies allergies to eggs or soy products. Denies complication of anesthesia or sedation. Denies use of weight loss medication. Denies use of O2.   Emmi instructions given for colonoscopy.  

## 2017-01-23 ENCOUNTER — Encounter: Payer: Self-pay | Admitting: Internal Medicine

## 2017-01-23 ENCOUNTER — Ambulatory Visit (AMBULATORY_SURGERY_CENTER): Payer: Medicare HMO | Admitting: Internal Medicine

## 2017-01-23 VITALS — BP 115/72 | HR 67 | Temp 98.0°F | Resp 12 | Ht 66.0 in | Wt 180.0 lb

## 2017-01-23 DIAGNOSIS — K222 Esophageal obstruction: Secondary | ICD-10-CM | POA: Diagnosis not present

## 2017-01-23 DIAGNOSIS — K635 Polyp of colon: Secondary | ICD-10-CM

## 2017-01-23 DIAGNOSIS — K219 Gastro-esophageal reflux disease without esophagitis: Secondary | ICD-10-CM

## 2017-01-23 DIAGNOSIS — R1310 Dysphagia, unspecified: Secondary | ICD-10-CM

## 2017-01-23 DIAGNOSIS — D123 Benign neoplasm of transverse colon: Secondary | ICD-10-CM

## 2017-01-23 DIAGNOSIS — Z1212 Encounter for screening for malignant neoplasm of rectum: Secondary | ICD-10-CM | POA: Diagnosis not present

## 2017-01-23 DIAGNOSIS — D125 Benign neoplasm of sigmoid colon: Secondary | ICD-10-CM | POA: Diagnosis not present

## 2017-01-23 DIAGNOSIS — Z1211 Encounter for screening for malignant neoplasm of colon: Secondary | ICD-10-CM | POA: Diagnosis not present

## 2017-01-23 MED ORDER — SODIUM CHLORIDE 0.9 % IV SOLN: 500.0000 mL | INTRAVENOUS | Status: DC

## 2017-01-23 NOTE — Op Note (Signed)
[place] Patient Name: Sonya Brady Procedure Date: 01/23/2017 8:09 AM MRN: [id number] Endoscopist: Docia Chuck. Henrene Pastor , MD Age: 66 Referring MD:  Date of Birth: 02/09/1951 Gender: Female Account #: [id number] Procedure:                Upper GI endoscopy, with [facility] dilation esophagus                            - 64F Indications:              Dysphagia. History of esophageal stricture with                            previous dilation (Dr. Sharlett Iles) Medicines:                Monitored Anesthesia Care Procedure:                Pre-Anesthesia Assessment:                           - Prior to the procedure, a History and Physical                            was performed, and patient medications and                            allergies were reviewed. The patient's tolerance of                            previous anesthesia was also reviewed. The risks                            and benefits of the procedure and the sedation                            options and risks were discussed with the patient.                            All questions were answered, and informed consent                            was obtained. Prior Anticoagulants: The patient has                            taken no previous anticoagulant or antiplatelet                            agents. ASA Grade Assessment: II - A patient with                            mild systemic disease. After reviewing the risks                            and benefits, the patient was deemed in  satisfactory condition to undergo the procedure.                           After obtaining informed consent, the endoscope was                            passed under direct vision. Throughout the                            procedure, the patient's blood pressure, pulse, and                            oxygen saturations were monitored continuously. The                            Endoscope was introduced through the  mouth, and                            advanced to the second part of duodenum. The upper                            GI endoscopy was accomplished without difficulty.                            The patient tolerated the procedure well. Scope In: Scope Out: Findings:                 One mild benign-appearing, intrinsic stenosis was                            found 32 cm from the incisors. This measured 1.5 cm                            (inner diameter). The scope was withdrawn. Dilation                            was performed with a Maloney dilator with no                            resistance at 44 Fr.                           The exam of the esophagus was otherwise normal.                           The stomach was normal.                           The examined duodenum was normal.                           The cardia and gastric fundus were normal on                            retroflexion. Complications:  No immediate complications. Estimated Blood Loss:     Estimated blood loss: none. Impression:               - Benign-appearing esophageal stenosis. Dilated.                           - Normal stomach.                           - Normal examined duodenum.                           - No specimens collected. Recommendation:           - Patient has a contact number available for                            emergencies. The signs and symptoms of potential                            delayed complications were discussed with the                            patient. Return to normal activities tomorrow.                            Written discharge instructions were provided to the                            patient.                           - Post dilation diet.                           - Continue present medications.                           - Routine office follow-up with Dr. Henrene Pastor in 1                            year. Contact the office in the interim for any                             questions or problems John N. Henrene Pastor, MD 01/23/2017 8:42:52 AM This report has been signed electronically.

## 2017-01-23 NOTE — Progress Notes (Signed)
Spontaneous respirations throughout. VSS. Resting comfortably. To PACU on room air. Report to  Michelle RN. 

## 2017-01-23 NOTE — Progress Notes (Signed)
Called to room to assist during endoscopic procedure.  Patient ID and intended procedure confirmed with present staff. Received instructions for my participation in the procedure from the performing physician.  

## 2017-01-23 NOTE — Patient Instructions (Signed)
YOU HAD AN ENDOSCOPIC PROCEDURE TODAY AT [facility]:   Refer to the procedure report that was given to you for any specific questions about what was found during the examination.  If the procedure report does not answer your questions, please call your gastroenterologist to clarify.  If you requested that your care partner not be given the details of your procedure findings, then the procedure report has been included in a sealed envelope for you to review at your convenience later.  YOU SHOULD EXPECT: Some feelings of bloating in the abdomen. Passage of more gas than usual.  Walking can help get rid of the air that was put into your GI tract during the procedure and reduce the bloating. If you had a lower endoscopy (such as a colonoscopy or flexible sigmoidoscopy) you may notice spotting of blood in your stool or on the toilet paper. If you underwent a bowel prep for your procedure, you may not have a normal bowel movement for a few days.  Please Note:  You might notice some irritation and congestion in your nose or some drainage.  This is from the oxygen used during your procedure.  There is no need for concern and it should clear up in a day or so.  SYMPTOMS TO REPORT IMMEDIATELY:   Following lower endoscopy (colonoscopy or flexible sigmoidoscopy):  Excessive amounts of blood in the stool  Significant tenderness or worsening of abdominal pains  Swelling of the abdomen that is new, acute  Fever of 100F or higher   Following upper endoscopy (EGD)  Vomiting of blood or coffee ground material  New chest pain or pain under the shoulder blades  Painful or persistently difficult swallowing  New shortness of breath  Fever of 100F or higher  Black, tarry-looking stools  For urgent or emergent issues, a gastroenterologist can be reached at any hour by calling [phone].   DIET:  Follow Dilation Diet as ordered.  Drink plenty of fluids but you should avoid alcoholic  beverages for 24 hours.  ACTIVITY:  You should plan to take it easy for the rest of today and you should NOT DRIVE or use heavy machinery until tomorrow (because of the sedation medicines used during the test).    FOLLOW UP: Our staff will call the number listed on your records the next business day following your procedure to check on you and address any questions or concerns that you may have regarding the information given to you following your procedure. If we do not reach you, we will leave a message.  However, if you are feeling well and you are not experiencing any problems, there is no need to return our call.  We will assume that you have returned to your regular daily activities without incident.  If any biopsies were taken you will be contacted by phone or by letter within the next 1-3 weeks.  Please call us at [phone] if you have not heard about the biopsies in 3 weeks.  Polyps (handout given) Diverticulosis (handout given) Await for biopsy results to determined next repeat Colonoscopy screening Follow Dilation Diet  Post esophageal Dilation Diet (handout ordered)   SIGNATURES/CONFIDENTIALITY: You and/or your care partner have signed paperwork which will be entered into your electronic medical record.  These signatures attest to the fact that that the information above on your After Visit Summary has been reviewed and is understood.  Full responsibility of the confidentiality of this discharge information lies with you and/or  your care-partner. 

## 2017-01-23 NOTE — Op Note (Signed)
[place] Patient Name: Sonya Brady Procedure Date: 01/23/2017 8:09 AM MRN: [id number] Endoscopist: Docia Chuck. Henrene Pastor , MD Age: 66 Referring MD:  Date of Birth: [birth year] Gender: Female Account #: [id number] Procedure:                Colonoscopy, with cold snare polypectomy X2 Indications:              Screening for colorectal malignant neoplasm.                            Negative index examination 2007 (Dr. Sharlett Iles) Medicines:                Monitored Anesthesia Care Procedure:                Pre-Anesthesia Assessment:                           - Prior to the procedure, a History and Physical                            was performed, and patient medications and                            allergies were reviewed. The patient's tolerance of                            previous anesthesia was also reviewed. The risks                            and benefits of the procedure and the sedation                            options and risks were discussed with the patient.                            All questions were answered, and informed consent                            was obtained. Prior Anticoagulants: The patient has                            taken no previous anticoagulant or antiplatelet                            agents. ASA Grade Assessment: II - A patient with                            mild systemic disease. After reviewing the risks                            and benefits, the patient was deemed in                            satisfactory condition to undergo the procedure.  After obtaining informed consent, the colonoscope                            was passed under direct vision. Throughout the                            procedure, the patient's blood pressure, pulse, and                            oxygen saturations were monitored continuously. The                            Colonoscope was introduced through the anus and            advanced to the the cecum, identified by                            appendiceal orifice and ileocecal valve. The                            ileocecal valve, appendiceal orifice, and rectum                            were photographed. The quality of the bowel                            preparation was excellent. The colonoscopy was                            performed without difficulty. The patient tolerated                            the procedure well. The bowel preparation used was                            SUPREP. Scope In: 8:16:22 AM Scope Out: 8:31:21 AM Scope Withdrawal Time: 0 hours 12 minutes 1 second  Total Procedure Duration: 0 hours 14 minutes 59 seconds  Findings:                 Two polyps were found in the sigmoid colon and                            proximal transverse colon. The polyps were 1 to 2                            mm in size. These polyps were removed with a cold                            snare. Resection and retrieval were complete.                           Multiple small and large-mouthed diverticula were  found in the left colon and right colon.                           The exam was otherwise without abnormality on                            direct and retroflexion views. Complications:            No immediate complications. Estimated blood loss:                            None. Estimated Blood Loss:     Estimated blood loss: none. Impression:               - Two 1 to 2 mm polyps in the sigmoid colon and in                            the proximal transverse colon, removed with a cold                            snare. Resected and retrieved.                           - Diverticulosis in the left colon and in the right                            colon.                           - The examination was otherwise normal on direct                            and retroflexion views. Recommendation:           - Repeat  colonoscopy in 5-10 years for surveillance.                           - Patient has a contact number available for                            emergencies. The signs and symptoms of potential                            delayed complications were discussed with the                            patient. Return to normal activities tomorrow.                            Written discharge instructions were provided to the                            patient.                           - Resume previous diet.                           -  Continue present medications.                           - Await pathology results. Docia Chuck. Henrene Pastor, MD 01/23/2017 8:35:58 AM This report has been signed electronically.

## 2017-01-23 NOTE — Progress Notes (Signed)
Pt's states no medical or surgical changes since previsit or office visit. 

## 2017-01-24 ENCOUNTER — Telehealth: Payer: Self-pay

## 2017-01-24 NOTE — Telephone Encounter (Signed)
  Follow up Call-  Call back number 01/23/2017  Post procedure Call Back phone  # [phone]  Permission to leave phone message Yes  Some recent data might be hidden       Patient questions:  Do you have a fever, pain , or abdominal swelling? No. Pain Score  0 *  Have you tolerated food without any problems? Yes.    Have you been able to return to your normal activities? Yes.    Do you have any questions about your discharge instructions: Diet   No. Medications  No. Follow up visit  No.  Do you have questions or concerns about your Care? No.  Actions: * If pain score is 4 or above: No action needed, pain <4.

## 2017-01-24 NOTE — Telephone Encounter (Signed)
Unable to reach line is busy.

## 2017-01-25 DIAGNOSIS — H6982 Other specified disorders of Eustachian tube, left ear: Secondary | ICD-10-CM | POA: Diagnosis not present

## 2017-01-25 DIAGNOSIS — H7312 Chronic myringitis, left ear: Secondary | ICD-10-CM | POA: Diagnosis not present

## 2017-01-25 DIAGNOSIS — J029 Acute pharyngitis, unspecified: Secondary | ICD-10-CM | POA: Diagnosis not present

## 2017-01-26 ENCOUNTER — Encounter: Payer: Self-pay | Admitting: Internal Medicine

## 2017-02-16 DIAGNOSIS — Z01419 Encounter for gynecological examination (general) (routine) without abnormal findings: Secondary | ICD-10-CM | POA: Diagnosis not present

## 2017-02-16 DIAGNOSIS — Z1231 Encounter for screening mammogram for malignant neoplasm of breast: Secondary | ICD-10-CM | POA: Diagnosis not present

## 2017-02-16 DIAGNOSIS — Z1389 Encounter for screening for other disorder: Secondary | ICD-10-CM | POA: Diagnosis not present

## 2017-02-16 DIAGNOSIS — Z130 Encounter for screening for diseases of the blood and blood-forming organs and certain disorders involving the immune mechanism: Secondary | ICD-10-CM | POA: Diagnosis not present

## 2017-02-27 ENCOUNTER — Other Ambulatory Visit: Payer: Self-pay | Admitting: Internal Medicine

## 2017-03-01 ENCOUNTER — Encounter: Payer: Self-pay | Admitting: Gastroenterology

## 2017-03-01 ENCOUNTER — Ambulatory Visit (INDEPENDENT_AMBULATORY_CARE_PROVIDER_SITE_OTHER): Payer: Medicare HMO | Admitting: Gastroenterology

## 2017-03-01 ENCOUNTER — Telehealth: Payer: Self-pay | Admitting: Internal Medicine

## 2017-03-01 VITALS — BP 124/76 | HR 80 | Ht 66.0 in | Wt 179.8 lb

## 2017-03-01 DIAGNOSIS — R1032 Left lower quadrant pain: Secondary | ICD-10-CM | POA: Diagnosis not present

## 2017-03-01 DIAGNOSIS — K5792 Diverticulitis of intestine, part unspecified, without perforation or abscess without bleeding: Secondary | ICD-10-CM | POA: Diagnosis not present

## 2017-03-01 MED ORDER — LEVOFLOXACIN 250 MG PO TABS: 250.0000 mg | ORAL_TABLET | Freq: Two times a day (BID) | ORAL | 0 refills | Status: DC

## 2017-03-01 MED ORDER — METRONIDAZOLE 250 MG PO TABS: 250.0000 mg | ORAL_TABLET | Freq: Three times a day (TID) | ORAL | 0 refills | Status: DC

## 2017-03-01 NOTE — Progress Notes (Signed)
03/01/2017 Sonya Brady [id number] 05-Mar-1951   HISTORY OF PRESENT ILLNESS:  This is a 66 year old female who is known to Dr. Henrene Pastor. Just had colonoscopy last month at which time she was found to have 2 polyps that were removed and were hyperplastic polyps on pathology. Also had diverticulosis in the left and right colon. Has been treated for diverticulitis on and off throughout the years, but only have one radiographic imaging study from 2011 to actually confirm this diagnosis. Last treatment was in 06/2016 at which time her PCP treated with Levaquin 500 mg daily and flagyl 500 mg TID because she complained of side effects of Cipro. Today she also complains of side effects of Flagyl and is asking for decreased doses of both antibiotics.  She is here today with complaints of left-sided and suprapubic abdominal pain that began yesterday. She says that overall yesterday and today she is just not felt great. She says that the discomfort bothered her all night. She says that otherwise she is moving her bowels just fine. Also feels very bloated.  No fevers, chills, nausea, or vomiting.   Past Medical History:  Diagnosis Date  . Acne   . Allergy   . Breast mass, right   . Cataract   . Cholelithiasis   . Depression    situational when husband was sick and dying.  . Diverticulosis   . GERD (gastroesophageal reflux disease)   . H. pylori infection   . Hypercholesteremia   . Hypertension   . Hypothyroidism    Past Surgical History:  Procedure Laterality Date  . ABDOMINAL HYSTERECTOMY     both ovary intact  . BREAST LUMPECTOMY Bilateral   . carpel tunnel surgery Right   . TYMPANOPLASTY     tube left ear    reports that she has never smoked. She has never used smokeless tobacco. She reports that she does not drink alcohol or use drugs. family history includes Hypertension in her mother; Other (age of onset: 77) in her father; Other (age of onset: 47) in her mother; Thyroid disease in  her brother and sister. No Known Allergies    Outpatient Encounter Prescriptions as of 03/01/2017  Medication Sig  . Calcium Carbonate-Vitamin D (CALCIUM + D PO) Take by mouth.  . clindamycin (CLEOCIN T) 1 % external solution Apply 3-4 drops twice weekly to affected ear until return to clinic inf 4 months  . fluticasone (FLONASE) 50 MCG/ACT nasal spray Place 1 spray into both nostrils 2 (two) times daily.  Marland Kitchen latanoprost (XALATAN) 0.005 % ophthalmic solution Place 1 drop into both eyes at bedtime.  Marland Kitchen levothyroxine (SYNTHROID, LEVOTHROID) 125 MCG tablet Take 1 tablet (125 mcg total) by mouth daily.  Marland Kitchen lisinopril-hydrochlorothiazide (PRINZIDE,ZESTORETIC) 20-12.5 MG tablet TAKE ONE TABLET BY MOUTH ONCE DAILY  . Multiple Vitamins-Minerals (MULTIVITAMIN PO) Take by mouth.  Marland Kitchen omeprazole (PRILOSEC) 20 MG capsule Take 1 capsule (20 mg total) by mouth daily.  . simvastatin (ZOCOR) 40 MG tablet TAKE ONE TABLET BY MOUTH ONCE DAILY  . timolol (BETIMOL) 0.5 % ophthalmic solution 1 drop 2 (two) times daily.  Marland Kitchen levofloxacin (LEVAQUIN) 250 MG tablet Take 1 tablet (250 mg total) by mouth 2 (two) times daily.  . metroNIDAZOLE (FLAGYL) 250 MG tablet Take 1 tablet (250 mg total) by mouth 3 (three) times daily.   Facility-Administered Encounter Medications as of 03/01/2017  Medication  . 0.9 %  sodium chloride infusion     REVIEW OF SYSTEMS  : All other  systems reviewed and negative except where noted in the History of Present Illness.   PHYSICAL EXAM: BP 124/76   Pulse 80   Ht 5\' 6"  (1.676 m)   Wt 179 lb 12.8 oz (81.6 kg)   BMI 29.02 kg/m  General: Well developed female in no acute distress Head: Normocephalic and atraumatic Eyes:  Sclerae anicteric, conjunctiva pink. Ears: Normal auditory acuity Lungs: Clear throughout to auscultation Heart: Regular rate and rhythm Abdomen: Soft, non-distended. Normal bowel sounds.  Moderate LLQ and suprapubic TTP. Musculoskeletal: Symmetrical with no gross  deformities  Skin: No lesions on visible extremities Extremities: No edema  Neurological: Alert oriented x 4, grossly non-focal Psychological:  Alert and cooperative. Normal mood and affect  ASSESSMENT AND PLAN: -LLQ abdominal pain:  Suspect diverticulitis.  Has been treated for this on and off throughout the years, but only have one radiographic imaging study from 2011 to actually confirm this diagnosis. I think that may be what she has going on now as well. I'm going to treat her with Levaquin and Flagyl. Last time her PCP treated with Levaquin because she complained of side effects of Cipro. Today she also complains of side effects of Flagyl and is asking for decreased doses of both antibiotics. I gave her Cipro 250 mg twice a day for 10 days and Flagyl 250 mg 3 times a day for 10 days. I suggested a CT scan to rule out other issues and also to confirm diverticulitis since she is having recurrent symptoms she may require surgery at some point. Patient declined CT scan. She will call back for ongoing symptoms at which time CT scan should be ordered.   CC:  Hoyt Koch, *

## 2017-03-01 NOTE — Patient Instructions (Signed)
We have sent the following medications to your pharmacy for you to pick up at your convenience: Levaquin, Flagyl  Call back if symptoms persist after treatment.

## 2017-03-01 NOTE — Progress Notes (Signed)
Initial assessment and plans reviewed 

## 2017-03-01 NOTE — Telephone Encounter (Signed)
Pt states she thinks she is having a diverticulitis flare. Pt c/o being bloated and having pain that feels like "contractions" in the lower part in the middle of her abdomen. Pt had ECL done in April. Pt scheduled to see Alonza Bogus PA today at 3pm. Pt aware of appt.

## 2017-03-02 ENCOUNTER — Telehealth: Payer: Self-pay

## 2017-03-02 NOTE — Telephone Encounter (Signed)
Patient is on the list for [employer] 2018 and may be a good candidate for an AWV. Please let me know if/when appt is scheduled.   

## 2017-03-02 NOTE — Telephone Encounter (Signed)
Called patient to schedule awv. Lvm for patient to call office to schedule appt.  °

## 2017-03-07 DIAGNOSIS — R69 Illness, unspecified: Secondary | ICD-10-CM | POA: Diagnosis not present

## 2017-05-27 ENCOUNTER — Other Ambulatory Visit: Payer: Self-pay | Admitting: Internal Medicine

## 2017-06-04 DIAGNOSIS — H7312 Chronic myringitis, left ear: Secondary | ICD-10-CM | POA: Diagnosis not present

## 2017-06-12 ENCOUNTER — Other Ambulatory Visit: Payer: Self-pay

## 2017-06-12 MED ORDER — OMEPRAZOLE 20 MG PO CPDR: 20.0000 mg | DELAYED_RELEASE_CAPSULE | Freq: Every day | ORAL | 6 refills | Status: DC

## 2017-07-06 ENCOUNTER — Other Ambulatory Visit: Payer: Self-pay | Admitting: Internal Medicine

## 2017-07-17 DIAGNOSIS — R69 Illness, unspecified: Secondary | ICD-10-CM | POA: Diagnosis not present

## 2017-07-30 DIAGNOSIS — H7312 Chronic myringitis, left ear: Secondary | ICD-10-CM | POA: Diagnosis not present

## 2017-07-30 DIAGNOSIS — H6522 Chronic serous otitis media, left ear: Secondary | ICD-10-CM | POA: Diagnosis not present

## 2017-08-02 ENCOUNTER — Ambulatory Visit (INDEPENDENT_AMBULATORY_CARE_PROVIDER_SITE_OTHER): Payer: Medicare HMO | Admitting: General Practice

## 2017-08-02 DIAGNOSIS — Z23 Encounter for immunization: Secondary | ICD-10-CM

## 2017-09-03 ENCOUNTER — Other Ambulatory Visit: Payer: Self-pay

## 2017-09-03 MED ORDER — OMEPRAZOLE 20 MG PO CPDR: 20.0000 mg | DELAYED_RELEASE_CAPSULE | Freq: Every day | ORAL | 6 refills | Status: DC

## 2017-09-10 ENCOUNTER — Other Ambulatory Visit: Payer: Self-pay | Admitting: Internal Medicine

## 2017-09-11 MED ORDER — SIMVASTATIN 40 MG PO TABS: 40.0000 mg | ORAL_TABLET | Freq: Every day | ORAL | 0 refills | Status: DC

## 2017-09-11 NOTE — Addendum Note (Signed)
Addended by: Raford Pitcher R on: 09/11/2017 09:22 AM   Modules accepted: Orders

## 2017-10-19 ENCOUNTER — Encounter: Payer: Self-pay | Admitting: Internal Medicine

## 2017-10-19 ENCOUNTER — Ambulatory Visit (INDEPENDENT_AMBULATORY_CARE_PROVIDER_SITE_OTHER): Payer: Medicare HMO | Admitting: Internal Medicine

## 2017-10-19 ENCOUNTER — Other Ambulatory Visit (INDEPENDENT_AMBULATORY_CARE_PROVIDER_SITE_OTHER): Payer: Medicare HMO

## 2017-10-19 VITALS — BP 130/82 | HR 79 | Temp 97.9°F | Ht 66.0 in | Wt 183.0 lb

## 2017-10-19 DIAGNOSIS — E039 Hypothyroidism, unspecified: Secondary | ICD-10-CM | POA: Diagnosis not present

## 2017-10-19 DIAGNOSIS — E785 Hyperlipidemia, unspecified: Secondary | ICD-10-CM | POA: Diagnosis not present

## 2017-10-19 DIAGNOSIS — K219 Gastro-esophageal reflux disease without esophagitis: Secondary | ICD-10-CM | POA: Diagnosis not present

## 2017-10-19 DIAGNOSIS — E119 Type 2 diabetes mellitus without complications: Secondary | ICD-10-CM | POA: Diagnosis not present

## 2017-10-19 DIAGNOSIS — Z0000 Encounter for general adult medical examination without abnormal findings: Secondary | ICD-10-CM

## 2017-10-19 DIAGNOSIS — E1169 Type 2 diabetes mellitus with other specified complication: Secondary | ICD-10-CM

## 2017-10-19 DIAGNOSIS — I10 Essential (primary) hypertension: Secondary | ICD-10-CM

## 2017-10-19 LAB — COMPREHENSIVE METABOLIC PANEL
ALK PHOS: 78 U/L (ref 39–117)
ALT: 19 U/L (ref 0–35)
AST: 15 U/L (ref 0–37)
Albumin: 4.7 g/dL (ref 3.5–5.2)
BILIRUBIN TOTAL: 0.8 mg/dL (ref 0.2–1.2)
BUN: 11 mg/dL (ref 6–23)
CO2: 27 mEq/L (ref 19–32)
Calcium: 9.7 mg/dL (ref 8.4–10.5)
Chloride: 98 mEq/L (ref 96–112)
Creatinine, Ser: 0.68 mg/dL (ref 0.40–1.20)
GFR: 91.45 mL/min (ref >60.00)
GLUCOSE: 255 mg/dL — AB (ref 70–99)
Potassium: 3.7 mEq/L (ref 3.5–5.1)
SODIUM: 135 meq/L (ref 135–145)
TOTAL PROTEIN: 7.8 g/dL (ref 6.0–8.3)

## 2017-10-19 LAB — CBC
HCT: 43 % (ref 36.0–46.0)
Hemoglobin: 14.5 g/dL (ref 12.0–15.0)
MCHC: 33.8 g/dL (ref 30.0–36.0)
MCV: 88.1 fl (ref 78.0–100.0)
Platelets: 249 10*3/uL (ref 150.0–400.0)
RBC: 4.88 Mil/uL (ref 3.87–5.11)
RDW: 13 % (ref 11.5–15.5)
WBC: 5.2 10*3/uL (ref 4.0–10.5)

## 2017-10-19 LAB — LIPID PANEL
Cholesterol: 188 mg/dL (ref 0–200)
HDL: 43.6 mg/dL (ref >39.00)
LDL Cholesterol: 113 mg/dL — ABNORMAL HIGH (ref 0–99)
NONHDL: 144.41
Total CHOL/HDL Ratio: 4
Triglycerides: 157 mg/dL — ABNORMAL HIGH (ref 0.0–149.0)
VLDL: 31.4 mg/dL (ref 0.0–40.0)

## 2017-10-19 LAB — TSH: TSH: 3.95 u[IU]/mL (ref 0.35–4.50)

## 2017-10-19 LAB — HEMOGLOBIN A1C: HEMOGLOBIN A1C: 8.6 % — AB (ref 4.6–6.5)

## 2017-10-19 MED ORDER — ZOLPIDEM TARTRATE 5 MG PO TABS: 5.0000 mg | ORAL_TABLET | Freq: Every evening | ORAL | 1 refills | Status: DC | PRN

## 2017-10-19 NOTE — Assessment & Plan Note (Signed)
Foot exam done, encouraged to get eye exam. Checking HgA1c and lipid panel today.

## 2017-10-19 NOTE — Assessment & Plan Note (Signed)
Pneumonia and flu and tetanus up to date. Counseled about shingrix vaccine. Colonoscopy up to date. Mammogram needed and reminded about this. Counseled about eye exam. Given 10 year screening recommendations.

## 2017-10-19 NOTE — Progress Notes (Signed)
   Subjective:    Patient ID: Sonya Brady, female    DOB: June 07, 1951, 67 y.o.   MRN: [id number]  HPI The patient is a 67 YO female coming in for physical. Has had a very rough year emotionally. Here for medicare wellness, no new complaints. Please see A/P for status and treatment of chronic medical problems.   Diet: DM since diabetic Physical activity: sedentary Depression/mood screen: positive due to family strife Hearing: intact to whispered voice Visual acuity: grossly normal, overdue for annual eye exam  ADLs: capable Fall risk: none Home safety: good Cognitive evaluation: intact to orientation, naming, recall and repetition EOL planning: adv directives discussed  I have personally reviewed and have noted 1. The patient's medical and social history - reviewed today no changes 2. Their use of alcohol, tobacco or illicit drugs 3. Their current medications and supplements 4. The patient's functional ability including ADL's, fall risks, home safety risks and hearing or visual impairment. 5. Diet and physical activities 6. Evidence for depression or mood disorders 7. Care team reviewed and updated (available in snapshot)   PMH, [facility], social history reviewed and updated.   Review of Systems  Constitutional: Positive for activity change and appetite change.  HENT: Negative.   Eyes: Negative.   Respiratory: Negative for cough, chest tightness and shortness of breath.   Cardiovascular: Negative for chest pain, palpitations and leg swelling.  Gastrointestinal: Negative for abdominal distention, abdominal pain, constipation, diarrhea, nausea and vomiting.  Musculoskeletal: Negative.   Skin: Negative.   Neurological: Negative.   Psychiatric/Behavioral: Positive for decreased concentration, dysphoric mood and sleep disturbance. Negative for agitation, hallucinations, self-injury and suicidal ideas.      Objective:   Physical Exam  Constitutional: She is oriented to person, place,  and time. She appears well-developed and well-nourished.  HENT:  Head: Normocephalic and atraumatic.  Eyes: EOM are normal.  Neck: Normal range of motion.  Cardiovascular: Normal rate and regular rhythm.  Pulmonary/Chest: Effort normal and breath sounds normal. No respiratory distress. She has no wheezes. She has no rales.  Abdominal: Soft. Bowel sounds are normal. She exhibits no distension. There is no tenderness. There is no rebound.  Musculoskeletal: She exhibits no edema.  Neurological: She is alert and oriented to person, place, and time. Coordination normal.  Skin: Skin is warm and dry.  Foot exam done  Psychiatric: She has a normal mood and affect.   Vitals:   10/19/17 0838  BP: 130/82  Pulse: 79  Temp: 97.9 F (36.6 C)  TempSrc: Oral  SpO2: 99%  Weight: 183 lb (83 kg)  Height: 5\' 6"  (1.676 m)      Assessment & Plan:

## 2017-10-19 NOTE — Assessment & Plan Note (Signed)
Taking simvastatin 40 mg daily, checking lipid panel and adjust as needed.  

## 2017-10-19 NOTE — Assessment & Plan Note (Signed)
BP at goal on her lisinopril/hctz. Checking CMP and adjust as needed.

## 2017-10-19 NOTE — Assessment & Plan Note (Signed)
Controlled on omeprazole.   

## 2017-10-19 NOTE — Assessment & Plan Note (Signed)
Taking synthroid 125 mcg daily and checking TSH and free T4 and adjust as needed.

## 2017-10-19 NOTE — Patient Instructions (Signed)
We are checking the labs today and think about getting the shingles vaccine called shingrix at the pharmacy.   We have sent in Reader for sleep which you can take at night time as needed.   Health Maintenance, Female Adopting a healthy lifestyle and getting preventive care can go a long way to promote health and wellness. Talk with your health care provider about what schedule of regular examinations is right for you. This is a good chance for you to check in with your provider about disease prevention and staying healthy. In between checkups, there are plenty of things you can do on your own. Experts have done a lot of research about which lifestyle changes and preventive measures are most likely to keep you healthy. Ask your health care provider for more information. Weight and diet Eat a healthy diet  Be sure to include plenty of vegetables, fruits, low-fat dairy products, and lean protein.  Do not eat a lot of foods high in solid fats, added sugars, or salt.  Get regular exercise. This is one of the most important things you can do for your health. ? Most adults should exercise for at least 150 minutes each week. The exercise should increase your heart rate and make you sweat (moderate-intensity exercise). ? Most adults should also do strengthening exercises at least twice a week. This is in addition to the moderate-intensity exercise.  Maintain a healthy weight  Body mass index (BMI) is a measurement that can be used to identify possible weight problems. It estimates body fat based on height and weight. Your health care provider can help determine your BMI and help you achieve or maintain a healthy weight.  For females 28 years of age and older: ? A BMI below 18.5 is considered underweight. ? A BMI of 18.5 to 24.9 is normal. ? A BMI of 25 to 29.9 is considered overweight. ? A BMI of 30 and above is considered obese.  Watch levels of cholesterol and blood lipids  You should start  having your blood tested for lipids and cholesterol at 68 years of age, then have this test every 5 years.  You may need to have your cholesterol levels checked more often if: ? Your lipid or cholesterol levels are high. ? You are older than 68 years of age. ? You are at high risk for heart disease.  Cancer screening Lung Cancer  Lung cancer screening is recommended for adults 52-44 years old who are at high risk for lung cancer because of a history of smoking.  A yearly low-dose CT scan of the lungs is recommended for people who: ? Currently smoke. ? Have quit within the past 15 years. ? Have at least a 30-pack-year history of smoking. A pack year is smoking an average of one pack of cigarettes a day for 1 year.  Yearly screening should continue until it has been 15 years since you quit.  Yearly screening should stop if you develop a health problem that would prevent you from having lung cancer treatment.  Breast Cancer  Practice breast self-awareness. This means understanding how your breasts normally appear and feel.  It also means doing regular breast self-exams. Let your health care provider know about any changes, no matter how small.  If you are in your 20s or 30s, you should have a clinical breast exam (CBE) by a health care provider every 1-3 years as part of a regular health exam.  If you are 33 or older, have a  CBE every year. Also consider having a breast X-ray (mammogram) every year.  If you have a family history of breast cancer, talk to your health care provider about genetic screening.  If you are at high risk for breast cancer, talk to your health care provider about having an MRI and a mammogram every year.  Breast cancer gene (BRCA) assessment is recommended for women who have family members with BRCA-related cancers. BRCA-related cancers include: ? Breast. ? Ovarian. ? Tubal. ? Peritoneal cancers.  Results of the assessment will determine the need for  genetic counseling and BRCA1 and BRCA2 testing.  Cervical Cancer Your health care provider may recommend that you be screened regularly for cancer of the pelvic organs (ovaries, uterus, and vagina). This screening involves a pelvic examination, including checking for microscopic changes to the surface of your cervix (Pap test). You may be encouraged to have this screening done every 3 years, beginning at age 68.  For women ages 91-65, health care providers may recommend pelvic exams and Pap testing every 3 years, or they may recommend the Pap and pelvic exam, combined with testing for human papilloma virus (HPV), every 5 years. Some types of HPV increase your risk of cervical cancer. Testing for HPV may also be done on women of any age with unclear Pap test results.  Other health care providers may not recommend any screening for nonpregnant women who are considered low risk for pelvic cancer and who do not have symptoms. Ask your health care provider if a screening pelvic exam is right for you.  If you have had past treatment for cervical cancer or a condition that could lead to cancer, you need Pap tests and screening for cancer for at least 20 years after your treatment. If Pap tests have been discontinued, your risk factors (such as having a new sexual partner) need to be reassessed to determine if screening should resume. Some women have medical problems that increase the chance of getting cervical cancer. In these cases, your health care provider may recommend more frequent screening and Pap tests.  Colorectal Cancer  This type of cancer can be detected and often prevented.  Routine colorectal cancer screening usually begins at 68 years of age and continues through 68 years of age.  Your health care provider may recommend screening at an earlier age if you have risk factors for colon cancer.  Your health care provider may also recommend using home test kits to check for hidden blood in the  stool.  A small camera at the end of a tube can be used to examine your colon directly (sigmoidoscopy or colonoscopy). This is done to check for the earliest forms of colorectal cancer.  Routine screening usually begins at age 68.  Direct examination of the colon should be repeated every 5-10 years through 68 years of age. However, you may need to be screened more often if early forms of precancerous polyps or small growths are found.  Skin Cancer  Check your skin from head to toe regularly.  Tell your health care provider about any new moles or changes in moles, especially if there is a change in a mole's shape or color.  Also tell your health care provider if you have a mole that is larger than the size of a pencil eraser.  Always use sunscreen. Apply sunscreen liberally and repeatedly throughout the day.  Protect yourself by wearing long sleeves, pants, a wide-brimmed hat, and sunglasses whenever you are outside.  Heart disease,  diabetes, and high blood pressure  High blood pressure causes heart disease and increases the risk of stroke. High blood pressure is more likely to develop in: ? People who have blood pressure in the high end of the normal range (130-139/85-89 mm Hg). ? People who are overweight or obese. ? People who are African American.  If you are 35-41 years of age, have your blood pressure checked every 3-5 years. If you are 68 years of age or older, have your blood pressure checked every year. You should have your blood pressure measured twice-once when you are at a hospital or clinic, and once when you are not at a hospital or clinic. Record the average of the two measurements. To check your blood pressure when you are not at a hospital or clinic, you can use: ? An automated blood pressure machine at a pharmacy. ? A home blood pressure monitor.  If you are between 62 years and 22 years old, ask your health care provider if you should take aspirin to prevent  strokes.  Have regular diabetes screenings. This involves taking a blood sample to check your fasting blood sugar level. ? If you are at a normal weight and have a low risk for diabetes, have this test once every three years after 68 years of age. ? If you are overweight and have a high risk for diabetes, consider being tested at a younger age or more often. Preventing infection Hepatitis B  If you have a higher risk for hepatitis B, you should be screened for this virus. You are considered at high risk for hepatitis B if: ? You were born in a country where hepatitis B is common. Ask your health care provider which countries are considered high risk. ? Your parents were born in a high-risk country, and you have not been immunized against hepatitis B (hepatitis B vaccine). ? You have HIV or AIDS. ? You use needles to inject street drugs. ? You live with someone who has hepatitis B. ? You have had sex with someone who has hepatitis B. ? You get hemodialysis treatment. ? You take certain medicines for conditions, including cancer, organ transplantation, and autoimmune conditions.  Hepatitis C  Blood testing is recommended for: ? Everyone born from [birth year] through 1966. ? Anyone with known risk factors for hepatitis C.  Sexually transmitted infections (STIs)  You should be screened for sexually transmitted infections (STIs) including gonorrhea and chlamydia if: ? You are sexually active and are younger than 68 years of age. ? You are older than 68 years of age and your health care provider tells you that you are at risk for this type of infection. ? Your sexual activity has changed since you were last screened and you are at an increased risk for chlamydia or gonorrhea. Ask your health care provider if you are at risk.  If you do not have HIV, but are at risk, it Michaelis be recommended that you take a prescription medicine daily to prevent HIV infection. This is called pre-exposure prophylaxis  (PrEP). You are considered at risk if: ? You are sexually active and do not regularly use condoms or know the HIV status of your partner(s). ? You take drugs by injection. ? You are sexually active with a partner who has HIV.  Talk with your health care provider about whether you are at high risk of being infected with HIV. If you choose to begin PrEP, you should first be tested for HIV. You should  then be tested every 3 months for as long as you are taking PrEP. Pregnancy  If you are premenopausal and you may become pregnant, ask your health care provider about preconception counseling.  If you may become pregnant, take 400 to 800 micrograms (mcg) of folic acid every day.  If you want to prevent pregnancy, talk to your health care provider about birth control (contraception). Osteoporosis and menopause  Osteoporosis is a disease in which the bones lose minerals and strength with aging. This can result in serious bone fractures. Your risk for osteoporosis can be identified using a bone density scan.  If you are 40 years of age or older, or if you are at risk for osteoporosis and fractures, ask your health care provider if you should be screened.  Ask your health care provider whether you should take a calcium or vitamin D supplement to lower your risk for osteoporosis.  Menopause may have certain physical symptoms and risks.  Hormone replacement therapy may reduce some of these symptoms and risks. Talk to your health care provider about whether hormone replacement therapy is right for you. Follow these instructions at home:  Schedule regular health, dental, and eye exams.  Stay current with your immunizations.  Do not use any tobacco products including cigarettes, chewing tobacco, or electronic cigarettes.  If you are pregnant, do not drink alcohol.  If you are breastfeeding, limit how much and how often you drink alcohol.  Limit alcohol intake to no more than 1 drink per day for  nonpregnant women. One drink equals 12 ounces of beer, 5 ounces of wine, or 1 ounces of hard liquor.  Do not use street drugs.  Do not share needles.  Ask your health care provider for help if you need support or information about quitting drugs.  Tell your health care provider if you often feel depressed.  Tell your health care provider if you have ever been abused or do not feel safe at home. This information is not intended to replace advice given to you by your health care provider. Make sure you discuss any questions you have with your health care provider. Document Released: 04/10/2011 Document Revised: 03/02/2016 Document Reviewed: 06/29/2015 Elsevier Interactive Patient Education  [employer].

## 2017-10-24 ENCOUNTER — Telehealth: Payer: Self-pay

## 2017-10-24 NOTE — Telephone Encounter (Signed)
PA started on CoverMyMeds [id number]

## 2017-10-25 ENCOUNTER — Encounter: Payer: Self-pay | Admitting: Internal Medicine

## 2017-10-25 ENCOUNTER — Ambulatory Visit (INDEPENDENT_AMBULATORY_CARE_PROVIDER_SITE_OTHER): Payer: Medicare HMO | Admitting: Internal Medicine

## 2017-10-25 VITALS — BP 142/82 | HR 87 | Temp 98.5°F | Resp 16 | Wt 184.0 lb

## 2017-10-25 DIAGNOSIS — J029 Acute pharyngitis, unspecified: Secondary | ICD-10-CM | POA: Diagnosis not present

## 2017-10-25 DIAGNOSIS — J329 Chronic sinusitis, unspecified: Secondary | ICD-10-CM | POA: Insufficient documentation

## 2017-10-25 LAB — POCT RAPID STREP A (OFFICE): RAPID STREP A SCREEN: NEGATIVE

## 2017-10-25 NOTE — Progress Notes (Signed)
Subjective:    Patient ID: Sonya Brady, female    DOB: 06/08/1951, 67 y.o.   MRN: [id number]  HPI She is here for an acute visit for cold symptoms.  Her symptoms started two nights ago.  She is experiencing sore throat, nasal congestion, PND, coughing due to PND, eyes itching, nose and ears itching.  She was concerned about the possibility of strep.  She denies any fevers, chills, ear pain, sinus pain, shortness of breath, wheeze, headaches or lightheadedness.  She has taken nyquil, dayquil, advil, flonase.  Medications and allergies reviewed with patient and updated if appropriate.  Patient Active Problem List   Diagnosis Date Noted  . Diet-controlled diabetes mellitus ([place]) 10/19/2017  . Chronic constipation 06/13/2016  . Routine health maintenance 07/24/2012  . Hyperlipidemia associated with type 2 diabetes mellitus ([place]) 07/23/2012  . GERD 09/05/2010  . Hypothyroidism 09/04/2007  . Essential hypertension 09/04/2007    Current Outpatient Medications on File Prior to Visit  Medication Sig Dispense Refill  . Calcium Carbonate-Vitamin D (CALCIUM + D PO) Take by mouth.    . clindamycin (CLEOCIN T) 1 % external solution Apply 3-4 drops twice weekly to affected ear until return to clinic inf 4 months    . fluticasone (FLONASE) 50 MCG/ACT nasal spray Place 1 spray into both nostrils 2 (two) times daily. 48 g 3  . latanoprost (XALATAN) 0.005 % ophthalmic solution Place 1 drop into both eyes at bedtime.    Marland Kitchen levothyroxine (SYNTHROID, LEVOTHROID) 125 MCG tablet Take 1 tablet (125 mcg total) by mouth daily. Keep January appointment for further refills 30 tablet 0  . lisinopril-hydrochlorothiazide (PRINZIDE,ZESTORETIC) 20-12.5 MG tablet Take 1 tablet by mouth daily. Keep January appointment for further refills 90 tablet 0  . Misc Natural Products (DEEP SLEEP PO) Take 200 mg by mouth once.    . Multiple Vitamins-Minerals (MULTIVITAMIN PO) Take by mouth.    . NON FORMULARY Take 450 mg  by mouth 3 (three) times daily between meals.    Marland Kitchen omeprazole (PRILOSEC) 20 MG capsule Take 1 capsule (20 mg total) by mouth daily. 30 capsule 6  . simvastatin (ZOCOR) 40 MG tablet Take 1 tablet (40 mg total) by mouth daily. Keep January appointment for further refills 90 tablet 0  . timolol (BETIMOL) 0.5 % ophthalmic solution 1 drop 2 (two) times daily.    Marland Kitchen zolpidem (AMBIEN) 5 MG tablet Take 1 tablet (5 mg total) by mouth at bedtime as needed for sleep. 30 tablet 1   No current facility-administered medications on file prior to visit.     Past Medical History:  Diagnosis Date  . Acne   . Allergy   . Breast mass, right   . Cataract   . Cholelithiasis   . Depression    situational when husband was sick and dying.  . Diverticulosis   . GERD (gastroesophageal reflux disease)   . H. pylori infection   . Hypercholesteremia   . Hypertension   . Hypothyroidism     Past Surgical History:  Procedure Laterality Date  . ABDOMINAL HYSTERECTOMY     both ovary intact  . BREAST LUMPECTOMY Bilateral   . carpel tunnel surgery Right   . TYMPANOPLASTY     tube left ear    Social History   Socioeconomic History  . Marital status: Single    Spouse name: Not on file  . Number of children: Not on file  . Years of education: Not on file  . Highest  education level: Not on file  Social Needs  . Financial resource strain: Not on file  . Food insecurity - worry: Not on file  . Food insecurity - inability: Not on file  . Transportation needs - medical: Not on file  . Transportation needs - non-medical: Not on file  Occupational History  . Occupation: bookkeeper  Tobacco Use  . Smoking status: Never Smoker  . Smokeless tobacco: Never Used  Substance and Sexual Activity  . Alcohol use: No  . Drug use: No  . Sexual activity: Not on file  Other Topics Concern  . Not on file  Social History Narrative   Married '72-widowed '97   HSG   1 daughter 01/09/1980, 1 son 08-Jan-1982: son going thru divorce  ['09]; applying to medical school   Work: bookkeeper [employer]    Family History  Problem Relation Age of Onset  . Other Father 24       deceased, unknown causes in 09-Jan-2023  . Hypertension Mother   . Other Mother 86       SBO, deceased  . Thyroid disease Brother   . Thyroid disease Sister   . Breast cancer Neg Hx   . Colon cancer Neg Hx   . Esophageal cancer Neg Hx   . Rectal cancer Neg Hx   . Stomach cancer Neg Hx     Review of Systems  Constitutional: Negative for chills and fever.  HENT: Positive for congestion, postnasal drip and sore throat. Negative for ear pain, sinus pressure and sneezing.   Respiratory: Positive for cough (dry cough - due to PND). Negative for shortness of breath and wheezing.   Neurological: Negative for light-headedness and headaches.       Objective:   Vitals:   10/25/17 1142  BP: (!) 142/82  Pulse: 87  Resp: 16  Temp: 98.5 F (36.9 C)  SpO2: 98%   Filed Weights   10/25/17 1142  Weight: 184 lb (83.5 kg)   Body mass index is 29.7 kg/m.  Wt Readings from Last 3 Encounters:  10/25/17 184 lb (83.5 kg)  10/19/17 183 lb (83 kg)  03/01/17 179 lb 12.8 oz (81.6 kg)     Physical Exam GENERAL APPEARANCE: Appears stated age, well appearing, NAD EYES: conjunctiva clear, no icterus HEENT: bilateral tympanic membranes and ear canals normal, oropharynx with mild erythema, no thyromegaly, trachea midline, no cervical or supraclavicular lymphadenopathy LUNGS: Clear to auscultation without wheeze or crackles, unlabored breathing, good air entry bilaterally CARDIOVASCULAR: Normal S1,S2 without murmurs, no edema SKIN: warm, dry        Assessment & Plan:   See Problem List for Assessment and Plan of chronic medical problems.

## 2017-10-25 NOTE — Assessment & Plan Note (Signed)
Sore throat associated with nasal congestion and other URI symptoms Rapid strep negative Likely viral Discussed symptomatic treatment-continue over-the-counter cold medications Advil or Aleve for sore throat Rest, fluids Call if no improvement

## 2017-10-26 ENCOUNTER — Encounter: Payer: Self-pay | Admitting: Internal Medicine

## 2017-10-26 ENCOUNTER — Telehealth: Payer: Self-pay | Admitting: Internal Medicine

## 2017-10-26 MED ORDER — METFORMIN HCL 500 MG PO TABS: 500.0000 mg | ORAL_TABLET | Freq: Two times a day (BID) | ORAL | 3 refills | Status: DC

## 2017-10-26 NOTE — Telephone Encounter (Signed)
PA approved 10/07/2017-10/08/2018

## 2017-10-26 NOTE — Telephone Encounter (Signed)
Copied from [place] #[id number]. Topic: Quick Communication - Lab Results >> Oct 26, 2017  2:04 PM Lolita Rieger, [place] wrote: Pt stated that she saw her results on my cart and would like to discuss possible medications Please contact pt [phone]

## 2017-10-26 NOTE — Telephone Encounter (Signed)
We would recommend to start metformin for the sugars. This is 1 pill with breakfast for the first week then 1 pill twice a day with meals to continue onwards. We have sent this in and should see her back in about 3 months.

## 2017-10-26 NOTE — Telephone Encounter (Signed)
Patient wanted an appointment to follow up on labs and talk about the medications she was scheduled for 10/29/2017 by [facility]

## 2017-10-27 DIAGNOSIS — J069 Acute upper respiratory infection, unspecified: Secondary | ICD-10-CM | POA: Diagnosis not present

## 2017-10-29 ENCOUNTER — Encounter: Payer: Self-pay | Admitting: Internal Medicine

## 2017-10-29 ENCOUNTER — Ambulatory Visit (INDEPENDENT_AMBULATORY_CARE_PROVIDER_SITE_OTHER): Payer: Medicare HMO | Admitting: Internal Medicine

## 2017-10-29 DIAGNOSIS — E039 Hypothyroidism, unspecified: Secondary | ICD-10-CM | POA: Diagnosis not present

## 2017-10-29 DIAGNOSIS — E1165 Type 2 diabetes mellitus with hyperglycemia: Secondary | ICD-10-CM

## 2017-10-29 DIAGNOSIS — J0111 Acute recurrent frontal sinusitis: Secondary | ICD-10-CM

## 2017-10-29 MED ORDER — LEVOTHYROXINE SODIUM 137 MCG PO TABS: 137.0000 ug | ORAL_TABLET | Freq: Every day | ORAL | 3 refills | Status: DC

## 2017-10-29 MED ORDER — AMOXICILLIN-POT CLAVULANATE 875-125 MG PO TABS: 1.0000 | ORAL_TABLET | Freq: Two times a day (BID) | ORAL | 0 refills | Status: DC

## 2017-10-29 MED ORDER — METFORMIN HCL 500 MG PO TABS: 500.0000 mg | ORAL_TABLET | Freq: Two times a day (BID) | ORAL | 3 refills | Status: DC

## 2017-10-29 NOTE — Assessment & Plan Note (Signed)
Rx for augmentin for sinuses. She can continue using flonase and cough medicine. Can use ibuprofen for pain in throat or chloroseptic spray.

## 2017-10-29 NOTE — Assessment & Plan Note (Signed)
Given increase in symptoms will increase dosing from 125 mcg daily to 137 mcg daily and needs repeat labs in 3 months at follow up.

## 2017-10-29 NOTE — Assessment & Plan Note (Signed)
Not controlled recently and new rx for metformin. She is on statin and ACE-I. Referral for diabetes education. She is motivated for changed.

## 2017-10-29 NOTE — Patient Instructions (Signed)
We have sent in the augmentin for the sinuses. Take 1 pill twice a day for 1 week.   We have sent in the higher dose of the thyroid medicine as well.   We have also sent in metformin for the sugars to take 1 pill daily for the first week. Then increase to 1 pill twice a day with food. We will get you in for education about the diabetes.

## 2017-10-29 NOTE — Progress Notes (Signed)
   Subjective:    Patient ID: Sonya Brady, female    DOB: 1951-09-25, 67 y.o.   MRN: [id number]  HPI The patient is a 67 YO female coming in for several problems including follow up of diabetes (she was diagnosed last year and did not admit this, repeat labs were again consistent with diabetes and she wants to address this, has not started metformin, she denies changing diet and has been under a lot of stress recently which has caused poor diet, denies exercise recently), and her thyroid (she saw levels with slightly worse this year and she is feeling more fatigued and wants to know if we can increase slightly, denies heat intolerance, some cold intolerance and weight gain), and her new problem of throat pain (seen at our clinic last week and advised ibuprofen and otc cough medicine, then worsened over the weekend and she was seen at an urgent care and given albuterol inhaler and stronger cough medicine, she is still only sleeping 2 hours per night, coughing some, some SOB, lots of sinus drainage, denies fevers or chills, no muscle aches, started 8 days ago and overall still worsening).   Review of Systems  Constitutional: Positive for activity change, appetite change and chills. Negative for fatigue, fever and unexpected weight change.  HENT: Positive for congestion, postnasal drip, rhinorrhea, sinus pressure, sinus pain and sore throat. Negative for ear discharge, ear pain, sneezing, tinnitus, trouble swallowing and voice change.   Eyes: Negative.   Respiratory: Positive for cough and shortness of breath. Negative for chest tightness and wheezing.   Cardiovascular: Negative.   Gastrointestinal: Negative.   Endocrine: Positive for cold intolerance. Negative for heat intolerance, polydipsia, polyphagia and polyuria.  Musculoskeletal: Negative for arthralgias, back pain and myalgias.  Skin: Negative.   Neurological: Negative.   Psychiatric/Behavioral: Negative.       Objective:   Physical Exam    Constitutional: She is oriented to person, place, and time. She appears well-developed and well-nourished.  HENT:  Head: Normocephalic and atraumatic.  Oropharynx with redness and clear drainage, nose with swollen turbinates, TMs normal right, tube present in the left TM appears clogged, frontal sinus pain  Eyes: EOM are normal.  Neck: Normal range of motion. No thyromegaly present.  Cardiovascular: Normal rate and regular rhythm.  Pulmonary/Chest: Effort normal and breath sounds normal. No respiratory distress. She has no wheezes. She has no rales.  Abdominal: Soft. Bowel sounds are normal. She exhibits no distension. There is no tenderness. There is no rebound.  Musculoskeletal: She exhibits no edema or tenderness.  Lymphadenopathy:    She has cervical adenopathy.  Neurological: She is alert and oriented to person, place, and time.  Skin: Skin is warm and dry.  Psychiatric: She has a normal mood and affect.   Vitals:   10/29/17 0842  BP: 140/82  Pulse: 90  Temp: 98.9 F (37.2 C)  TempSrc: Oral  SpO2: 99%  Weight: 187 lb 0.6 oz (84.8 kg)  Height: 5\' 6"  (1.676 m)      Assessment & Plan:

## 2017-11-05 ENCOUNTER — Telehealth: Payer: Self-pay | Admitting: Internal Medicine

## 2017-11-05 MED ORDER — LEVOTHYROXINE SODIUM 150 MCG PO TABS
150.0000 ug | ORAL_TABLET | Freq: Every day | ORAL | 0 refills | Status: DC
Start: 2017-11-05 — End: 2017-11-07

## 2017-11-05 NOTE — Telephone Encounter (Signed)
Copied from [place]. Topic: General - Other >> Nov 05, 2017  1:25 PM Darl Householder, RMA wrote: Reason for CRM: Patient is requesting a change in medication dosage for Levothyroxine 137 mg to 150 mcg due to medication is on back order at pharmacy but the 150 mcg dosage is available, please call pt if this is ok.

## 2017-11-05 NOTE — Telephone Encounter (Signed)
Sent in 1 month supply of 150 mcg synthroid and then should resume the 137 mcg daily.

## 2017-11-05 NOTE — Telephone Encounter (Signed)
Patient informed. 

## 2017-11-07 ENCOUNTER — Telehealth: Payer: Self-pay | Admitting: Internal Medicine

## 2017-11-07 MED ORDER — LEVOTHYROXINE SODIUM 150 MCG PO TABS: 150.0000 ug | ORAL_TABLET | Freq: Every day | ORAL | 0 refills | Status: DC

## 2017-11-07 NOTE — Telephone Encounter (Signed)
Copied from [place]. Topic: General - Other >> Nov 05, 2017  1:25 PM Darl Householder, RMA wrote: Reason for CRM: Patient is requesting a change in medication dosage for Levothyroxine 137 mg to 150 mcg due to medication is on back order at pharmacy but the 150 mcg dosage is available, please call pt if this is ok.   >> Nov 06, 2017 12:36 PM Boyd Kerbs wrote: Pt does not have the mail order pharmacy that prescription was sent to.  She is needing this sent to [facility] on [address].   Please resend to [facility] >> Nov 07, 2017 10:02 AM Robina Ade, Helene Kelp D wrote: Patient called and said that her medication levothyroxine (SYNTHROID, LEVOTHROID) 150 MCG tablet needs to be sent to [facility] on [address] instead.   Spoke with Nira Conn, pharmacy technician at [facility]; she states that they do not have this prescription per Dr Sharlet Salina; will send to pharmacy

## 2017-11-13 ENCOUNTER — Telehealth: Payer: Self-pay | Admitting: Internal Medicine

## 2017-11-13 ENCOUNTER — Encounter: Payer: Self-pay | Admitting: Physician Assistant

## 2017-11-13 ENCOUNTER — Ambulatory Visit: Payer: Medicare HMO | Admitting: Physician Assistant

## 2017-11-13 DIAGNOSIS — R1032 Left lower quadrant pain: Secondary | ICD-10-CM

## 2017-11-13 MED ORDER — METRONIDAZOLE 250 MG PO TABS: 250.0000 mg | ORAL_TABLET | Freq: Three times a day (TID) | ORAL | 0 refills | Status: DC

## 2017-11-13 MED ORDER — LEVOFLOXACIN 250 MG PO TABS
250.0000 mg | ORAL_TABLET | Freq: Every day | ORAL | 0 refills | Status: DC
Start: 2017-11-13 — End: 2017-12-24

## 2017-11-13 NOTE — Telephone Encounter (Signed)
Pt states for the past 3 days she has been having LLQ pain along with bloating. Pt thinks she is having a diverticulitis flare. Pt scheduled to see Ellouise Newer PA today at 3:15pm. Pt aware of appt.

## 2017-11-13 NOTE — Progress Notes (Signed)
Chief Complaint: Left lower quadrant abdominal pain  HPI:    Mrs. Sonya Brady is a 67 year old female with a past medical history as listed below who is known to Dr. Henrene Pastor, and presents to clinic today with a complaint of left lower quadrant abdominal pain.      Patient had a colonoscopy 4/18 with a finding of 2 polyps which were removed and hyperplastic via pathology.  She also had diverticulosis in the left and right colon.  Patient has a history of being treated for diverticulitis off and on throughout the years, but only has one radiographic imaging study from 2011 to actually confirm this diagnosis.  Her last treatment was 03/01/17 when seeing Alonza Bogus, PA-C and complaining of the left-sided and suprapubic abdominal pain.  Patient was given Levaquin 250 mg twice a day for 10 days and Flagyl 250 mg 3 times a day for 10 days at that time after complaining of side effects from ciprofloxacin in the past and asking for a lower dosage of medication in general.  It was recommended she have a CT scan but she declined at that time.    Today, the patient presents to clinic and explains that on 11/10/16 she started with a left lower quadrant abdominal pain and bloating which has increased in severity until today. This is rated as an 8-9/10.  The patient describes that now she feels entirely bloated across her entire lower abdomen and tells me that she has had a decrease in bowel movements.  Apparently, on Saturday she started with 5-6 loose stools and has had a decreased amount until today.  Her normal has been 2-3 times a day ever since increasing fiber in her diet with her healthier lifestyle start in January.  She does drink plenty of water.  Patient does describe some chills which occurred on Sunday night but went away after a hot shower.  She denies any documented fever.  She denies any rectal bleeding.    Patient does admit to eating a large amount of salad on a daily basis, in fact she had a salad for  breakfast and lunch today.    Patient denies weight loss, anorexia, nausea, vomiting, heartburn or reflux.  Past Medical History:  Diagnosis Date  . Acne   . Allergy   . Breast mass, right   . Cataract   . Cholelithiasis   . Depression    situational when husband was sick and dying.  . Diverticulosis   . GERD (gastroesophageal reflux disease)   . H. pylori infection   . Hypercholesteremia   . Hypertension   . Hypothyroidism     Past Surgical History:  Procedure Laterality Date  . ABDOMINAL HYSTERECTOMY     both ovary intact  . BREAST LUMPECTOMY Bilateral   . carpel tunnel surgery Right   . TYMPANOPLASTY     tube left ear    Current Outpatient Medications  Medication Sig Dispense Refill  . Calcium Carbonate-Vitamin D (CALCIUM + D PO) Take by mouth.    . clindamycin (CLEOCIN T) 1 % external solution Apply 3-4 drops twice weekly to affected ear until return to clinic inf 4 months    . fluticasone (FLONASE) 50 MCG/ACT nasal spray Place 1 spray into both nostrils 2 (two) times daily. 48 g 3  . latanoprost (XALATAN) 0.005 % ophthalmic solution Place 1 drop into both eyes at bedtime.    Marland Kitchen levothyroxine (SYNTHROID, LEVOTHROID) 137 MCG tablet Take 1 tablet (137 mcg total) by mouth daily.  Keep 11/26/22 appointment for further refills 90 tablet 3  . levothyroxine (SYNTHROID, LEVOTHROID) 150 MCG tablet Take 1 tablet (150 mcg total) by mouth daily. 30 tablet 0  . lisinopril-hydrochlorothiazide (PRINZIDE,ZESTORETIC) 20-12.5 MG tablet Take 1 tablet by mouth daily. Keep November 26, 2022 appointment for further refills 90 tablet 0  . metFORMIN (GLUCOPHAGE) 500 MG tablet Take 1 tablet (500 mg total) by mouth 2 (two) times daily with a meal. 180 tablet 3  . Misc Natural Products (DEEP SLEEP PO) Take 200 mg by mouth once.    . Multiple Vitamins-Minerals (MULTIVITAMIN PO) Take by mouth.    . NON FORMULARY Take 450 mg by mouth 3 (three) times daily between meals.    Marland Kitchen omeprazole (PRILOSEC) 20 MG capsule  Take 1 capsule (20 mg total) by mouth daily. 30 capsule 6  . simvastatin (ZOCOR) 40 MG tablet Take 1 tablet (40 mg total) by mouth daily. Keep 11/26/2022 appointment for further refills 90 tablet 0  . timolol (BETIMOL) 0.5 % ophthalmic solution 1 drop 2 (two) times daily.    Marland Kitchen zolpidem (AMBIEN) 5 MG tablet Take 1 tablet (5 mg total) by mouth at bedtime as needed for sleep. 30 tablet 1   No current facility-administered medications for this visit.     Allergies as of 11/13/2017  . (No Known Allergies)    Family History  Problem Relation Age of Onset  . Other Father 3       deceased, unknown causes in 11/26/22  . Hypertension Mother   . Other Mother 21       SBO, deceased  . Thyroid disease Brother   . Thyroid disease Sister   . Breast cancer Neg Hx   . Colon cancer Neg Hx   . Esophageal cancer Neg Hx   . Rectal cancer Neg Hx   . Stomach cancer Neg Hx     Social History   Socioeconomic History  . Marital status: Single    Spouse name: Not on file  . Number of children: Not on file  . Years of education: Not on file  . Highest education level: Not on file  Social Needs  . Financial resource strain: Not on file  . Food insecurity - worry: Not on file  . Food insecurity - inability: Not on file  . Transportation needs - medical: Not on file  . Transportation needs - non-medical: Not on file  Occupational History  . Occupation: bookkeeper  Tobacco Use  . Smoking status: Never Smoker  . Smokeless tobacco: Never Used  Substance and Sexual Activity  . Alcohol use: No  . Drug use: No  . Sexual activity: Not on file  Other Topics Concern  . Not on file  Social History Narrative   Married '72-widowed '97   HSG   1 daughter 11/27/1979, 1 son 1981/11/26: son going thru divorce ['09]; applying to medical school   Work: bookkeeper [employer]    Review of Systems:    Constitutional: No weight loss Cardiovascular: No chest pain Respiratory: No SOB  Gastrointestinal: See HPI and  otherwise negative   Physical Exam:  Vital signs: BP 124/78   Pulse 87   Ht 5' 6" (1.676 m)   Wt 181 lb (82.1 kg)   BMI 29.21 kg/m   Constitutional:   Pleasant overweight female appears to be in NAD, Well developed, Well nourished, alert and cooperative Respiratory: Respirations even and unlabored. Lungs clear to auscultation bilaterally.   No wheezes, crackles, or rhonchi.  Cardiovascular: Normal  S1, S2. No MRG. Regular rate and rhythm. No peripheral edema, cyanosis or pallor.  Gastrointestinal:  Soft, mild distension, moderate LLQ ttp and mild RLQ ttp, No rebound or guarding. Normal bowel sounds. No appreciable masses or hepatomegaly. Psychiatric:  Demonstrates good judgement and reason without abnormal affect or behaviors.  MOST RECENT LABS AND IMAGING: CBC    Component Value Date/Time   WBC 5.2 10/19/2017 0921   RBC 4.88 10/19/2017 0921   HGB 14.5 10/19/2017 0921   HCT 43.0 10/19/2017 0921   PLT 249.0 10/19/2017 0921   MCV 88.1 10/19/2017 0921   MCHC 33.8 10/19/2017 0921   RDW 13.0 10/19/2017 0921   LYMPHSABS 3.2 06/15/2016 1304   MONOABS 0.7 06/15/2016 1304   EOSABS 0.1 06/15/2016 1304   BASOSABS 0.0 06/15/2016 1304    CMP     Component Value Date/Time   NA 135 10/19/2017 0921   K 3.7 10/19/2017 0921   CL 98 10/19/2017 0921   CO2 27 10/19/2017 0921   GLUCOSE 255 (H) 10/19/2017 0921   BUN 11 10/19/2017 0921   CREATININE 0.68 10/19/2017 0921   CALCIUM 9.7 10/19/2017 0921   PROT 7.8 10/19/2017 0921   ALBUMIN 4.7 10/19/2017 0921   AST 15 10/19/2017 0921   ALT 19 10/19/2017 0921   ALKPHOS 78 10/19/2017 0921   BILITOT 0.8 10/19/2017 0921   GFRNONAA >60 01/17/2010 0500   GFRAA  01/17/2010 0500    >60        The eGFR has been calculated using the MDRD equation. This calculation has not been validated in all clinical situations. eGFR's persistently <60 mL/min signify possible Chronic Kidney Disease.    Assessment: 1.  Left lower quadrant abdominal pain:  Again suspect diverticulitis, patient has been treated on and off for this throughout the years but there is only one radiographic imaging study from 2011 to actually confirm this diagnosis, colonoscopy in 2018 with diverticulosis left and right-sided, discussed CT today and patient agrees  Plan: 1.  Again discussed with the patient that I suspect this is likely diverticulitis, but recommended a CT of the abdomen and pelvis for further evaluation due to recurrence of symptoms.  She agreed today. 2.  Scheduled patient for a CT abdomen pelvis with contrast, this will be performed tomorrow. 3.  Prescribed Levaquin 250 mg twice daily times 10 days and Flagyl 250 mg 3 times daily times 10 days.  Again this is at a decreased dosage per patient request and patient describes side effects of ciprofloxacin in the past.  These antibiotics worked for her during her last "episode". 4.  Recommend a low fiber/ low residue now and as long as she continues with any discomfort.  She may then resume her high-fiber diet. 5.  Encouraged increased water intake at least 6-8 eight ounce glasses of water per day. 6.  Patient to follow in clinic as needed in the future or as directed after imaging above.  Ellouise Newer, PA-C [facility] 11/13/2017, 3:38 PM  Cc: Hoyt Koch, *

## 2017-11-13 NOTE — Progress Notes (Signed)
Physician assistant assessment and plans reviewed 

## 2017-11-13 NOTE — Patient Instructions (Signed)
We have sent the following medications to your pharmacy for you to pick up at your convenience: Levaquin 250 mg twice a day for 10 days.  Flagyl 250 mg three times a day for 10 days.   You have been scheduled for a CT scan of the abdomen and pelvis at [facility] ([address]---this is in the same building as Press [facility]).   You are scheduled on 11-14-17 at 9 am. You should arrive 15 minutes prior to your appointment time for registration. Please follow the written instructions below on the day of your exam:  WARNING: IF YOU ARE ALLERGIC TO IODINE/X-RAY DYE, PLEASE NOTIFY RADIOLOGY IMMEDIATELY AT [phone]! YOU WILL BE GIVEN A 13 HOUR PREMEDICATION PREP.  1) Do not eat anything after 5 am (4 hours prior to your test) 2) You have been given 2 bottles of oral contrast to drink. The solution may taste               better if refrigerated, but do NOT add ice or any other liquid to this solution. Shake             well before drinking.    Drink 1 bottle of contrast @ 7 am (2 hours prior to your exam)  Drink 1 bottle of contrast @ 8 am (1 hour prior to your exam)  You may take any medications as prescribed with a small amount of water except for the following: Metformin, Glucophage, Glucovance, Avandamet, Riomet, Fortamet, Actoplus Met, Janumet, Glumetza or Metaglip. The above medications must be held the day of the exam AND 48 hours after the exam.  The purpose of you drinking the oral contrast is to aid in the visualization of your intestinal tract. The contrast solution may cause some diarrhea. Before your exam is started, you will be given a small amount of fluid to drink. Depending on your individual set of symptoms, you may also receive an intravenous injection of x-ray contrast/dye. Plan on being at [facility] for 30 minutes or longer, depending on the type of exam you are having performed.  This test typically takes 30-45 minutes to complete.  If you have  any questions regarding your exam or if you need to reschedule, you may call the CT department at [phone] between the hours of 8:00 am and 5:00 pm, Monday-Friday.  ________________________________________________________________________

## 2017-11-14 ENCOUNTER — Telehealth: Payer: Self-pay | Admitting: Internal Medicine

## 2017-11-14 ENCOUNTER — Ambulatory Visit (INDEPENDENT_AMBULATORY_CARE_PROVIDER_SITE_OTHER)
Admission: RE | Admit: 2017-11-14 | Discharge: 2017-11-14 | Disposition: A | Discharge status: Home / Self Care | Payer: Medicare HMO | Source: Ambulatory Visit | Attending: Physician Assistant | Admitting: Physician Assistant

## 2017-11-14 DIAGNOSIS — R109 Unspecified abdominal pain: Secondary | ICD-10-CM | POA: Diagnosis not present

## 2017-11-14 DIAGNOSIS — R1032 Left lower quadrant pain: Secondary | ICD-10-CM

## 2017-11-14 MED ORDER — IOPAMIDOL (ISOVUE-300) INJECTION 61%
100.0000 mL | Freq: Once | INTRAVENOUS | Status: AC | PRN
  Administered 2017-11-14: 100 mL via INTRAVENOUS

## 2017-11-14 NOTE — Telephone Encounter (Signed)
Spoke to pt and she is aware.

## 2017-11-14 NOTE — Telephone Encounter (Signed)
Copied from [place]. Topic: Referral - Status >> Nov 14, 2017  1:25 PM Robina Ade, Helene Kelp D wrote: Reason for CRM: Patient is calling to know the status of her referral to see a nutrition. She saw Dr. Sharlet Salina last and at the visit she told her she would put in the referral for her. Please call patient back to let her know any update on this.

## 2017-11-14 NOTE — Telephone Encounter (Signed)
Can you please place referral for nutrition.

## 2017-11-15 ENCOUNTER — Other Ambulatory Visit: Payer: Self-pay | Admitting: Internal Medicine

## 2017-11-15 DIAGNOSIS — E1165 Type 2 diabetes mellitus with hyperglycemia: Secondary | ICD-10-CM

## 2017-11-15 NOTE — Telephone Encounter (Signed)
Aware of what? Is there a question?

## 2017-11-15 NOTE — Telephone Encounter (Signed)
Order placed

## 2017-11-15 NOTE — Telephone Encounter (Signed)
Yes, can you please put the referral in for Nutrition

## 2017-11-16 ENCOUNTER — Telehealth: Payer: Self-pay | Admitting: Physician Assistant

## 2017-11-16 NOTE — Telephone Encounter (Signed)
The pt has been advised to take her abx (levaquin) twice daily for 10 days and flagyl three times daily for 10 days.

## 2017-11-20 DIAGNOSIS — R69 Illness, unspecified: Secondary | ICD-10-CM | POA: Diagnosis not present

## 2017-11-21 ENCOUNTER — Telehealth: Payer: Self-pay | Admitting: Physician Assistant

## 2017-11-21 MED ORDER — ONDANSETRON HCL 4 MG PO TABS: ORAL_TABLET | ORAL | 0 refills | Status: DC

## 2017-11-21 NOTE — Telephone Encounter (Signed)
Patient calling back in regards of this states right now she is bloated and antibiotics are not helping with this. Best call back [phone].

## 2017-11-21 NOTE — Telephone Encounter (Signed)
Patient reports terrible nausea with antibiotics.  She is unable to eat.  She has several more days of antibiotics to take. She states she is not able to eat.  She is asking for something for nausea.  Please advise

## 2017-11-21 NOTE — Telephone Encounter (Signed)
Patient notified of the recommendations.  

## 2017-11-21 NOTE — Telephone Encounter (Signed)
Please give Zofran 8mg  q4-6 hours as needed for nausea. Thank you-JLL  If she continues to feel bad, she can come back in to discuss with me or other available provider. Thanks-JLL

## 2017-11-27 DIAGNOSIS — R69 Illness, unspecified: Secondary | ICD-10-CM | POA: Diagnosis not present

## 2017-11-28 ENCOUNTER — Telehealth: Payer: Self-pay | Admitting: Internal Medicine

## 2017-11-28 DIAGNOSIS — E119 Type 2 diabetes mellitus without complications: Secondary | ICD-10-CM

## 2017-11-28 NOTE — Telephone Encounter (Signed)
Copied from [place] [id number]. Topic: Inquiry >> Nov 28, 2017  4:02 PM Margot Ables wrote: Needs referral to podiatry due to dx of diabetes (from Dr. Sharlet Salina) and pain in both feet - L is worse Needs referral to endocrinology due to dx of diabetes (from Dr. Sharlet Salina) Requesting change on medication for cholesterol - pt son is a doctor and he advised pt to call and request change to atorvastatin 80mg . Requesting provider to review CT scan ordered by GI for abnormality and offer advice.

## 2017-11-29 ENCOUNTER — Encounter: Payer: Self-pay | Admitting: Internal Medicine

## 2017-11-29 MED ORDER — SIMVASTATIN 80 MG PO TABS: 80.0000 mg | ORAL_TABLET | Freq: Every day | ORAL | 3 refills | Status: DC

## 2017-11-29 NOTE — Telephone Encounter (Signed)
Sent in podiatry and endocrine referral. Sent in simvastatin 80 mg to take daily. The CT scan of the stomach showed diverticulitis (for which she is taking antibiotics). It showed fatty liver for which there is no treatment except working on diet and exercise. There was a gallstone but no sign of infection of the gallbladder. There is no need to do anything with this for now. The kidneys are normal (small cyst which is very common and not abnormal).

## 2017-11-29 NOTE — Telephone Encounter (Signed)
LVM informing patient of MD response and to call back with any questions  

## 2017-11-30 DIAGNOSIS — Z9629 Presence of other otological and audiological implants: Secondary | ICD-10-CM | POA: Diagnosis not present

## 2017-11-30 DIAGNOSIS — H7312 Chronic myringitis, left ear: Secondary | ICD-10-CM | POA: Diagnosis not present

## 2017-11-30 MED ORDER — ATORVASTATIN CALCIUM 80 MG PO TABS: 80.0000 mg | ORAL_TABLET | Freq: Every day | ORAL | 3 refills | Status: DC

## 2017-12-04 ENCOUNTER — Encounter: Payer: Self-pay | Admitting: Registered"

## 2017-12-04 ENCOUNTER — Encounter: Payer: Medicare HMO | Attending: Internal Medicine | Admitting: Registered"

## 2017-12-04 DIAGNOSIS — Z713 Dietary counseling and surveillance: Secondary | ICD-10-CM | POA: Diagnosis not present

## 2017-12-04 DIAGNOSIS — E1165 Type 2 diabetes mellitus with hyperglycemia: Secondary | ICD-10-CM | POA: Diagnosis not present

## 2017-12-04 DIAGNOSIS — E119 Type 2 diabetes mellitus without complications: Secondary | ICD-10-CM

## 2017-12-04 NOTE — Patient Instructions (Signed)
Aim to eat balanced meals and snacks and include protein whenever you have a carbohydrate Sleep is important for good health and controlling blood sugar. Keep up the great exercise routine you have as you enjoy and tolerate it Try Fairlife Milk for more protein with cereal. For a week test blood sugar:  Test blood sugar in the morning before you eat with a target of 80-130  Test blood sugar 2 hours after a meal, target less than 180  Call Insurance company to see if they cover supplies for the OneTouch meter I gave you. If they do ask your doctor for a prescription

## 2017-12-04 NOTE — Progress Notes (Signed)
Diabetes Self-Management Education  Visit Type: First/Initial  Appt. Start Time: 1610 Appt. End Time: 8119  12/04/2017  [name], identified by name and date of birth, is a 67 y.o. female with a diagnosis of Diabetes: Type 2.   ASSESSMENT Pt states she has been under a lot of stress due to brother's death last year and continuing family conflict over estate issues. Patient states she is learning to take care of her own needs and not do things that hurt her health to accommodate other people's needs.  Pt states she wants ideas for more foods she can eat. Pt reports she used to eat rice cakes but stopped because son who is a doctor said too much carb. Patient sometimes is hungry at bedtime, but won't eat because she doesn't want acid reflux. Patient states concern about diverticulitis which she has had for 10 yrs, and 2 extremely painful flares this last year.  Patient was given a glucose monitor and expressed happiness when she saw the reading of 113 mg/dL because the last time she saw it it was over 200.  Meter provided: [employer] Flex Lot #: [id number] X Exp: 11/08/2018 BG: 113 mg/dL  Diabetes Self-Management Education - 12/04/17 1629      Visit Information   Visit Type  First/Initial      Initial Visit   Diabetes Type  Type 2    Are you currently following a meal plan?  Yes    What type of meal plan do you follow?  avoids carbs to control sugar, and avoids some fiber due to diverticulitis    Are you taking your medications as prescribed?  Yes    Date Diagnosed  Jan 2019      Health Coping   How would you rate your overall health?  Excellent      Psychosocial Assessment   Patient Belief/Attitude about Diabetes  Motivated to manage diabetes    How often do you need to have someone help you when you read instructions, pamphlets, or other written materials from your doctor or pharmacy?  3 - Sometimes    What is the last grade level you completed in school?  high school       Complications   Last HgB A1C per patient/outside source  8.6 %    How often do you check your blood sugar?  0 times/day (not testing)    Have you had a dilated eye exam in the past 12 months?  No    Have you had a dental exam in the past 12 months?  Yes    Are you checking your feet?  Yes    How many days per week are you checking your feet?  7      Dietary Intake   Breakfast  boiled egg whites, cube of blue cheese, 1/2 apple (no skin), cheese, 1 PB cracker,     Lunch  steamed vegetables, carrots, brussel sprout, asparagus, apple, OR 1/4 orange, a little rice, okra    Dinner  okra, 2 TBs rice, 1/2 ww pita bread, slice of mango, 1/2 apple    Snack (evening)  none OR     Beverage(s)  water, 1x week dilute 1/4 c OJ with water, coffee little sugar-free creamer, herbal tea no sweetner      Exercise   Exercise Type  Light (walking / raking leaves)    How many days per week to you exercise?  6    How many minutes per day do  you exercise?  45    Total minutes per week of exercise  270      Patient Education   Previous Diabetes Education  No    Disease state   Definition of diabetes, type 1 and 2, and the diagnosis of diabetes;Factors that contribute to the development of diabetes    Nutrition management   Role of diet in the treatment of diabetes and the relationship between the three main macronutrients and blood glucose level    Physical activity and exercise   Role of exercise on diabetes management, blood pressure control and cardiac health.    Monitoring  Taught/evaluated SMBG meter.;Identified appropriate SMBG and/or A1C goals.      Individualized Goals (developed by patient)   Nutrition  General guidelines for healthy choices and portions discussed    Physical Activity  Exercise 5-7 days per week    Medications  take my medication as prescribed    Monitoring   test my blood glucose as discussed      Outcomes   Expected Outcomes  Demonstrated interest in learning. Expect  positive outcomes    Future DMSE  4-6 wks    Program Status  Completed     Individualized Plan for Diabetes Self-Management Training:   Learning Objective:  Patient will have a greater understanding of diabetes self-management. Patient education plan is to attend individual and/or group sessions per assessed needs and concerns.   Patient Instructions  Aim to eat balanced meals and snacks and include protein whenever you have a carbohydrate Sleep is important for good health and controlling blood sugar. Keep up the great exercise routine you have as you enjoy and tolerate it Try Fairlife Milk for more protein with cereal. For a week test blood sugar:  Test blood sugar in the morning before you eat with a target of 80-130  Test blood sugar 2 hours after a meal, target less than 180  Call Insurance company to see if they cover supplies for the OneTouch meter I gave you. If they do ask your doctor for a prescription  Expected Outcomes:  Demonstrated interest in learning. Expect positive outcomes  Education material provided: A1C conversion sheet and My Plate  If problems or questions, patient to contact team via:  Phone and MyChart  Future DSME appointment: 4-6 wks

## 2017-12-24 ENCOUNTER — Ambulatory Visit: Payer: Medicare HMO | Admitting: Endocrinology

## 2017-12-24 ENCOUNTER — Telehealth: Payer: Self-pay | Admitting: Endocrinology

## 2017-12-24 ENCOUNTER — Encounter: Payer: Self-pay | Admitting: Endocrinology

## 2017-12-24 VITALS — BP 122/72 | HR 83 | Ht 66.0 in | Wt 168.0 lb

## 2017-12-24 DIAGNOSIS — E1165 Type 2 diabetes mellitus with hyperglycemia: Secondary | ICD-10-CM

## 2017-12-24 DIAGNOSIS — E049 Nontoxic goiter, unspecified: Secondary | ICD-10-CM | POA: Diagnosis not present

## 2017-12-24 LAB — HEMOGLOBIN A1C: Hgb A1c MFr Bld: 6.6 % — ABNORMAL HIGH (ref 4.6–6.5)

## 2017-12-24 LAB — T4, FREE: Free T4: 1.24 ng/dL (ref 0.60–1.60)

## 2017-12-24 LAB — TSH: TSH: 0.29 u[IU]/mL — ABNORMAL LOW (ref 0.35–4.50)

## 2017-12-24 MED ORDER — METFORMIN HCL ER 500 MG PO TB24: 2000.0000 mg | ORAL_TABLET | Freq: Every day | ORAL | 3 refills | Status: DC

## 2017-12-24 MED ORDER — LEVOTHYROXINE SODIUM 125 MCG PO TABS: 125.0000 ug | ORAL_TABLET | Freq: Every day | ORAL | 3 refills | Status: DC

## 2017-12-24 NOTE — Progress Notes (Signed)
Subjective:    Patient ID: Sonya Brady, female    DOB: [birth year], 66 y.o.   MRN: [id number]  HPI pt is referred by for diabetes.  Pt states DM was dx'ed in 2017; she has mild if any neuropathy of the lower extremities; she is unaware of any associated chronic complications; she has never been on insulin; pt says her diet and exercise are good; she has never had GDM, pancreatitis, pancreatic surgery, severe hypoglycemia or DKA.   She takes synthroid 137 mcg/d, for hypothyroidism.   Past Medical History:  Diagnosis Date  . Acne   . Allergy   . Breast mass, right   . Cataract   . Cholelithiasis   . Depression    situational when husband was sick and dying.  . Diverticulosis   . GERD (gastroesophageal reflux disease)   . H. pylori infection   . Hypercholesteremia   . Hypertension   . Hypothyroidism     Past Surgical History:  Procedure Laterality Date  . ABDOMINAL HYSTERECTOMY     both ovary intact  . BREAST LUMPECTOMY Bilateral   . carpel tunnel surgery Right   . TYMPANOPLASTY     tube left ear    Social History   Socioeconomic History  . Marital status: Single    Spouse name: Not on file  . Number of children: Not on file  . Years of education: Not on file  . Highest education level: Not on file  Social Needs  . Financial resource strain: Not on file  . Food insecurity - worry: Not on file  . Food insecurity - inability: Not on file  . Transportation needs - medical: Not on file  . Transportation needs - non-medical: Not on file  Occupational History  . Occupation: bookkeeper  Tobacco Use  . Smoking status: Never Smoker  . Smokeless tobacco: Never Used  Substance and Sexual Activity  . Alcohol use: No  . Drug use: No  . Sexual activity: Not on file  Other Topics Concern  . Not on file  Social History Narrative   Married '72-widowed '97   HSG   1 daughter '81, 1 son '83: son going thru divorce ['09]; applying to medical school   Work: bookkeeper  [employer]    Current Outpatient Medications on File Prior to Visit  Medication Sig Dispense Refill  . atorvastatin (LIPITOR) 80 MG tablet Take 1 tablet (80 mg total) by mouth daily. 90 tablet 3  . Calcium Carbonate-Vitamin D (CALCIUM + D PO) Take by mouth.    . clindamycin (CLEOCIN T) 1 % external solution Apply 3-4 drops twice weekly to affected ear until return to clinic inf 4 months    . fluticasone (FLONASE) 50 MCG/ACT nasal spray Place 1 spray into both nostrils 2 (two) times daily. 48 g 3  . latanoprost (XALATAN) 0.005 % ophthalmic solution Place 1 drop into both eyes at bedtime.    Marland Kitchen lisinopril-hydrochlorothiazide (PRINZIDE,ZESTORETIC) 20-12.5 MG tablet Take 1 tablet by mouth daily. Keep January appointment for further refills 90 tablet 0  . metroNIDAZOLE (FLAGYL) 250 MG tablet Take 1 tablet (250 mg total) by mouth 3 (three) times daily. 30 tablet 0  . Misc Natural Products (DEEP SLEEP PO) Take 200 mg by mouth once.    . Multiple Vitamins-Minerals (MULTIVITAMIN PO) Take by mouth.    . NON FORMULARY Take 450 mg by mouth 3 (three) times daily between meals.    . Omega-3 Fatty Acids (FISH OIL PO) Take  by mouth.    Marland Kitchen omeprazole (PRILOSEC) 20 MG capsule Take 1 capsule (20 mg total) by mouth daily. 30 capsule 6  . timolol (BETIMOL) 0.5 % ophthalmic solution 1 drop 2 (two) times daily.    . ondansetron (ZOFRAN) 4 MG tablet Take 1 by mouth every 4-6 hours as needed for nausea (Patient not taking: Reported on 12/24/2017) 20 tablet 0  . zolpidem (AMBIEN) 5 MG tablet Take 1 tablet (5 mg total) by mouth at bedtime as needed for sleep. (Patient not taking: Reported on 12/24/2017) 30 tablet 1   No current facility-administered medications on file prior to visit.     No Known Allergies  Family History  Problem Relation Age of Onset  . Other Father 17       deceased, unknown causes in 03-Feb-2023  . Hypertension Mother   . Other Mother 35       SBO, deceased  . Thyroid disease Brother   .  Thyroid disease Sister   . Breast cancer Neg Hx   . Colon cancer Neg Hx   . Esophageal cancer Neg Hx   . Rectal cancer Neg Hx   . Stomach cancer Neg Hx   . Diabetes Neg Hx     BP 122/72   Pulse 83   Ht 5\' 6"  (1.676 m)   Wt 168 lb (76.2 kg)   SpO2 97%   BMI 27.12 kg/m    Review of Systems denies blurry vision, headache, chest pain, sob, n/v, urinary frequency, muscle cramps, excessive diaphoresis, memory loss, depression, cold intolerance, rhinorrhea, and easy bruising.  She has lost 15 lbs x 2 mos, due to her efforts.      Objective:   Physical Exam VS: see vs page GEN: no distress HEAD: head: no deformity eyes: no periorbital swelling, no proptosis.  external nose and ears are normal.   mouth: no lesion seen.   NECK: 2 right thyroid nodules are noted, and 1 on the left.   CHEST WALL: no deformity LUNGS: clear to auscultation CV: reg rate and rhythm, no murmur ABD: abdomen is soft, nontender.  no hepatosplenomegaly.  not distended.  no hernia MUSCULOSKELETAL: muscle bulk and strength are grossly normal.  no obvious joint swelling.  gait is normal and steady EXTEMITIES: no deformity.  no ulcer on the feet.  feet are of normal color and temp.  no edema.  There is bilateral onychomycosis of the toenails.  PULSES: dorsalis pedis intact bilat.  no carotid bruit NEURO:  cn 2-12 grossly intact.   readily moves all 4's.  sensation is intact to touch on the feet.  SKIN:  Normal texture and temperature.  No rash or suspicious lesion is visible.   NODES:  None palpable at the neck.   PSYCH: alert, well-oriented.  Does not appear anxious nor depressed.    Lab Results  Component Value Date   HGBA1C 8.6 (H) 10/19/2017    Lab Results  Component Value Date   CREATININE 0.68 10/19/2017   BUN 11 10/19/2017   NA 135 10/19/2017   K 3.7 10/19/2017   CL 98 10/19/2017   CO2 27 10/19/2017   Lab Results  Component Value Date   TSH 0.29 (L) 12/24/2017   I have reviewed outside  records, and summarized: Pt was noted to have elevated a1c, and referred here.  Pt saw dietician, and with that advice, she lost weight     Assessment & Plan:  Type 2 DM: she needs increased rx, if it  can be done with a regimen that avoids or minimizes hypoglycemia. Hypothyroidism: slightly overreplaced. I have sent a prescription to your pharmacy, to reduce synthroid. Goiter, due for recheck.    Patient Instructions  good diet and exercise significantly improve the control of your diabetes.  please let me know if you wish to be referred to a dietician.  high blood sugar is very risky to your health.  you should see an eye doctor and dentist every year.  It is very important to get all recommended vaccinations.  Controlling your blood pressure and cholesterol drastically reduces the damage diabetes does to your body.  Those who smoke should quit.  Please discuss these with your doctor.  check your blood sugar once a day.  vary the time of day when you check, between before the 3 meals, and at bedtime.  also check if you have symptoms of your blood sugar being too high or too low.  please keep a record of the readings and bring it to your next appointment here (or you can bring the meter itself).  You can write it on any piece of paper.  please call us sooner if your blood sugar goes below 70, or if you have a lot of readings over 200.  blood tests are requested for you today.  We'll let you know about the results. Let's recheck the ultrasound.  you will receive a phone call, about a day and time for an appointment. Please come back for a follow-up appointment in 2 months.

## 2017-12-24 NOTE — Telephone Encounter (Signed)
Patient was seen this morning. Patient only took thyroid medicacation for 1 month-150 mg. Then she went on 137 mg but that dosage was not available at the time (By Dr. Sharlet Salina). Dr. Loanne Drilling wants to up the dosage Metformin which is okay). Now patient is taking 137 mg of Levothyroxine. Please send RX for Metformin to [facility] on [address].

## 2017-12-24 NOTE — Telephone Encounter (Signed)
I contacted the patient and advised the metformin Xl has been changed to 500 mg 4 times per day and the levothyroxine has been changed to 125 mcg. Pateint voiced understanding on changes and had no further questions at this time.

## 2017-12-24 NOTE — Patient Instructions (Signed)
good diet and exercise significantly improve the control of your diabetes.  please let me know if you wish to be referred to a dietician.  high blood sugar is very risky to your health.  you should see an eye doctor and dentist every year.  It is very important to get all recommended vaccinations.  Controlling your blood pressure and cholesterol drastically reduces the damage diabetes does to your body.  Those who smoke should quit.  Please discuss these with your doctor.  check your blood sugar once a day.  vary the time of day when you check, between before the 3 meals, and at bedtime.  also check if you have symptoms of your blood sugar being too high or too low.  please keep a record of the readings and bring it to your next appointment here (or you can bring the meter itself).  You can write it on any piece of paper.  please call us sooner if your blood sugar goes below 70, or if you have a lot of readings over 200.  blood tests are requested for you today.  We'll let you know about the results. Let's recheck the ultrasound.  you will receive a phone call, about a day and time for an appointment. Please come back for a follow-up appointment in 2 months.

## 2017-12-24 NOTE — Telephone Encounter (Signed)
Ok, I have sent a prescription to your pharmacy, to increase the metformin. Based on the blood test, we should reduce the metformin.  I have sent a prescription to your pharmacy, for this, too.   Please come back for a follow-up appointment in 2 months.

## 2017-12-24 NOTE — Telephone Encounter (Signed)
See message and clarify what the dosages are being changed to.

## 2017-12-25 ENCOUNTER — Telehealth: Payer: Self-pay | Admitting: Endocrinology

## 2017-12-25 NOTE — Telephone Encounter (Signed)
I called and clarified with pharmacy that prescription was correct.

## 2017-12-25 NOTE — Telephone Encounter (Signed)
Sonya Brady from [facility] has questions about medication metFORMIN (GLUCOPHAGE-XR) 500 MG 24 hr tablet [[id number]   [place], [place] - [address]

## 2018-01-02 ENCOUNTER — Ambulatory Visit
Admission: RE | Admit: 2018-01-02 | Discharge: 2018-01-02 | Disposition: A | Discharge status: Home / Self Care | Payer: Medicare HMO | Source: Ambulatory Visit | Attending: Endocrinology | Admitting: Endocrinology

## 2018-01-02 DIAGNOSIS — E049 Nontoxic goiter, unspecified: Secondary | ICD-10-CM | POA: Diagnosis not present

## 2018-01-03 ENCOUNTER — Encounter: Payer: Self-pay | Admitting: Endocrinology

## 2018-01-09 ENCOUNTER — Encounter: Payer: Self-pay | Admitting: Internal Medicine

## 2018-01-15 ENCOUNTER — Encounter: Payer: Medicare HMO | Attending: Internal Medicine | Admitting: Registered"

## 2018-01-15 DIAGNOSIS — E1165 Type 2 diabetes mellitus with hyperglycemia: Secondary | ICD-10-CM | POA: Diagnosis not present

## 2018-01-15 DIAGNOSIS — E119 Type 2 diabetes mellitus without complications: Secondary | ICD-10-CM

## 2018-01-15 DIAGNOSIS — Z713 Dietary counseling and surveillance: Secondary | ICD-10-CM | POA: Diagnosis not present

## 2018-01-15 NOTE — Progress Notes (Signed)
Diabetes Self-Management Education  Visit Type: Follow-up  Appt. Start Time: 1700 Appt. End Time: 4008  01/15/2018  [name], identified by name and date of birth, is a 67 y.o. female with a diagnosis of Diabetes:  .   ASSESSMENT Patient states she has started eating more vegetables and consciously adding more greens to meals. Patient reports that her acid reflux has improved a lot and is able to get better sleep than before.   Patient reports that her A1c has come down to 6.6% from 8.6% last visit. Patient states her metformin was increased. Patient reports her doctor wants it to come down a little more but couldn't remember the exact A1c%. Patient states she would like to get it down to normal numbers. Patient was asking how to compare the [place] blood sugar measurement to European. RD does not have that information. After the visit RD remembered that patient stated her son is a doctor and may be providing her advice on what blood sugar number she should have. RD is not sure if son lives in [place].  Patient states sometime she will go hungry because she is afraid to eat and bring up her blood sugar. We discussed hypothetical meals and patient agreed to try including more foods in her diet and gave sample meal that she was thinking about trying with salmon, a little wild rice, and some greens.   Patient reported that her mouth has been dry and uses xylimelts to help relief it.  Diabetes Self-Management Education - 01/15/18 1700      Visit Information   Visit Type  Follow-up      Complications   Last HgB A1C per patient/outside source  6.6 %    How often do you check your blood sugar?  1-2 times/day    Fasting Blood glucose range (mg/dL)  70-129    Postprandial Blood glucose range (mg/dL)  70-129;130-179    Number of hypoglycemic episodes per month  0    Number of hyperglycemic episodes per week  0      Dietary Intake   Breakfast  pastry spinach, beef, 1/2 apple, coffee    Lunch   subway 6" ww, ham, bologna, cheese, kale    Snack (afternoon)  2 grapes, 1/2 apple did not eat apple all at once-nibbled on during day    Dinner  chicken, apple, cheese    Snack (evening)  walnuts    Beverage(s)  water, coffee      Exercise   Exercise Type  Light (walking / raking leaves) 10K steps goal    How many days per week to you exercise?  6    How many minutes per day do you exercise?  45    Total minutes per week of exercise  270      Individualized Goals (developed by patient)   Nutrition  General guidelines for healthy choices and portions discussed    Physical Activity  Exercise 5-7 days per week    Monitoring   test my blood glucose as discussed      Outcomes   Expected Outcomes  Demonstrated interest in learning. Expect positive outcomes    Future DMSE  PRN    Program Status  Completed      Subsequent Visit   Since your last visit have you continued or begun to take your medications as prescribed?  Yes    Since your last visit, are you checking your blood glucose at least once a day?  Yes  Individualized Plan for Diabetes Self-Management Training:   Learning Objective:  Patient will have a greater understanding of diabetes self-management. Patient education plan is to attend individual and/or group sessions per assessed needs and concerns.  Patient Instructions   To have an A1c goal of 6.4% you will probably have target of fasting blood sugar range of about 120-125 mg/dL and 1-2 hours after a meal about 155.  Having a night nighttime snack is fine and may even help to keep morning blood sugar down.  Try Fairlife Milk for more protein with cereal.  Call doctor's office to get more strips and lancets for your meter  Expected Outcomes:  Demonstrated interest in learning. Expect positive outcomes  Education material provided: none  If problems or questions, patient to contact team via:  Phone  Future DSME appointment: PRN

## 2018-01-15 NOTE — Patient Instructions (Addendum)
   To have an A1c goal of 6.4% you will probably have target of fasting blood sugar range of about 120-125 mg/dL and 1-2 hours after a meal about 155.  Having a night nighttime snack is fine and may even help to keep morning blood sugar down.  Try Fairlife Milk for more protein with cereal.  Call doctor's office to get more strips and lancets for your meter

## 2018-01-18 ENCOUNTER — Encounter: Payer: Self-pay | Admitting: Podiatry

## 2018-01-18 ENCOUNTER — Ambulatory Visit: Payer: Medicare HMO | Admitting: Podiatry

## 2018-01-18 VITALS — BP 130/79 | HR 80

## 2018-01-18 DIAGNOSIS — E119 Type 2 diabetes mellitus without complications: Secondary | ICD-10-CM | POA: Diagnosis not present

## 2018-01-18 NOTE — Progress Notes (Signed)
This patient presents to the office with chief complaint of diabetic feet.  This patient  says he is having no pain and discomfort in her feet.  This patient says he has long thick painful nail asymptomatic big toenail left foot.    Marland Kitchen  He has no history of infection or drainage from both feet.  This patient presents the office today for  a foot evaluation due to history of  Diabetes.  She says her Hba1C is 6.8.  History of left ankle fracture.  General Appearance  Alert, conversant and in no acute stress.  Vascular  Dorsalis pedis and posterior tibial  pulses are palpable  bilaterally.  Capillary return is within normal limits  bilaterally. Temperature is within normal limits  bilaterally.  Neurologic  Senn-Weinstein monofilament wire test within normal limits  bilaterally. Muscle power within normal limits bilaterally.  Nails Thick disfigured discolored nails with subungual debris  from hallux left foot.. No evidence of bacterial infection or drainage bilaterally.  Orthopedic  No limitations of motion of motion feet .  No crepitus or effusions noted.  No bony pathology or digital deformities noted. Mild HAV deformity left foot.  Skin  normotropic skin with no porokeratosis noted bilaterally.  No signs of infections or ulcers noted.       Diabetes with no foot complications  IE   A diabetic foot exam was performed and there is no evidence of any vascular or neurologic pathology.   RTC 12  months.   Gardiner Barefoot DPM

## 2018-02-04 ENCOUNTER — Other Ambulatory Visit: Payer: Self-pay | Admitting: Internal Medicine

## 2018-02-18 DIAGNOSIS — Z1231 Encounter for screening mammogram for malignant neoplasm of breast: Secondary | ICD-10-CM | POA: Diagnosis not present

## 2018-02-18 DIAGNOSIS — N816 Rectocele: Secondary | ICD-10-CM | POA: Diagnosis not present

## 2018-02-18 DIAGNOSIS — Z01419 Encounter for gynecological examination (general) (routine) without abnormal findings: Secondary | ICD-10-CM | POA: Diagnosis not present

## 2018-02-18 DIAGNOSIS — N8110 Cystocele, unspecified: Secondary | ICD-10-CM | POA: Diagnosis not present

## 2018-02-18 DIAGNOSIS — E119 Type 2 diabetes mellitus without complications: Secondary | ICD-10-CM | POA: Diagnosis not present

## 2018-02-22 ENCOUNTER — Ambulatory Visit: Payer: Medicare HMO | Admitting: Endocrinology

## 2018-02-22 ENCOUNTER — Encounter: Payer: Self-pay | Admitting: Endocrinology

## 2018-02-22 VITALS — BP 122/76 | HR 82 | Wt 161.4 lb

## 2018-02-22 DIAGNOSIS — E1165 Type 2 diabetes mellitus with hyperglycemia: Secondary | ICD-10-CM | POA: Diagnosis not present

## 2018-02-22 DIAGNOSIS — E039 Hypothyroidism, unspecified: Secondary | ICD-10-CM | POA: Diagnosis not present

## 2018-02-22 LAB — POCT GLYCOSYLATED HEMOGLOBIN (HGB A1C): HEMOGLOBIN A1C: 5.4

## 2018-02-22 LAB — TSH: TSH: 0.12 u[IU]/mL — ABNORMAL LOW (ref 0.35–4.50)

## 2018-02-22 MED ORDER — LEVOTHYROXINE SODIUM 100 MCG PO TABS: 100.0000 ug | ORAL_TABLET | Freq: Every day | ORAL | 5 refills | Status: DC

## 2018-02-22 MED ORDER — LEVOTHYROXINE SODIUM 125 MCG PO TABS: 125.0000 ug | ORAL_TABLET | Freq: Every day | ORAL | 3 refills | Status: DC

## 2018-02-22 NOTE — Patient Instructions (Addendum)
check your blood sugar once a day.  vary the time of day when you check, between before the 3 meals, and at bedtime.  also check if you have symptoms of your blood sugar being too high or too low.  please keep a record of the readings and bring it to your next appointment here (or you can bring the meter itself).  You can write it on any piece of paper.  please call us sooner if your blood sugar goes below 70, or if you have a lot of readings over 200.  blood tests are requested for you today.  We'll let you know about the results.  Please continue the same metformin. Please come back for a follow-up appointment in 4 months.

## 2018-02-22 NOTE — Progress Notes (Signed)
Subjective:    Patient ID: Sonya Brady, female    DOB: [birth year], 67 y.o.   MRN: [id number]  HPI Pt returns for f/u of diabetes mellitus: DM type: 2 Dx'ed: 1287 Complications: none Therapy: metformin. GDM: never DKA: never Severe hypoglycemia: never Pancreatitis: never Pancreatic imaging:  Other: pt states she feels well in general, except for fatigue.  She says cbg's are well-controlled.   She takes synthroid 137 mcg/d, for hypothyroidism. US showed heterogeneous enlarged thyroid compatible with goiter.  Past Medical History:  Diagnosis Date  . Acne   . Allergy   . Breast mass, right   . Cataract   . Cholelithiasis   . Depression    situational when husband was sick and dying.  . Diverticulosis   . GERD (gastroesophageal reflux disease)   . H. pylori infection   . Hypercholesteremia   . Hypertension   . Hypothyroidism     Past Surgical History:  Procedure Laterality Date  . ABDOMINAL HYSTERECTOMY     both ovary intact  . BREAST LUMPECTOMY Bilateral   . carpel tunnel surgery Right   . TYMPANOPLASTY     tube left ear    Social History   Socioeconomic History  . Marital status: Single    Spouse name: Not on file  . Number of children: Not on file  . Years of education: Not on file  . Highest education level: Not on file  Occupational History  . Occupation: bookkeeper  Scientific laboratory technician  . Financial resource strain: Not on file  . Food insecurity:    Worry: Not on file    Inability: Not on file  . Transportation needs:    Medical: Not on file    Non-medical: Not on file  Tobacco Use  . Smoking status: Never Smoker  . Smokeless tobacco: Never Used  Substance and Sexual Activity  . Alcohol use: No  . Drug use: No  . Sexual activity: Not on file  Lifestyle  . Physical activity:    Days per week: Not on file    Minutes per session: Not on file  . Stress: Not on file  Relationships  . Social connections:    Talks on phone: Not on file    Gets  together: Not on file    Attends religious service: Not on file    Active member of club or organization: Not on file    Attends meetings of clubs or organizations: Not on file    Relationship status: Not on file  . Intimate partner violence:    Fear of current or ex partner: Not on file    Emotionally abused: Not on file    Physically abused: Not on file    Forced sexual activity: Not on file  Other Topics Concern  . Not on file  Social History Narrative   Married '72-widowed '97   HSG   1 daughter '81, 1 son '83: son going thru divorce ['09]; applying to medical school   Work: bookkeeper [employer]    Current Outpatient Medications on File Prior to Visit  Medication Sig Dispense Refill  . atorvastatin (LIPITOR) 80 MG tablet Take 1 tablet (80 mg total) by mouth daily. 90 tablet 3  . Calcium Carbonate-Vitamin D (CALCIUM + D PO) Take by mouth.    . fluticasone (FLONASE) 50 MCG/ACT nasal spray Place 1 spray into both nostrils 2 (two) times daily. 48 g 3  . latanoprost (XALATAN) 0.005 % ophthalmic solution Place 1 drop  into both eyes at bedtime.    Marland Kitchen lisinopril-hydrochlorothiazide (PRINZIDE,ZESTORETIC) 20-12.5 MG tablet TAKE 1 TABLET BY MOUTH DAILY 90 tablet 1  . metFORMIN (GLUCOPHAGE-XR) 500 MG 24 hr tablet Take 4 tablets (2,000 mg total) by mouth daily with breakfast. 360 tablet 3  . Misc Natural Products (DEEP SLEEP PO) Take 200 mg by mouth once.    . Multiple Vitamins-Minerals (MULTIVITAMIN PO) Take by mouth.    . NON FORMULARY Take 450 mg by mouth 3 (three) times daily between meals.    . Omega-3 Fatty Acids (FISH OIL PO) Take by mouth.    Marland Kitchen omeprazole (PRILOSEC) 20 MG capsule Take 1 capsule (20 mg total) by mouth daily. 30 capsule 6  . ondansetron (ZOFRAN) 4 MG tablet Take 1 by mouth every 4-6 hours as needed for nausea 20 tablet 0  . timolol (BETIMOL) 0.5 % ophthalmic solution 1 drop 2 (two) times daily.    Marland Kitchen zolpidem (AMBIEN) 5 MG tablet Take 1 tablet (5 mg total) by mouth  at bedtime as needed for sleep. 30 tablet 1   No current facility-administered medications on file prior to visit.     Allergies  Allergen Reactions  . No Known Allergies     Family History  Problem Relation Age of Onset  . Other Father 26       deceased, unknown causes in 17-Jan-2023  . Hypertension Mother   . Other Mother 48       SBO, deceased  . Thyroid disease Brother   . Thyroid disease Sister   . Breast cancer Neg Hx   . Colon cancer Neg Hx   . Esophageal cancer Neg Hx   . Rectal cancer Neg Hx   . Stomach cancer Neg Hx   . Diabetes Neg Hx     BP 122/76   Pulse 82   Wt 161 lb 6.4 oz (73.2 kg)   SpO2 97%   BMI 26.05 kg/m    Review of Systems She denies hypoglycemia    Objective:   Physical Exam VITAL SIGNS:  See vs page GENERAL: no distress Pulses: dorsalis pedis intact bilat.   MSK: no deformity of the feet CV: no leg edema Skin:  no ulcer on the feet.  normal color and temp on the feet. Neuro: sensation is intact to touch on the feet Ext: There is bilateral onychomycosis of the toenails.    A1c=5.4%  Lab Results  Component Value Date   TSH 0.12 (L) 02/22/2018      Assessment & Plan:  Type 2 DM: well-controlled Hypothyroidism: overreplaced.  I have sent a prescription to your pharmacy, to reduce synthroid.  Patient Instructions  check your blood sugar once a day.  vary the time of day when you check, between before the 3 meals, and at bedtime.  also check if you have symptoms of your blood sugar being too high or too low.  please keep a record of the readings and bring it to your next appointment here (or you can bring the meter itself).  You can write it on any piece of paper.  please call us sooner if your blood sugar goes below 70, or if you have a lot of readings over 200.  blood tests are requested for you today.  We'll let you know about the results.  Please continue the same metformin. Please come back for a follow-up appointment in 4 months.

## 2018-03-07 ENCOUNTER — Telehealth: Payer: Self-pay | Admitting: Endocrinology

## 2018-03-07 NOTE — Telephone Encounter (Signed)
Patient needs prescription sent in for test strips and needles.  [facility])     [place] [address] [place] - [address]

## 2018-03-08 ENCOUNTER — Other Ambulatory Visit: Payer: Self-pay

## 2018-03-11 ENCOUNTER — Telehealth: Payer: Self-pay | Admitting: Endocrinology

## 2018-03-11 NOTE — Telephone Encounter (Signed)
Patient needs prescription sent for the ONE TOUCH test strips and needles sent into her [place], [place] - [address]

## 2018-03-12 ENCOUNTER — Other Ambulatory Visit: Payer: Self-pay

## 2018-03-12 DIAGNOSIS — R69 Illness, unspecified: Secondary | ICD-10-CM | POA: Diagnosis not present

## 2018-03-12 MED ORDER — GLUCOSE BLOOD VI STRP: ORAL_STRIP | 1 refills | Status: DC

## 2018-03-12 MED ORDER — ONETOUCH DELICA LANCETS FINE MISC: 1 refills | Status: DC

## 2018-03-12 NOTE — Telephone Encounter (Signed)
Done

## 2018-03-12 NOTE — Telephone Encounter (Signed)
Refill sent to pharmacy.   

## 2018-03-18 ENCOUNTER — Telehealth: Payer: Self-pay | Admitting: Endocrinology

## 2018-03-18 NOTE — Telephone Encounter (Signed)
Patient needs an RX for the correct testing strips for the One Touch Verio Flex Meter sent to [facility] on [address] (the test strips she has are not correct for the above meter).

## 2018-03-19 ENCOUNTER — Other Ambulatory Visit: Payer: Self-pay

## 2018-03-19 MED ORDER — GLUCOSE BLOOD VI STRP: ORAL_STRIP | 12 refills | Status: DC

## 2018-03-19 NOTE — Telephone Encounter (Signed)
I have correct & sent to patient's pharmacy.

## 2018-03-20 ENCOUNTER — Telehealth: Payer: Self-pay | Admitting: Endocrinology

## 2018-03-20 ENCOUNTER — Other Ambulatory Visit: Payer: Self-pay

## 2018-03-20 DIAGNOSIS — R69 Illness, unspecified: Secondary | ICD-10-CM | POA: Diagnosis not present

## 2018-03-20 NOTE — Telephone Encounter (Signed)
error 

## 2018-03-20 NOTE — Telephone Encounter (Signed)
Patient called back & just wanted to make sure that she should get prescription of 100 mcg levothyroxine renewed. I stated that she should until her next visit in September when labs would be rechecked.

## 2018-03-20 NOTE — Telephone Encounter (Signed)
Patient ask you to gie her a call about the dosage of her thyroid medicine, she did not know the name of it.

## 2018-03-20 NOTE — Telephone Encounter (Signed)
LVM for patient to call back to discuss medication.

## 2018-03-20 NOTE — Telephone Encounter (Signed)
levothyroxine (SYNTHROID, LEVOTHROID) 100 MCG tablet   Patient would like a call back to discuss medication and dosage,  Please advise

## 2018-05-20 DIAGNOSIS — H7312 Chronic myringitis, left ear: Secondary | ICD-10-CM | POA: Diagnosis not present

## 2018-05-29 ENCOUNTER — Other Ambulatory Visit: Payer: Self-pay | Admitting: Internal Medicine

## 2018-05-31 DIAGNOSIS — R69 Illness, unspecified: Secondary | ICD-10-CM | POA: Diagnosis not present

## 2018-06-07 DIAGNOSIS — R69 Illness, unspecified: Secondary | ICD-10-CM | POA: Diagnosis not present

## 2018-06-25 ENCOUNTER — Encounter: Payer: Self-pay | Admitting: Endocrinology

## 2018-06-25 ENCOUNTER — Ambulatory Visit: Payer: Medicare HMO | Admitting: Endocrinology

## 2018-06-25 VITALS — BP 122/70 | HR 76 | Ht 66.0 in | Wt 161.2 lb

## 2018-06-25 DIAGNOSIS — E119 Type 2 diabetes mellitus without complications: Secondary | ICD-10-CM

## 2018-06-25 DIAGNOSIS — E039 Hypothyroidism, unspecified: Secondary | ICD-10-CM

## 2018-06-25 LAB — TSH: TSH: 2.68 u[IU]/mL (ref 0.35–4.50)

## 2018-06-25 LAB — T4, FREE: FREE T4: 1.01 ng/dL (ref 0.60–1.60)

## 2018-06-25 LAB — POCT GLYCOSYLATED HEMOGLOBIN (HGB A1C): Hemoglobin A1C: 5.3 % (ref 4.0–5.6)

## 2018-06-25 NOTE — Patient Instructions (Addendum)
check your blood sugar once a day.  vary the time of day when you check, between before the 3 meals, and at bedtime.  also check if you have symptoms of your blood sugar being too high or too low.  please keep a record of the readings and bring it to your next appointment here (or you can bring the meter itself).  You can write it on any piece of paper.  please call us sooner if your blood sugar goes below 70, or if you have a lot of readings over 200.  Thyroid blood tests are requested for you today.  We'll let you know about the results. We'll plan to recheck the ultrasound next year.   Please continue the same metformin. Please come back for a follow-up appointment in 4 months.

## 2018-06-25 NOTE — Progress Notes (Signed)
Subjective:    Patient ID: Sonya Brady, female    DOB: 1951/07/17, 67 y.o.   MRN: [id number]  HPI Pt returns for f/u of diabetes mellitus: DM type: 2 Dx'ed: 4917 Complications: none Therapy: metformin. GDM: never DKA: never Severe hypoglycemia: never Pancreatitis: never Pancreatic imaging: normal on 2019 CT Other: pt states she feels well in general.  She says cbg's are well-controlled.   She takes synthroid 137 mcg/d, for hypothyroidism. US showed heterogeneous enlarged thyroid compatible with small goiter--bx not needed.  She does not notice the goiter Past Medical History:  Diagnosis Date  . Acne   . Allergy   . Breast mass, right   . Cataract   . Cholelithiasis   . Depression    situational when husband was sick and dying.  . Diverticulosis   . GERD (gastroesophageal reflux disease)   . H. pylori infection   . Hypercholesteremia   . Hypertension   . Hypothyroidism     Past Surgical History:  Procedure Laterality Date  . ABDOMINAL HYSTERECTOMY     both ovary intact  . BREAST LUMPECTOMY Bilateral   . carpel tunnel surgery Right   . TYMPANOPLASTY     tube left ear    Social History   Socioeconomic History  . Marital status: Single    Spouse name: Not on file  . Number of children: Not on file  . Years of education: Not on file  . Highest education level: Not on file  Occupational History  . Occupation: bookkeeper  Scientific laboratory technician  . Financial resource strain: Not on file  . Food insecurity:    Worry: Not on file    Inability: Not on file  . Transportation needs:    Medical: Not on file    Non-medical: Not on file  Tobacco Use  . Smoking status: Never Smoker  . Smokeless tobacco: Never Used  Substance and Sexual Activity  . Alcohol use: No  . Drug use: No  . Sexual activity: Not on file  Lifestyle  . Physical activity:    Days per week: Not on file    Minutes per session: Not on file  . Stress: Not on file  Relationships  . Social  connections:    Talks on phone: Not on file    Gets together: Not on file    Attends religious service: Not on file    Active member of club or organization: Not on file    Attends meetings of clubs or organizations: Not on file    Relationship status: Not on file  . Intimate partner violence:    Fear of current or ex partner: Not on file    Emotionally abused: Not on file    Physically abused: Not on file    Forced sexual activity: Not on file  Other Topics Concern  . Not on file  Social History Narrative   Married '72-widowed '97   HSG   1 daughter '81, 1 son '83: son going thru divorce ['09]; applying to medical school   Work: bookkeeper [employer]    Current Outpatient Medications on File Prior to Visit  Medication Sig Dispense Refill  . atorvastatin (LIPITOR) 80 MG tablet Take 1 tablet (80 mg total) by mouth daily. 90 tablet 3  . Calcium Carbonate-Vitamin D (CALCIUM + D PO) Take by mouth.    . fluticasone (FLONASE) 50 MCG/ACT nasal spray Place 1 spray into both nostrils 2 (two) times daily. 48 g 3  .  glucose blood (ONETOUCH VERIO) test strip Used to check blood sugars one time daily. 100 each 12  . latanoprost (XALATAN) 0.005 % ophthalmic solution Place 1 drop into both eyes at bedtime.    Marland Kitchen lisinopril-hydrochlorothiazide (PRINZIDE,ZESTORETIC) 20-12.5 MG tablet TAKE 1 TABLET BY MOUTH DAILY 90 tablet 1  . metFORMIN (GLUCOPHAGE-XR) 500 MG 24 hr tablet Take 4 tablets (2,000 mg total) by mouth daily with breakfast. 360 tablet 3  . Misc Natural Products (DEEP SLEEP PO) Take 200 mg by mouth once.    . Multiple Vitamins-Minerals (MULTIVITAMIN PO) Take by mouth.    . NON FORMULARY Take 450 mg by mouth 3 (three) times daily between meals.    . Omega-3 Fatty Acids (FISH OIL PO) Take by mouth.    Marland Kitchen omeprazole (PRILOSEC) 20 MG capsule TAKE 1 CAPSULE BY MOUTH ONCE DAILY 90 capsule 1  . ONETOUCH DELICA LANCETS FINE MISC Use to check sugars once daily 100 each 1  . timolol (BETIMOL) 0.5  % ophthalmic solution 1 drop 2 (two) times daily.     No current facility-administered medications on file prior to visit.     Allergies  Allergen Reactions  . No Known Allergies     Family History  Problem Relation Age of Onset  . Other Father 37       deceased, unknown causes in 01/12/23  . Hypertension Mother   . Other Mother 29       SBO, deceased  . Thyroid disease Brother   . Thyroid disease Sister   . Breast cancer Neg Hx   . Colon cancer Neg Hx   . Esophageal cancer Neg Hx   . Rectal cancer Neg Hx   . Stomach cancer Neg Hx   . Diabetes Neg Hx     BP 122/70   Pulse 76   Ht 5\' 6"  (1.676 m)   Wt 161 lb 3.2 oz (73.1 kg)   SpO2 98%   BMI 26.02 kg/m    Review of Systems denies neck pain    Objective:   Physical Exam VITAL SIGNS:  See vs page GENERAL: no distress NECK: 2 cm right thyroid nodule.  Pulses: dorsalis pedis intact bilat.   MSK: no deformity of the feet CV: no leg edema Skin:  no ulcer on the feet.  normal color and temp on the feet. Neuro: sensation is intact to touch on the feet.   Ext: There is bilateral onychomycosis of the toenails.    A1c=5.3%     Assessment & Plan:  Thyroid pseudonodule: Clinically stable Type 2 DM: well-controlled Hypothyroidism: recheck today  Patient Instructions  check your blood sugar once a day.  vary the time of day when you check, between before the 3 meals, and at bedtime.  also check if you have symptoms of your blood sugar being too high or too low.  please keep a record of the readings and bring it to your next appointment here (or you can bring the meter itself).  You can write it on any piece of paper.  please call us sooner if your blood sugar goes below 70, or if you have a lot of readings over 200.  Thyroid blood tests are requested for you today.  We'll let you know about the results. We'll plan to recheck the ultrasound next year.   Please continue the same metformin. Please come back for a follow-up  appointment in 4 months.

## 2018-06-27 ENCOUNTER — Other Ambulatory Visit: Payer: Self-pay

## 2018-06-27 MED ORDER — LEVOTHYROXINE SODIUM 100 MCG PO TABS: 100.0000 ug | ORAL_TABLET | Freq: Every day | ORAL | 0 refills | Status: DC

## 2018-07-15 ENCOUNTER — Ambulatory Visit (INDEPENDENT_AMBULATORY_CARE_PROVIDER_SITE_OTHER): Payer: Medicare HMO | Admitting: *Deleted

## 2018-07-15 DIAGNOSIS — Z23 Encounter for immunization: Secondary | ICD-10-CM | POA: Diagnosis not present

## 2018-07-22 DIAGNOSIS — R69 Illness, unspecified: Secondary | ICD-10-CM | POA: Diagnosis not present

## 2018-07-29 ENCOUNTER — Other Ambulatory Visit: Payer: Self-pay | Admitting: Internal Medicine

## 2018-08-14 DIAGNOSIS — R69 Illness, unspecified: Secondary | ICD-10-CM | POA: Diagnosis not present

## 2018-09-03 DIAGNOSIS — H04123 Dry eye syndrome of bilateral lacrimal glands: Secondary | ICD-10-CM | POA: Diagnosis not present

## 2018-09-03 DIAGNOSIS — H40013 Open angle with borderline findings, low risk, bilateral: Secondary | ICD-10-CM | POA: Diagnosis not present

## 2018-09-03 DIAGNOSIS — E119 Type 2 diabetes mellitus without complications: Secondary | ICD-10-CM | POA: Diagnosis not present

## 2018-09-03 DIAGNOSIS — H43813 Vitreous degeneration, bilateral: Secondary | ICD-10-CM | POA: Diagnosis not present

## 2018-09-03 LAB — HM DIABETES EYE EXAM

## 2018-10-09 DIAGNOSIS — Z8616 Personal history of COVID-19: Secondary | ICD-10-CM

## 2018-10-09 HISTORY — DX: Personal history of COVID-19: Z86.16

## 2018-10-24 ENCOUNTER — Encounter: Payer: Self-pay | Admitting: Physician Assistant

## 2018-10-24 ENCOUNTER — Ambulatory Visit: Payer: Medicare HMO | Admitting: Physician Assistant

## 2018-10-24 VITALS — BP 110/70 | HR 84 | Ht 66.0 in | Wt 161.0 lb

## 2018-10-24 DIAGNOSIS — R194 Change in bowel habit: Secondary | ICD-10-CM

## 2018-10-24 DIAGNOSIS — R1032 Left lower quadrant pain: Secondary | ICD-10-CM | POA: Diagnosis not present

## 2018-10-24 MED ORDER — METRONIDAZOLE 250 MG PO TABS: ORAL_TABLET | ORAL | 0 refills | Status: DC

## 2018-10-24 MED ORDER — LEVOFLOXACIN 250 MG PO TABS: ORAL_TABLET | ORAL | 0 refills | Status: DC

## 2018-10-24 NOTE — Patient Instructions (Addendum)
We sent prescriptions to [address] [place], [place] 1. Levaquin 250 mg.  2. Flagyl ( metronidazole) 250 mg.   Follow up with [place] as needed.  Normal BMI (Body Mass Index- based on height and weight) is between 23 and 30. Your BMI today is Body mass index is 25.99 kg/m. Marland Kitchen Please consider follow up  regarding your BMI with your Primary Care Provider.

## 2018-10-24 NOTE — Progress Notes (Signed)
Chief Complaint: Abdominal pain, change in bowel habits  HPI:    Sonya Brady is a 68 year old female with a past medical history as listed below, known to Dr. Henrene Pastor, who presents to clinic today with abdominal pain and change in bowel habits.    11/13/2017 office visit with me for suspected diverticulitis.  CT at that time did show sigmoid diverticulitis.  Patient was given Levaquin 250 mg twice daily x10 days and Flagyl 250 mg 3 times daily x10 days.  Patient described that previously antibiotics were too strong for her and this was a good regimen.  This seemed to clear her symptoms.    Today, patient tells me the night before last she got up to go to the bathroom and started with excruciating abdominal pain which felt like "someone was sticking a screwdriver in there", rated as 8-9/10 which kept her awake that night.  Started taking Ibuprofen and Tylenol off-and-on but does describe breaking out in a sweat due to this pain.  It continued yesterday throughout the day mainly in her left lower quadrant, but does radiate around her abdomen and makes her feel like she is "blowed up like a balloon".  Tells me she did have the urge to go to the bathroom but would only pass air.  Did finally have a normal-looking stool today.  Blames all of this on eating some cranberries and thinks "the seeds got stuck in there".  Does discuss she had diarrhea of the week leading up to this.  Thinks this was after eating [place] food.    Denies weight loss or blood in her stool.  Past Medical History:  Diagnosis Date  . Acne   . Allergy   . Breast mass, right   . Cataract   . Cholelithiasis   . Depression    situational when husband was sick and dying.  . Diverticulosis   . GERD (gastroesophageal reflux disease)   . H. pylori infection   . Hypercholesteremia   . Hypertension   . Hypothyroidism     Past Surgical History:  Procedure Laterality Date  . ABDOMINAL HYSTERECTOMY     both ovary intact  . BREAST  LUMPECTOMY Bilateral   . carpel tunnel surgery Right   . TYMPANOPLASTY     tube left ear    Current Outpatient Medications  Medication Sig Dispense Refill  . atorvastatin (LIPITOR) 80 MG tablet Take 1 tablet (80 mg total) by mouth daily. 90 tablet 3  . Calcium Carbonate-Vitamin D (CALCIUM + D PO) Take by mouth.    Marland Kitchen glucose blood (ONETOUCH VERIO) test strip Used to check blood sugars one time daily. 100 each 12  . latanoprost (XALATAN) 0.005 % ophthalmic solution Place 1 drop into both eyes at bedtime.    Marland Kitchen levothyroxine (SYNTHROID, LEVOTHROID) 100 MCG tablet Take 1 tablet (100 mcg total) by mouth daily before breakfast. 90 tablet 0  . lisinopril-hydrochlorothiazide (PRINZIDE,ZESTORETIC) 20-12.5 MG tablet TAKE 1 TABLET BY MOUTH DAILY 90 tablet 1  . metFORMIN (GLUCOPHAGE-XR) 500 MG 24 hr tablet Take 4 tablets (2,000 mg total) by mouth daily with breakfast. 360 tablet 3  . Misc Natural Products (DEEP SLEEP PO) Take 200 mg by mouth once.    . Multiple Vitamins-Minerals (MULTIVITAMIN PO) Take by mouth.    . Omega-3 Fatty Acids (FISH OIL PO) Take by mouth.    Marland Kitchen omeprazole (PRILOSEC) 20 MG capsule TAKE 1 CAPSULE BY MOUTH ONCE DAILY 90 capsule 1  . timolol (BETIMOL) 0.5 % ophthalmic  solution 1 drop 2 (two) times daily.     No current facility-administered medications for this visit.     Allergies as of 10/24/2018 - Review Complete 10/24/2018  Allergen Reaction Noted  . No known allergies  07/08/2015    Family History  Problem Relation Age of Onset  . Other Father 31       deceased, unknown causes in 01/22/23  . Hypertension Mother   . Other Mother 24       SBO, deceased  . Thyroid disease Brother   . Thyroid disease Sister   . Breast cancer Neg Hx   . Colon cancer Neg Hx   . Esophageal cancer Neg Hx   . Rectal cancer Neg Hx   . Stomach cancer Neg Hx   . Diabetes Neg Hx     Social History   Socioeconomic History  . Marital status: Single    Spouse name: Not on file  . Number of  children: 2  . Years of education: Not on file  . Highest education level: Not on file  Occupational History  . Occupation: bookkeeper  Scientific laboratory technician  . Financial resource strain: Not on file  . Food insecurity:    Worry: Not on file    Inability: Not on file  . Transportation needs:    Medical: Not on file    Non-medical: Not on file  Tobacco Use  . Smoking status: Never Smoker  . Smokeless tobacco: Never Used  Substance and Sexual Activity  . Alcohol use: No  . Drug use: No  . Sexual activity: Not on file  Lifestyle  . Physical activity:    Days per week: Not on file    Minutes per session: Not on file  . Stress: Not on file  Relationships  . Social connections:    Talks on phone: Not on file    Gets together: Not on file    Attends religious service: Not on file    Active member of club or organization: Not on file    Attends meetings of clubs or organizations: Not on file    Relationship status: Not on file  . Intimate partner violence:    Fear of current or ex partner: Not on file    Emotionally abused: Not on file    Physically abused: Not on file    Forced sexual activity: Not on file  Other Topics Concern  . Not on file  Social History Narrative   Married '72-widowed '97   HSG   1 daughter 01/22/1980, 1 son 01/21/82: son going thru divorce ['09]; applying to medical school   Work: bookkeeper [employer]    Review of Systems:    Constitutional: No weight loss, fever or chills Cardiovascular: No chest pain Respiratory: No SOB  Gastrointestinal: See HPI and otherwise negative   Physical Exam:  Vital signs: BP 110/70   Pulse 84   Ht 5\' 6"  (1.676 m)   Wt 161 lb (73 kg)   BMI 25.99 kg/m   Constitutional:   Pleasant  female appears to be in NAD, Well developed, Well nourished, alert and cooperative Respiratory: Respirations even and unlabored. Lungs clear to auscultation bilaterally.   No wheezes, crackles, or rhonchi.  Cardiovascular: Normal S1, S2. No MRG.  Regular rate and rhythm. No peripheral edema, cyanosis or pallor.  Gastrointestinal:  Soft, nondistended,moderate LLQ ttp, mild generalized ttp, No rebound or guarding. Normal bowel sounds. No appreciable masses or hepatomegaly. Psychiatric: Demonstrates good judgement and reason  without abnormal affect or behaviors.  No recent labs.  Assessment: 1.  Left lower quadrant abdominal pain: Started 2 days ago, history of diverticulitis, likely the same 2.  Change in bowel habits: Initially diarrhea, then some constipation with above  Plan: 1.  Prescribed Levaquin 250 mg twice daily x10 days 2.  Prescribed Flagyl 250 mg 3 times daily x10 days. 3.  Encouraged the patient to maintain a low fiber/ low residue diet for now until she finishes antibiotics 4.  Patient will follow with [place] if she has continued symptoms after antibiotics above.  Ellouise Newer, PA-C [facility] 10/24/2018, 9:51 AM  Cc: Hoyt Koch, *

## 2018-10-24 NOTE — Progress Notes (Signed)
Assessment and plan reviewed 

## 2018-10-25 ENCOUNTER — Encounter: Payer: Self-pay | Admitting: Endocrinology

## 2018-10-25 ENCOUNTER — Ambulatory Visit: Payer: Medicare HMO | Admitting: Endocrinology

## 2018-10-25 VITALS — BP 134/82 | HR 80 | Ht 66.0 in | Wt 158.4 lb

## 2018-10-25 DIAGNOSIS — E119 Type 2 diabetes mellitus without complications: Secondary | ICD-10-CM

## 2018-10-25 LAB — POCT GLYCOSYLATED HEMOGLOBIN (HGB A1C): HEMOGLOBIN A1C: 5.6 % (ref 4.0–5.6)

## 2018-10-25 NOTE — Patient Instructions (Addendum)
check your blood sugar once a day.  vary the time of day when you check, between before the 3 meals, and at bedtime.  also check if you have symptoms of your blood sugar being too high or too low.  please keep a record of the readings and bring it to your next appointment here (or you can bring the meter itself).  You can write it on any piece of paper.  please call us sooner if your blood sugar goes below 70, or if you have a lot of readings over 200.  We'll plan to recheck the thyroid ultrasound next time.   Please continue the same metformin. Please come back for a follow-up appointment in 4 months.

## 2018-10-25 NOTE — Progress Notes (Signed)
Subjective:    Patient ID: Sonya Brady, female    DOB: [birth year], 68 y.o.   MRN: [id number]  HPI Pt returns for f/u of diabetes mellitus: DM type: 2 Dx'ed: 4332 Complications: none Therapy: metformin. GDM: never DKA: never Severe hypoglycemia: never Pancreatitis: never Pancreatic imaging: normal on 2019 CT Other: pt states she feels well in general.  She says cbg's are well-controlled.   She takes synthroid 137 mcg/d, for hypothyroidism. US showed heterogeneous enlarged thyroid compatible with small goiter--bx not needed.  She does not notice the goiter. Past Medical History:  Diagnosis Date  . Acne   . Allergy   . Breast mass, right   . Cataract   . Cholelithiasis   . Depression    situational when husband was sick and dying.  . Diverticulosis   . GERD (gastroesophageal reflux disease)   . H. pylori infection   . Hypercholesteremia   . Hypertension   . Hypothyroidism     Past Surgical History:  Procedure Laterality Date  . ABDOMINAL HYSTERECTOMY     both ovary intact  . BREAST LUMPECTOMY Bilateral   . carpel tunnel surgery Right   . TYMPANOPLASTY     tube left ear    Social History   Socioeconomic History  . Marital status: Single    Spouse name: Not on file  . Number of children: 2  . Years of education: Not on file  . Highest education level: Not on file  Occupational History  . Occupation: bookkeeper  Scientific laboratory technician  . Financial resource strain: Not on file  . Food insecurity:    Worry: Not on file    Inability: Not on file  . Transportation needs:    Medical: Not on file    Non-medical: Not on file  Tobacco Use  . Smoking status: Never Smoker  . Smokeless tobacco: Never Used  Substance and Sexual Activity  . Alcohol use: No  . Drug use: No  . Sexual activity: Not on file  Lifestyle  . Physical activity:    Days per week: Not on file    Minutes per session: Not on file  . Stress: Not on file  Relationships  . Social connections:   Talks on phone: Not on file    Gets together: Not on file    Attends religious service: Not on file    Active member of club or organization: Not on file    Attends meetings of clubs or organizations: Not on file    Relationship status: Not on file  . Intimate partner violence:    Fear of current or ex partner: Not on file    Emotionally abused: Not on file    Physically abused: Not on file    Forced sexual activity: Not on file  Other Topics Concern  . Not on file  Social History Narrative   Married '72-widowed '97   HSG   1 daughter '81, 1 son '83: son going thru divorce ['09]; applying to medical school   Work: bookkeeper [employer]    Current Outpatient Medications on File Prior to Visit  Medication Sig Dispense Refill  . atorvastatin (LIPITOR) 80 MG tablet Take 1 tablet (80 mg total) by mouth daily. 90 tablet 3  . Calcium Carbonate-Vitamin D (CALCIUM + D PO) Take by mouth.    Marland Kitchen glucose blood (ONETOUCH VERIO) test strip Used to check blood sugars one time daily. 100 each 12  . latanoprost (XALATAN) 0.005 % ophthalmic solution  Place 1 drop into both eyes at bedtime.    Marland Kitchen levofloxacin (LEVAQUIN) 250 MG tablet Take 250 mg by mouth daily. Take 1 tablet by mouth BID x10 days    . levothyroxine (SYNTHROID, LEVOTHROID) 100 MCG tablet Take 1 tablet (100 mcg total) by mouth daily before breakfast. 90 tablet 0  . lisinopril-hydrochlorothiazide (PRINZIDE,ZESTORETIC) 20-12.5 MG tablet TAKE 1 TABLET BY MOUTH DAILY 90 tablet 1  . metFORMIN (GLUCOPHAGE-XR) 500 MG 24 hr tablet Take 4 tablets (2,000 mg total) by mouth daily with breakfast. (Patient taking differently: Take 2,000 mg by mouth daily with breakfast. Take 2 tablets in the morning and 2 tablets in the evening) 360 tablet 3  . metroNIDAZOLE (FLAGYL) 250 MG tablet Take 1 tablet by mouth 3 times daily for 10 days. 30 tablet 0  . Misc Natural Products (DEEP SLEEP PO) Take 200 mg by mouth once.    . Multiple Vitamins-Minerals  (MULTIVITAMIN PO) Take by mouth.    . Omega-3 Fatty Acids (FISH OIL PO) Take by mouth.    Marland Kitchen omeprazole (PRILOSEC) 20 MG capsule TAKE 1 CAPSULE BY MOUTH ONCE DAILY 90 capsule 1  . timolol (BETIMOL) 0.5 % ophthalmic solution 1 drop 2 (two) times daily.     No current facility-administered medications on file prior to visit.     Allergies  Allergen Reactions  . No Known Allergies     Family History  Problem Relation Age of Onset  . Other Father 14       deceased, unknown causes in 01/13/2023  . Hypertension Mother   . Other Mother 69       SBO, deceased  . Thyroid disease Brother   . Thyroid disease Sister   . Breast cancer Neg Hx   . Colon cancer Neg Hx   . Esophageal cancer Neg Hx   . Rectal cancer Neg Hx   . Stomach cancer Neg Hx   . Diabetes Neg Hx     BP 134/82 (BP Location: Left Arm, Patient Position: Sitting, Cuff Size: Normal)   Pulse 80   Ht 5\' 6"  (1.676 m)   Wt 158 lb 6.4 oz (71.8 kg)   SpO2 95%   BMI 25.57 kg/m    Review of Systems She denies hypoglycemia.      Objective:   Physical Exam VITAL SIGNS:  See vs page GENERAL: no distress Pulses: dorsalis pedis intact bilat.   MSK: no deformity of the feet CV: no leg edema Skin:  no ulcer on the feet.  normal color and temp on the feet.  Neuro: sensation is intact to touch on the feet.   Ext: There is bilateral onychomycosis of the toenails.    Lab Results  Component Value Date   HGBA1C 5.6 10/25/2018        Assessment & Plan:  Type 2 DM: well-controlled Goiter: she'll be due to f/u US next time.   Patient Instructions  check your blood sugar once a day.  vary the time of day when you check, between before the 3 meals, and at bedtime.  also check if you have symptoms of your blood sugar being too high or too low.  please keep a record of the readings and bring it to your next appointment here (or you can bring the meter itself).  You can write it on any piece of paper.  please call us sooner if your blood  sugar goes below 70, or if you have a lot of readings over 200.  We'll plan to recheck the thyroid ultrasound next time.   Please continue the same metformin. Please come back for a follow-up appointment in 4 months.

## 2018-10-28 ENCOUNTER — Telehealth: Payer: Self-pay | Admitting: Physician Assistant

## 2018-10-28 NOTE — Telephone Encounter (Signed)
Patient states since taking the medication flagyl she feels nauseous and  Has a bad taste in her mouth. Patient wanting to know if there is something else she can take.

## 2018-10-28 NOTE — Telephone Encounter (Signed)
The pt was given flagyl and levaquin for diverticulitis on 10/24/18.  She has developed nausea and would like something to help with that.  She says she was given "nausea medication" last year when taking flagyl for a flare of diverticulitis.  Please advise

## 2018-10-29 MED ORDER — ONDANSETRON 4 MG PO TBDP: 4.0000 mg | ORAL_TABLET | Freq: Four times a day (QID) | ORAL | 1 refills | Status: DC

## 2018-10-29 NOTE — Telephone Encounter (Signed)
Zofran 4mg  ODT q4-6 hours as needed #15 with 1 refill. Thanks-JLL

## 2018-10-29 NOTE — Telephone Encounter (Signed)
The patient has been notified of this information and all questions answered.   We have sent medications to your pharmacy for pick up.

## 2018-11-04 DIAGNOSIS — R69 Illness, unspecified: Secondary | ICD-10-CM | POA: Diagnosis not present

## 2018-11-06 DIAGNOSIS — H7312 Chronic myringitis, left ear: Secondary | ICD-10-CM | POA: Diagnosis not present

## 2018-11-18 ENCOUNTER — Ambulatory Visit (INDEPENDENT_AMBULATORY_CARE_PROVIDER_SITE_OTHER)
Admission: RE | Admit: 2018-11-18 | Discharge: 2018-11-18 | Disposition: A | Discharge status: Home / Self Care | Payer: Medicare HMO | Source: Ambulatory Visit | Attending: Internal Medicine | Admitting: Internal Medicine

## 2018-11-18 ENCOUNTER — Encounter: Payer: Self-pay | Admitting: Internal Medicine

## 2018-11-18 ENCOUNTER — Ambulatory Visit (INDEPENDENT_AMBULATORY_CARE_PROVIDER_SITE_OTHER): Payer: Medicare HMO | Admitting: Internal Medicine

## 2018-11-18 VITALS — BP 144/100 | HR 86 | Temp 98.6°F | Ht 66.0 in | Wt 158.0 lb

## 2018-11-18 DIAGNOSIS — R0602 Shortness of breath: Secondary | ICD-10-CM

## 2018-11-18 DIAGNOSIS — R079 Chest pain, unspecified: Secondary | ICD-10-CM | POA: Diagnosis not present

## 2018-11-18 NOTE — Assessment & Plan Note (Signed)
Ordered chest x-ray to rule out occult pneumonia. We did discuss that she has had gallstones in the past which could be causing some discomfort and if no resolution and no findings on chest x-ray.

## 2018-11-18 NOTE — Progress Notes (Signed)
   Subjective:   Patient ID: Sonya Brady, female    DOB: [birth year], 68 y.o.   MRN: [id number]  HPI The patient is a 68 YO female coming in for concerns about right flank pain and some tightness of breath. She feels similar to when she had a pneumonia years ago but not as bad. She denies cough. Denies fevers or chills. Denies abdominal pain. Did recently have an episode of diverticulitis and treated with antibiotics a couple of weeks ago. Still has some diarrhea leftover from that but overall improving. Denies activity changes due to SOB (chest tightness and feels cannot get full breath).   Review of Systems  Constitutional: Negative.   HENT: Negative.   Eyes: Negative.   Respiratory: Positive for shortness of breath. Negative for cough and chest tightness.   Cardiovascular: Negative for chest pain, palpitations and leg swelling.  Gastrointestinal: Negative for abdominal distention, abdominal pain, constipation, diarrhea, nausea and vomiting.  Musculoskeletal: Positive for myalgias.  Skin: Negative.   Neurological: Negative.   Psychiatric/Behavioral: Negative.     Objective:  Physical Exam Constitutional:      Appearance: She is well-developed.  HENT:     Head: Normocephalic and atraumatic.  Neck:     Musculoskeletal: Normal range of motion.  Cardiovascular:     Rate and Rhythm: Normal rate and regular rhythm.  Pulmonary:     Effort: Pulmonary effort is normal. No respiratory distress.     Breath sounds: Normal breath sounds. No wheezing or rales.  Abdominal:     General: Bowel sounds are normal. There is no distension.     Palpations: Abdomen is soft.     Tenderness: There is no abdominal tenderness. There is no rebound.  Skin:    General: Skin is warm and dry.  Neurological:     Mental Status: She is alert and oriented to person, place, and time.     Coordination: Coordination normal.    Vitals:   11/18/18 1335  BP: (!) 144/100  Pulse: 86  Temp: 98.6 F (37 C)    TempSrc: Oral  SpO2: 98%  Weight: 158 lb (71.7 kg)  Height: 5\' 6"  (1.676 m)    Assessment & Plan:

## 2018-11-18 NOTE — Patient Instructions (Addendum)
We will check an x-ray of the lungs and ribs to check that.   If we can not find anything to cause this we will get an ultrasound of the stomach.

## 2018-11-19 ENCOUNTER — Encounter: Payer: Self-pay | Admitting: Internal Medicine

## 2018-11-19 ENCOUNTER — Other Ambulatory Visit: Payer: Self-pay | Admitting: Endocrinology

## 2018-11-19 ENCOUNTER — Other Ambulatory Visit: Payer: Self-pay | Admitting: Internal Medicine

## 2018-11-19 NOTE — Progress Notes (Signed)
Abstracted and sent to scan  

## 2018-12-03 ENCOUNTER — Other Ambulatory Visit: Payer: Self-pay | Admitting: Internal Medicine

## 2018-12-03 ENCOUNTER — Other Ambulatory Visit: Payer: Self-pay | Admitting: Endocrinology

## 2018-12-06 ENCOUNTER — Other Ambulatory Visit: Payer: Self-pay

## 2018-12-06 MED ORDER — OMEPRAZOLE 20 MG PO CPDR: 20.0000 mg | DELAYED_RELEASE_CAPSULE | Freq: Every day | ORAL | 1 refills | Status: DC

## 2018-12-10 DIAGNOSIS — R69 Illness, unspecified: Secondary | ICD-10-CM | POA: Diagnosis not present

## 2018-12-18 ENCOUNTER — Other Ambulatory Visit: Payer: Self-pay | Admitting: Internal Medicine

## 2018-12-27 ENCOUNTER — Encounter: Payer: Self-pay | Admitting: Internal Medicine

## 2018-12-27 ENCOUNTER — Other Ambulatory Visit: Payer: Self-pay

## 2018-12-27 ENCOUNTER — Ambulatory Visit (INDEPENDENT_AMBULATORY_CARE_PROVIDER_SITE_OTHER): Payer: Medicare HMO | Admitting: Internal Medicine

## 2018-12-27 ENCOUNTER — Other Ambulatory Visit (INDEPENDENT_AMBULATORY_CARE_PROVIDER_SITE_OTHER): Payer: Medicare HMO

## 2018-12-27 VITALS — BP 130/82 | HR 79 | Temp 98.6°F | Ht 66.0 in | Wt 160.0 lb

## 2018-12-27 DIAGNOSIS — Z0000 Encounter for general adult medical examination without abnormal findings: Secondary | ICD-10-CM | POA: Diagnosis not present

## 2018-12-27 DIAGNOSIS — E039 Hypothyroidism, unspecified: Secondary | ICD-10-CM | POA: Diagnosis not present

## 2018-12-27 DIAGNOSIS — K219 Gastro-esophageal reflux disease without esophagitis: Secondary | ICD-10-CM | POA: Diagnosis not present

## 2018-12-27 DIAGNOSIS — I10 Essential (primary) hypertension: Secondary | ICD-10-CM

## 2018-12-27 DIAGNOSIS — E785 Hyperlipidemia, unspecified: Secondary | ICD-10-CM | POA: Diagnosis not present

## 2018-12-27 DIAGNOSIS — E118 Type 2 diabetes mellitus with unspecified complications: Secondary | ICD-10-CM | POA: Diagnosis not present

## 2018-12-27 DIAGNOSIS — E1169 Type 2 diabetes mellitus with other specified complication: Secondary | ICD-10-CM

## 2018-12-27 LAB — COMPREHENSIVE METABOLIC PANEL
ALT: 14 U/L (ref 0–35)
AST: 12 U/L (ref 0–37)
Albumin: 4.5 g/dL (ref 3.5–5.2)
Alkaline Phosphatase: 69 U/L (ref 39–117)
BILIRUBIN TOTAL: 0.9 mg/dL (ref 0.2–1.2)
BUN: 15 mg/dL (ref 6–23)
CO2: 27 mEq/L (ref 19–32)
Calcium: 9.8 mg/dL (ref 8.4–10.5)
Chloride: 102 mEq/L (ref 96–112)
Creatinine, Ser: 0.59 mg/dL (ref 0.40–1.20)
GFR: 101 mL/min (ref >60.00)
Glucose, Bld: 96 mg/dL (ref 70–99)
Potassium: 3.5 mEq/L (ref 3.5–5.1)
Sodium: 140 mEq/L (ref 135–145)
TOTAL PROTEIN: 7.3 g/dL (ref 6.0–8.3)

## 2018-12-27 LAB — TSH: TSH: 3.57 u[IU]/mL (ref 0.35–4.50)

## 2018-12-27 LAB — LIPID PANEL
Cholesterol: 135 mg/dL (ref 0–200)
HDL: 45.1 mg/dL (ref >39.00)
LDL Cholesterol: 71 mg/dL (ref 0–99)
NonHDL: 90.35
Total CHOL/HDL Ratio: 3
Triglycerides: 98 mg/dL (ref 0.0–149.0)
VLDL: 19.6 mg/dL (ref 0.0–40.0)

## 2018-12-27 LAB — CBC
HEMATOCRIT: 39.6 % (ref 36.0–46.0)
Hemoglobin: 13.4 g/dL (ref 12.0–15.0)
MCHC: 33.9 g/dL (ref 30.0–36.0)
MCV: 87.9 fl (ref 78.0–100.0)
Platelets: 223 10*3/uL (ref 150.0–400.0)
RBC: 4.5 Mil/uL (ref 3.87–5.11)
RDW: 14.1 % (ref 11.5–15.5)
WBC: 5.9 10*3/uL (ref 4.0–10.5)

## 2018-12-27 LAB — T4, FREE: Free T4: 1.21 ng/dL (ref 0.60–1.60)

## 2018-12-27 MED ORDER — LISINOPRIL-HYDROCHLOROTHIAZIDE 20-12.5 MG PO TABS: 1.0000 | ORAL_TABLET | Freq: Every day | ORAL | 3 refills | Status: DC

## 2018-12-27 MED ORDER — ATORVASTATIN CALCIUM 80 MG PO TABS: 80.0000 mg | ORAL_TABLET | Freq: Every day | ORAL | 3 refills | Status: DC

## 2018-12-27 NOTE — Assessment & Plan Note (Signed)
BP at goal on lisinopril/hctz. Checking CMP and adjust as needed.  

## 2018-12-27 NOTE — Assessment & Plan Note (Signed)
Complicated by hyperlipidemia. Taking metformin 500 mg daily and on ACE-I and statin. Recent HgA1c without need for adjustment. Reminded about eye exam yearly.

## 2018-12-27 NOTE — Assessment & Plan Note (Signed)
Doing well on omeprazole 20 mg daily.

## 2018-12-27 NOTE — Assessment & Plan Note (Signed)
Flu shot up to date. Pneumonia up to date. Shingrix counseled. Tetanus up to date. Colonoscopy up to date. Mammogram counseled, pap smear aged out and dexa up to date. Counseled about sun safety and mole surveillance. Counseled about the dangers of distracted driving. Given 10 year screening recommendations.

## 2018-12-27 NOTE — Patient Instructions (Signed)
We will check the labs today.   Health Maintenance, Female Adopting a healthy lifestyle and getting preventive care can go a long way to promote health and wellness. Talk with your health care provider about what schedule of regular examinations is right for you. This is a good chance for you to check in with your provider about disease prevention and staying healthy. In between checkups, there are plenty of things you can do on your own. Experts have done a lot of research about which lifestyle changes and preventive measures are most likely to keep you healthy. Ask your health care provider for more information. Weight and diet Eat a healthy diet  Be sure to include plenty of vegetables, fruits, low-fat dairy products, and lean protein.  Do not eat a lot of foods high in solid fats, added sugars, or salt.  Get regular exercise. This is one of the most important things you can do for your health. ? Most adults should exercise for at least 150 minutes each week. The exercise should increase your heart rate and make you sweat (moderate-intensity exercise). ? Most adults should also do strengthening exercises at least twice a week. This is in addition to the moderate-intensity exercise. Maintain a healthy weight  Body mass index (BMI) is a measurement that can be used to identify possible weight problems. It estimates body fat based on height and weight. Your health care provider can help determine your BMI and help you achieve or maintain a healthy weight.  For females 69 years of age and older: ? A BMI below 18.5 is considered underweight. ? A BMI of 18.5 to 24.9 is normal. ? A BMI of 25 to 29.9 is considered overweight. ? A BMI of 30 and above is considered obese. Watch levels of cholesterol and blood lipids  You should start having your blood tested for lipids and cholesterol at 69 years of age, then have this test every 5 years.  You may need to have your cholesterol levels checked  more often if: ? Your lipid or cholesterol levels are high. ? You are older than 69 years of age. ? You are at high risk for heart disease. Cancer screening Lung Cancer  Lung cancer screening is recommended for adults 21-96 years old who are at high risk for lung cancer because of a history of smoking.  A yearly low-dose CT scan of the lungs is recommended for people who: ? Currently smoke. ? Have quit within the past 15 years. ? Have at least a 30-pack-year history of smoking. A pack year is smoking an average of one pack of cigarettes a day for 1 year.  Yearly screening should continue until it has been 15 years since you quit.  Yearly screening should stop if you develop a health problem that would prevent you from having lung cancer treatment. Breast Cancer  Practice breast self-awareness. This means understanding how your breasts normally appear and feel.  It also means doing regular breast self-exams. Let your health care provider know about any changes, no matter how small.  If you are in your 20s or 30s, you should have a clinical breast exam (CBE) by a health care provider every 1-3 years as part of a regular health exam.  If you are 4 or older, have a CBE every year. Also consider having a breast X-ray (mammogram) every year.  If you have a family history of breast cancer, talk to your health care provider about genetic screening.  If you  are at high risk for breast cancer, talk to your health care provider about having an MRI and a mammogram every year.  Breast cancer gene (BRCA) assessment is recommended for women who have family members with BRCA-related cancers. BRCA-related cancers include: ? Breast. ? Ovarian. ? Tubal. ? Peritoneal cancers.  Results of the assessment will determine the need for genetic counseling and BRCA1 and BRCA2 testing. Cervical Cancer Your health care provider may recommend that you be screened regularly for cancer of the pelvic organs  (ovaries, uterus, and vagina). This screening involves a pelvic examination, including checking for microscopic changes to the surface of your cervix (Pap test). You may be encouraged to have this screening done every 3 years, beginning at age 21.  For women ages 30-65, health care providers may recommend pelvic exams and Pap testing every 3 years, or they may recommend the Pap and pelvic exam, combined with testing for human papilloma virus (HPV), every 5 years. Some types of HPV increase your risk of cervical cancer. Testing for HPV may also be done on women of any age with unclear Pap test results.  Other health care providers may not recommend any screening for nonpregnant women who are considered low risk for pelvic cancer and who do not have symptoms. Ask your health care provider if a screening pelvic exam is right for you.  If you have had past treatment for cervical cancer or a condition that could lead to cancer, you need Pap tests and screening for cancer for at least 20 years after your treatment. If Pap tests have been discontinued, your risk factors (such as having a new sexual partner) need to be reassessed to determine if screening should resume. Some women have medical problems that increase the chance of getting cervical cancer. In these cases, your health care provider may recommend more frequent screening and Pap tests. Colorectal Cancer  This type of cancer can be detected and often prevented.  Routine colorectal cancer screening usually begins at 69 years of age and continues through 69 years of age.  Your health care provider may recommend screening at an earlier age if you have risk factors for colon cancer.  Your health care provider may also recommend using home test kits to check for hidden blood in the stool.  A small camera at the end of a tube can be used to examine your colon directly (sigmoidoscopy or colonoscopy). This is done to check for the earliest forms of  colorectal cancer.  Routine screening usually begins at age 50.  Direct examination of the colon should be repeated every 5-10 years through 69 years of age. However, you may need to be screened more often if early forms of precancerous polyps or small growths are found. Skin Cancer  Check your skin from head to toe regularly.  Tell your health care provider about any new moles or changes in moles, especially if there is a change in a mole's shape or color.  Also tell your health care provider if you have a mole that is larger than the size of a pencil eraser.  Always use sunscreen. Apply sunscreen liberally and repeatedly throughout the day.  Protect yourself by wearing long sleeves, pants, a wide-brimmed hat, and sunglasses whenever you are outside. Heart disease, diabetes, and high blood pressure  High blood pressure causes heart disease and increases the risk of stroke. High blood pressure is more likely to develop in: ? People who have blood pressure in the high end of   the normal range (130-139/85-89 mm Hg). ? People who are overweight or obese. ? People who are African American.  If you are 56-68 years of age, have your blood pressure checked every 3-5 years. If you are 49 years of age or older, have your blood pressure checked every year. You should have your blood pressure measured twice-once when you are at a hospital or clinic, and once when you are not at a hospital or clinic. Record the average of the two measurements. To check your blood pressure when you are not at a hospital or clinic, you can use: ? An automated blood pressure machine at a pharmacy. ? A home blood pressure monitor.  If you are between 17 years and 65 years old, ask your health care provider if you should take aspirin to prevent strokes.  Have regular diabetes screenings. This involves taking a blood sample to check your fasting blood sugar level. ? If you are at a normal weight and have a low risk for  diabetes, have this test once every three years after 69 years of age. ? If you are overweight and have a high risk for diabetes, consider being tested at a younger age or more often. Preventing infection Hepatitis B  If you have a higher risk for hepatitis B, you should be screened for this virus. You are considered at high risk for hepatitis B if: ? You were born in a country where hepatitis B is common. Ask your health care provider which countries are considered high risk. ? Your parents were born in a high-risk country, and you have not been immunized against hepatitis B (hepatitis B vaccine). ? You have HIV or AIDS. ? You use needles to inject street drugs. ? You live with someone who has hepatitis B. ? You have had sex with someone who has hepatitis B. ? You get hemodialysis treatment. ? You take certain medicines for conditions, including cancer, organ transplantation, and autoimmune conditions. Hepatitis C  Blood testing is recommended for: ? Everyone born from [birth year] through 1966. ? Anyone with known risk factors for hepatitis C. Sexually transmitted infections (STIs)  You should be screened for sexually transmitted infections (STIs) including gonorrhea and chlamydia if: ? You are sexually active and are younger than 69 years of age. ? You are older than 69 years of age and your health care provider tells you that you are at risk for this type of infection. ? Your sexual activity has changed since you were last screened and you are at an increased risk for chlamydia or gonorrhea. Ask your health care provider if you are at risk.  If you do not have HIV, but are at risk, it may be recommended that you take a prescription medicine daily to prevent HIV infection. This is called pre-exposure prophylaxis (PrEP). You are considered at risk if: ? You are sexually active and do not regularly use condoms or know the HIV status of your partner(s). ? You take drugs by injection. ? You are  sexually active with a partner who has HIV. Talk with your health care provider about whether you are at high risk of being infected with HIV. If you choose to begin PrEP, you should first be tested for HIV. You should then be tested every 3 months for as long as you are taking PrEP. Pregnancy  If you are premenopausal and you may become pregnant, ask your health care provider about preconception counseling.  If you may become pregnant, take 400  to 800 micrograms (mcg) of folic acid every day.  If you want to prevent pregnancy, talk to your health care provider about birth control (contraception). Osteoporosis and menopause  Osteoporosis is a disease in which the bones lose minerals and strength with aging. This can result in serious bone fractures. Your risk for osteoporosis can be identified using a bone density scan.  If you are 26 years of age or older, or if you are at risk for osteoporosis and fractures, ask your health care provider if you should be screened.  Ask your health care provider whether you should take a calcium or vitamin D supplement to lower your risk for osteoporosis.  Menopause may have certain physical symptoms and risks.  Hormone replacement therapy may reduce some of these symptoms and risks. Talk to your health care provider about whether hormone replacement therapy is right for you. Follow these instructions at home:  Schedule regular health, dental, and eye exams.  Stay current with your immunizations.  Do not use any tobacco products including cigarettes, chewing tobacco, or electronic cigarettes.  If you are pregnant, do not drink alcohol.  If you are breastfeeding, limit how much and how often you drink alcohol.  Limit alcohol intake to no more than 1 drink per day for nonpregnant women. One drink equals 12 ounces of beer, 5 ounces of wine, or 1 ounces of hard liquor.  Do not use street drugs.  Do not share needles.  Ask your health care  provider for help if you need support or information about quitting drugs.  Tell your health care provider if you often feel depressed.  Tell your health care provider if you have ever been abused or do not feel safe at home. This information is not intended to replace advice given to you by your health care provider. Make sure you discuss any questions you have with your health care provider. Document Released: 04/10/2011 Document Revised: 03/02/2016 Document Reviewed: 06/29/2015 Elsevier Interactive Patient Education  2019 [employer].

## 2018-12-27 NOTE — Assessment & Plan Note (Signed)
Checking lipid panel, adjust lipitor 80 mg daily if needed.

## 2018-12-27 NOTE — Assessment & Plan Note (Signed)
Checking TSH and adjust synthroid 100 mcg daily as needed.  

## 2018-12-27 NOTE — Progress Notes (Signed)
   Subjective:   Patient ID: Sonya Brady, female    DOB: 12-18-1950, 68 y.o.   MRN: [id number]  HPI Here for medicare wellness and physical, no new complaints. Please see A/P for status and treatment of chronic medical problems.   Diet: DM since diabetic Physical activity: sedentary, walks 1-2 times per week Depression/mood screen: negative, some anxiety she uses relaxation techniques Hearing: intact to whispered voice Visual acuity: grossly normal, performs annual eye exam  ADLs: capable Fall risk: none Home safety: good Cognitive evaluation: intact to orientation, naming, recall and repetition EOL planning: adv directives discussed    Office Visit from 12/27/2018 in [facility]  PHQ-2 Total Score  0      I have personally reviewed and have noted 1. The patient's medical and social history - reviewed today no changes 2. Their use of alcohol, tobacco or illicit drugs 3. Their current medications and supplements 4. The patient's functional ability including ADL's, fall risks, home safety risks and hearing or visual impairment. 5. Diet and physical activities 6. Evidence for depression or mood disorders 7. Care team reviewed and updated (available in snapshot)  Review of Systems  Constitutional: Negative.   HENT: Negative.   Eyes: Negative.   Respiratory: Negative for cough, chest tightness and shortness of breath.   Cardiovascular: Negative for chest pain, palpitations and leg swelling.  Gastrointestinal: Negative for abdominal distention, abdominal pain, constipation, diarrhea, nausea and vomiting.  Musculoskeletal: Negative.   Skin: Negative.   Neurological: Negative.   Psychiatric/Behavioral: The patient is nervous/anxious.     Objective:  Physical Exam Constitutional:      Appearance: She is well-developed.  HENT:     Head: Normocephalic and atraumatic.  Neck:     Musculoskeletal: Normal range of motion.  Cardiovascular:     Rate and  Rhythm: Normal rate and regular rhythm.  Pulmonary:     Effort: Pulmonary effort is normal. No respiratory distress.     Breath sounds: Normal breath sounds. No wheezing or rales.  Abdominal:     General: Bowel sounds are normal. There is no distension.     Palpations: Abdomen is soft.     Tenderness: There is no abdominal tenderness. There is no rebound.  Skin:    General: Skin is warm and dry.  Neurological:     Mental Status: She is alert and oriented to person, place, and time.     Coordination: Coordination normal.     Vitals:   12/27/18 0751  BP: 130/82  Pulse: 79  Temp: 98.6 F (37 C)  TempSrc: Oral  SpO2: 98%  Weight: 160 lb (72.6 kg)  Height: 5\' 6"  (1.676 m)    Assessment & Plan:

## 2018-12-30 DIAGNOSIS — H40023 Open angle with borderline findings, high risk, bilateral: Secondary | ICD-10-CM | POA: Diagnosis not present

## 2019-01-17 ENCOUNTER — Ambulatory Visit: Payer: Medicare HMO | Admitting: Podiatry

## 2019-01-18 DIAGNOSIS — R69 Illness, unspecified: Secondary | ICD-10-CM | POA: Diagnosis not present

## 2019-01-22 ENCOUNTER — Ambulatory Visit: Payer: Medicare HMO | Admitting: Podiatry

## 2019-02-14 ENCOUNTER — Other Ambulatory Visit: Payer: Self-pay | Admitting: Endocrinology

## 2019-02-23 ENCOUNTER — Other Ambulatory Visit: Payer: Self-pay | Admitting: Endocrinology

## 2019-02-23 DIAGNOSIS — R69 Illness, unspecified: Secondary | ICD-10-CM | POA: Diagnosis not present

## 2019-02-25 ENCOUNTER — Other Ambulatory Visit: Payer: Self-pay

## 2019-02-25 ENCOUNTER — Encounter: Payer: Self-pay | Admitting: Endocrinology

## 2019-02-25 ENCOUNTER — Ambulatory Visit (INDEPENDENT_AMBULATORY_CARE_PROVIDER_SITE_OTHER): Payer: Medicare HMO | Admitting: Endocrinology

## 2019-02-25 DIAGNOSIS — E118 Type 2 diabetes mellitus with unspecified complications: Secondary | ICD-10-CM | POA: Diagnosis not present

## 2019-02-25 MED ORDER — METFORMIN HCL ER 500 MG PO TB24: 1500.0000 mg | ORAL_TABLET | Freq: Every day | ORAL | 3 refills | Status: DC

## 2019-02-25 NOTE — Patient Instructions (Addendum)
check your blood sugar once a day.  vary the time of day when you check, between before the 3 meals, and at bedtime.  also check if you have symptoms of your blood sugar being too high or too low.  please keep a record of the readings and bring it to your next appointment here (or you can bring the meter itself).  You can write it on any piece of paper.  please call us sooner if your blood sugar goes below 70, or if you have a lot of readings over 200.  We'll plan to recheck the thyroid ultrasound next time.   Please reduce the metformin to 3 pills per day Please come back for a follow-up appointment in 1-2 months.

## 2019-02-25 NOTE — Progress Notes (Signed)
Subjective:    Patient ID: Sonya Brady, female    DOB: 20-Jul-1951, 68 y.o.   MRN: [id number]  HPI  telehealth visit today via phone x 7 minutes.   Alternatives to telehealth are presented to this patient, and the patient agrees to the telehealth visit. Pt is advised of the cost of the visit, and agrees to this, also.   Patient is at home, and I am at the office.   Persons attending the telehealth visit: the patient and I.  Pt returns for f/u of diabetes mellitus: DM type: 2 Dx'ed: 1696 Complications: none Therapy: metformin. GDM: never DKA: never Severe hypoglycemia: never Pancreatitis: never Pancreatic imaging: normal on 2019 CT Other: pt states she feels well in general, except for mild diarrhea.  She says cbg's are in the low-100's.   She takes synthroid 137 mcg/d, for hypothyroidism. US showed heterogeneous enlarged thyroid compatible with small goiter--bx not needed.  She does not notice the goiter.   Past Medical History:  Diagnosis Date  . Acne   . Allergy   . Breast mass, right   . Cataract   . Cholelithiasis   . Depression    situational when husband was sick and dying.  . Diverticulosis   . GERD (gastroesophageal reflux disease)   . H. pylori infection   . Hypercholesteremia   . Hypertension   . Hypothyroidism     Past Surgical History:  Procedure Laterality Date  . ABDOMINAL HYSTERECTOMY     both ovary intact  . BREAST LUMPECTOMY Bilateral   . carpel tunnel surgery Right   . TYMPANOPLASTY     tube left ear    Social History   Socioeconomic History  . Marital status: Single    Spouse name: Not on file  . Number of children: 2  . Years of education: Not on file  . Highest education level: Not on file  Occupational History  . Occupation: bookkeeper  Scientific laboratory technician  . Financial resource strain: Not on file  . Food insecurity:    Worry: Not on file    Inability: Not on file  . Transportation needs:    Medical: Not on file    Non-medical: Not  on file  Tobacco Use  . Smoking status: Never Smoker  . Smokeless tobacco: Never Used  Substance and Sexual Activity  . Alcohol use: No  . Drug use: No  . Sexual activity: Not on file  Lifestyle  . Physical activity:    Days per week: Not on file    Minutes per session: Not on file  . Stress: Not on file  Relationships  . Social connections:    Talks on phone: Not on file    Gets together: Not on file    Attends religious service: Not on file    Active member of club or organization: Not on file    Attends meetings of clubs or organizations: Not on file    Relationship status: Not on file  . Intimate partner violence:    Fear of current or ex partner: Not on file    Emotionally abused: Not on file    Physically abused: Not on file    Forced sexual activity: Not on file  Other Topics Concern  . Not on file  Social History Narrative   Married '72-widowed '97   HSG   1 daughter '81, 1 son '83: son going thru divorce ['09]; applying to medical school   Work: bookkeeper [employer]  Current Outpatient Medications on File Prior to Visit  Medication Sig Dispense Refill  . atorvastatin (LIPITOR) 80 MG tablet Take 1 tablet (80 mg total) by mouth daily. -- Office visit needed for further refills 90 tablet 3  . Calcium Carbonate-Vitamin D (CALCIUM + D PO) Take by mouth.    . EUTHYROX 100 MCG tablet TAKE 1 TABLET BY MOUTH ONCE DAILY BEFORE BREAKFAST 90 tablet 0  . glucose blood (ONETOUCH VERIO) test strip Used to check blood sugars one time daily. 100 each 12  . Lancets (ONETOUCH DELICA PLUS [id number]) MISC USE TO CHECK SUGARS ONCE DAILY 100 each 0  . latanoprost (XALATAN) 0.005 % ophthalmic solution Place 1 drop into both eyes at bedtime.    Marland Kitchen lisinopril-hydrochlorothiazide (PRINZIDE,ZESTORETIC) 20-12.5 MG tablet Take 1 tablet by mouth daily. -- Office visit needed for further refills 90 tablet 3  . Misc Natural Products (DEEP SLEEP PO) Take 200 mg by mouth once.    . Multiple  Vitamins-Minerals (MULTIVITAMIN PO) Take by mouth.    . Omega-3 Fatty Acids (FISH OIL PO) Take by mouth.    Marland Kitchen omeprazole (PRILOSEC) 20 MG capsule Take 1 capsule (20 mg total) by mouth daily. 90 capsule 1  . timolol (BETIMOL) 0.5 % ophthalmic solution 1 drop 2 (two) times daily.     No current facility-administered medications on file prior to visit.     Allergies  Allergen Reactions  . No Known Allergies     Family History  Problem Relation Age of Onset  . Other Father 70       deceased, unknown causes in 01/26/2023  . Hypertension Mother   . Other Mother 69       SBO, deceased  . Thyroid disease Brother   . Thyroid disease Sister   . Breast cancer Neg Hx   . Colon cancer Neg Hx   . Esophageal cancer Neg Hx   . Rectal cancer Neg Hx   . Stomach cancer Neg Hx   . Diabetes Neg Hx       Review of Systems She has lost a few lbs.      Objective:   Physical Exam     Assessment & Plan:  Type 2 DM: apparently well-controlled.  She declines a1c now Goiter: due for recheck soon. Hypothyroidism: weight loss is prob not related  Patient Instructions  check your blood sugar once a day.  vary the time of day when you check, between before the 3 meals, and at bedtime.  also check if you have symptoms of your blood sugar being too high or too low.  please keep a record of the readings and bring it to your next appointment here (or you can bring the meter itself).  You can write it on any piece of paper.  please call us sooner if your blood sugar goes below 70, or if you have a lot of readings over 200.  We'll plan to recheck the thyroid ultrasound next time.   Please reduce the metformin to 3 pills per day Please come back for a follow-up appointment in 1-2 months.

## 2019-03-27 ENCOUNTER — Telehealth: Payer: Self-pay | Admitting: Endocrinology

## 2019-03-27 ENCOUNTER — Other Ambulatory Visit: Payer: Self-pay

## 2019-03-27 DIAGNOSIS — E118 Type 2 diabetes mellitus with unspecified complications: Secondary | ICD-10-CM

## 2019-03-27 MED ORDER — METFORMIN HCL 500 MG PO TABS: ORAL_TABLET | ORAL | 2 refills | Status: DC

## 2019-03-27 NOTE — Telephone Encounter (Signed)
Patient has called to inform that her Metformin has been recalled at [facility], Patient request a call back.  Please Advise, Thanks

## 2019-03-27 NOTE — Telephone Encounter (Signed)
metFORMIN (GLUCOPHAGE) 500 MG tablet 180 tablet 2 03/27/2019    Sig: Take 3 tablets by mouth daily   Sent to pharmacy as: metFORMIN (GLUCOPHAGE) 500 MG tablet   E-Prescribing Status: Receipt confirmed by pharmacy (03/27/2019 2:10 PM EDT)

## 2019-04-02 DIAGNOSIS — Z1231 Encounter for screening mammogram for malignant neoplasm of breast: Secondary | ICD-10-CM | POA: Diagnosis not present

## 2019-04-02 DIAGNOSIS — N816 Rectocele: Secondary | ICD-10-CM | POA: Diagnosis not present

## 2019-04-02 DIAGNOSIS — Z01419 Encounter for gynecological examination (general) (routine) without abnormal findings: Secondary | ICD-10-CM | POA: Diagnosis not present

## 2019-04-21 DIAGNOSIS — H7312 Chronic myringitis, left ear: Secondary | ICD-10-CM | POA: Diagnosis not present

## 2019-04-25 ENCOUNTER — Other Ambulatory Visit: Payer: Self-pay

## 2019-04-29 ENCOUNTER — Other Ambulatory Visit: Payer: Self-pay

## 2019-04-29 ENCOUNTER — Ambulatory Visit: Payer: Medicare HMO | Admitting: Endocrinology

## 2019-04-29 ENCOUNTER — Encounter: Payer: Self-pay | Admitting: Endocrinology

## 2019-04-29 VITALS — BP 144/80 | HR 85 | Ht 66.0 in | Wt 156.4 lb

## 2019-04-29 DIAGNOSIS — E049 Nontoxic goiter, unspecified: Secondary | ICD-10-CM | POA: Diagnosis not present

## 2019-04-29 DIAGNOSIS — E118 Type 2 diabetes mellitus with unspecified complications: Secondary | ICD-10-CM | POA: Diagnosis not present

## 2019-04-29 DIAGNOSIS — E039 Hypothyroidism, unspecified: Secondary | ICD-10-CM

## 2019-04-29 LAB — POCT GLYCOSYLATED HEMOGLOBIN (HGB A1C): Hemoglobin A1C: 5.5 % (ref 4.0–5.6)

## 2019-04-29 LAB — TSH: TSH: 2.4 u[IU]/mL (ref 0.35–4.50)

## 2019-04-29 LAB — T4, FREE: Free T4: 1.04 ng/dL (ref 0.60–1.60)

## 2019-04-29 MED ORDER — METFORMIN HCL 500 MG PO TABS: 1000.0000 mg | ORAL_TABLET | Freq: Every day | ORAL | 3 refills | Status: DC

## 2019-04-29 NOTE — Progress Notes (Signed)
Subjective:    Patient ID: Sonya Brady, female    DOB: [birth year], 68 y.o.   MRN: [id number]  HPI Pt returns for f/u of diabetes mellitus: DM type: 2 Dx'ed: 3220 Complications: none Therapy: metformin. GDM: never DKA: never Severe hypoglycemia: never Pancreatitis: never Pancreatic imaging: normal on 2019 CT Other: pt states she feels well in general, except for mild diarrhea.  She says cbg's are in the low-100's.   She takes synthroid 137 mcg/d, for hypothyroidism. US showed heterogeneous enlarged thyroid compatible with small goiter--bx not needed.  She does not notice the goiter.  Past Medical History:  Diagnosis Date  . Acne   . Allergy   . Breast mass, right   . Cataract   . Cholelithiasis   . Depression    situational when husband was sick and dying.  . Diverticulosis   . GERD (gastroesophageal reflux disease)   . H. pylori infection   . Hypercholesteremia   . Hypertension   . Hypothyroidism     Past Surgical History:  Procedure Laterality Date  . ABDOMINAL HYSTERECTOMY     both ovary intact  . BREAST LUMPECTOMY Bilateral   . carpel tunnel surgery Right   . TYMPANOPLASTY     tube left ear    Social History   Socioeconomic History  . Marital status: Single    Spouse name: Not on file  . Number of children: 2  . Years of education: Not on file  . Highest education level: Not on file  Occupational History  . Occupation: bookkeeper  Scientific laboratory technician  . Financial resource strain: Not on file  . Food insecurity    Worry: Not on file    Inability: Not on file  . Transportation needs    Medical: Not on file    Non-medical: Not on file  Tobacco Use  . Smoking status: Never Smoker  . Smokeless tobacco: Never Used  Substance and Sexual Activity  . Alcohol use: No  . Drug use: No  . Sexual activity: Not on file  Lifestyle  . Physical activity    Days per week: Not on file    Minutes per session: Not on file  . Stress: Not on file  Relationships  .  Social Herbalist on phone: Not on file    Gets together: Not on file    Attends religious service: Not on file    Active member of club or organization: Not on file    Attends meetings of clubs or organizations: Not on file    Relationship status: Not on file  . Intimate partner violence    Fear of current or ex partner: Not on file    Emotionally abused: Not on file    Physically abused: Not on file    Forced sexual activity: Not on file  Other Topics Concern  . Not on file  Social History Narrative   Married '72-widowed '97   HSG   1 daughter '81, 1 son '83: son going thru divorce ['09]; applying to medical school   Work: bookkeeper [employer]    Current Outpatient Medications on File Prior to Visit  Medication Sig Dispense Refill  . atorvastatin (LIPITOR) 80 MG tablet Take 1 tablet (80 mg total) by mouth daily. -- Office visit needed for further refills 90 tablet 3  . Calcium Carbonate-Vitamin D (CALCIUM + D PO) Take by mouth.    . EUTHYROX 100 MCG tablet TAKE 1 TABLET BY MOUTH  ONCE DAILY BEFORE BREAKFAST 90 tablet 0  . glucose blood (ONETOUCH VERIO) test strip Used to check blood sugars one time daily. 100 each 12  . Lancets (ONETOUCH DELICA PLUS [id number]) MISC USE TO CHECK SUGARS ONCE DAILY 100 each 0  . latanoprost (XALATAN) 0.005 % ophthalmic solution Place 1 drop into both eyes at bedtime.    Marland Kitchen lisinopril-hydrochlorothiazide (PRINZIDE,ZESTORETIC) 20-12.5 MG tablet Take 1 tablet by mouth daily. -- Office visit needed for further refills 90 tablet 3  . Misc Natural Products (DEEP SLEEP PO) Take 200 mg by mouth once.    . Multiple Vitamins-Minerals (MULTIVITAMIN PO) Take by mouth.    . Omega-3 Fatty Acids (FISH OIL PO) Take by mouth.    Marland Kitchen omeprazole (PRILOSEC) 20 MG capsule Take 1 capsule (20 mg total) by mouth daily. 90 capsule 1  . timolol (BETIMOL) 0.5 % ophthalmic solution 1 drop 2 (two) times daily.     No current facility-administered medications on file  prior to visit.     Allergies  Allergen Reactions  . No Known Allergies     Family History  Problem Relation Age of Onset  . Other Father 53       deceased, unknown causes in Jan 07, 2023  . Hypertension Mother   . Other Mother 72       SBO, deceased  . Thyroid disease Brother   . Thyroid disease Sister   . Breast cancer Neg Hx   . Colon cancer Neg Hx   . Esophageal cancer Neg Hx   . Rectal cancer Neg Hx   . Stomach cancer Neg Hx   . Diabetes Neg Hx     BP (!) 144/80 (BP Location: Right Arm, Patient Position: Sitting, Cuff Size: Normal)   Pulse 85   Ht 5\' 6"  (1.676 m)   Wt 156 lb 6.4 oz (70.9 kg)   SpO2 96%   BMI 25.24 kg/m    Review of Systems She denies hypoglycemia    Objective:   Physical Exam VITAL SIGNS:  See vs page GENERAL: no distress NECK: Thyroid is twice normal size (R>L), with irreg surface, but no palpable nodule.  No palpable lymphadenopathy at the anterior neck. Pulses: dorsalis pedis intact bilat.   MSK: no deformity of the feet CV: no leg edema Skin:  no ulcer on the feet.  normal color and temp on the feet. Neuro: sensation is intact to touch on the feet  Lab Results  Component Value Date   HGBA1C 5.5 04/29/2019   Lab Results  Component Value Date   TSH 3.57 12/27/2018       Assessment & Plan:  Goiter: due for recheck Type 2 DM: well-controlled HTN: is noted today Diarrhea, new: poss due to metformin.   Patient Instructions  Your blood pressure is high today.  Please see your primary care provider soon, to have it rechecked Please reduce the metformin to 2 pills each morning. Thyroid blood tests are requested for you today.  We'll let you know about the results.  Let's recheck the ultrasound.  you will receive a phone call, about a day and time for an appointment.  Please come back for a follow-up appointment in 6 months.

## 2019-04-29 NOTE — Patient Instructions (Addendum)
Your blood pressure is high today.  Please see your primary care provider soon, to have it rechecked Please reduce the metformin to 2 pills each morning. Thyroid blood tests are requested for you today.  We'll let you know about the results.  Let's recheck the ultrasound.  you will receive a phone call, about a day and time for an appointment.  Please come back for a follow-up appointment in 6 months.

## 2019-05-05 DIAGNOSIS — R69 Illness, unspecified: Secondary | ICD-10-CM | POA: Diagnosis not present

## 2019-05-09 ENCOUNTER — Ambulatory Visit
Admission: RE | Admit: 2019-05-09 | Discharge: 2019-05-09 | Disposition: A | Discharge status: Home / Self Care | Payer: Medicare HMO | Source: Ambulatory Visit | Attending: Endocrinology | Admitting: Endocrinology

## 2019-05-09 DIAGNOSIS — E041 Nontoxic single thyroid nodule: Secondary | ICD-10-CM | POA: Diagnosis not present

## 2019-05-09 DIAGNOSIS — E049 Nontoxic goiter, unspecified: Secondary | ICD-10-CM

## 2019-05-12 ENCOUNTER — Other Ambulatory Visit: Payer: Self-pay | Admitting: Internal Medicine

## 2019-05-12 ENCOUNTER — Other Ambulatory Visit: Payer: Self-pay | Admitting: Endocrinology

## 2019-07-09 DIAGNOSIS — R0781 Pleurodynia: Secondary | ICD-10-CM | POA: Diagnosis not present

## 2019-07-12 ENCOUNTER — Ambulatory Visit (INDEPENDENT_AMBULATORY_CARE_PROVIDER_SITE_OTHER): Payer: Medicare HMO

## 2019-07-12 DIAGNOSIS — Z23 Encounter for immunization: Secondary | ICD-10-CM | POA: Diagnosis not present

## 2019-07-18 ENCOUNTER — Telehealth: Payer: Self-pay | Admitting: Internal Medicine

## 2019-07-18 MED ORDER — METRONIDAZOLE 250 MG PO TABS: 250.0000 mg | ORAL_TABLET | Freq: Three times a day (TID) | ORAL | 0 refills | Status: DC

## 2019-07-18 MED ORDER — LEVOFLOXACIN 250 MG PO TABS: 250.0000 mg | ORAL_TABLET | Freq: Two times a day (BID) | ORAL | 0 refills | Status: DC

## 2019-07-18 NOTE — Telephone Encounter (Signed)
Pt states Wednesday she started having pain in her LLQ. Report she has no fever, her abd is bloated, she feels the urge to go to the bathroom. Reports when she walks she needs to hold her Left side.Feels like it does when she has diverticulitis. Please advise.

## 2019-07-18 NOTE — Telephone Encounter (Signed)
Please prescribe the regimen that she stated previously worked well and that she tolerated: 1.  Levaquin 250 mg twice daily x10 days 2.  Metronidazole 250 mg 3 times daily x10 days 3.  Have her follow-up with 1 of the advanced practitioners in 1 to 2 weeks.

## 2019-07-18 NOTE — Telephone Encounter (Signed)
Pt aware, scripts sent to pharmacy. Pt scheduled to see Dr. Henrene Pastor 08/04/19@3 :40pm. Pt stated she wanted to see him as it has been over a year since she saw him.

## 2019-08-04 ENCOUNTER — Ambulatory Visit: Payer: Medicare HMO | Admitting: Internal Medicine

## 2019-08-04 ENCOUNTER — Encounter: Payer: Self-pay | Admitting: Internal Medicine

## 2019-08-04 VITALS — BP 134/74 | HR 88 | Temp 98.5°F | Ht 63.25 in | Wt 168.5 lb

## 2019-08-04 DIAGNOSIS — R1032 Left lower quadrant pain: Secondary | ICD-10-CM

## 2019-08-04 DIAGNOSIS — K5732 Diverticulitis of large intestine without perforation or abscess without bleeding: Secondary | ICD-10-CM | POA: Diagnosis not present

## 2019-08-04 NOTE — Patient Instructions (Signed)
Please follow up as needed 

## 2019-08-04 NOTE — Progress Notes (Signed)
HISTORY OF PRESENT ILLNESS:  Sonya Brady is a 68 y.o. female with a history of CT documented diverticulitis who presents today for follow-up of an apparent bout of acute diverticulitis.  She contacted this office July 18, 2019 with complaints of left lower quadrant pain reminiscent of her diverticulitis.  She was treated with Levaquin 250 mg twice daily for 10 days and metronidazole 250 mg 3 times daily for 10 days.  Approximately 4 days into therapy her pain resolved.  She thinks it may have been set off due to her diet.  Currently she is completely asymptomatic.  Patient also has a history of GERD for which she takes omeprazole.  Currently symptoms well controlled.  Last blood work March 2020 with normal hemoglobin of 13.4.  Last CT scan February [id number] with uncomplicated sigmoid diverticulitis.  Last upper endoscopy with esophageal dilation April 2018.  Last colonoscopy with polypectomy April 2018.  Negative for neoplasia  REVIEW OF SYSTEMS:  All non-GI ROS negative unless otherwise stated in the HPI except for sore throat  Past Medical History:  Diagnosis Date  . Acne   . Allergy   . Breast mass, right   . Cataract   . Cholelithiasis   . Depression    situational when husband was sick and dying.  . Diverticulosis   . GERD (gastroesophageal reflux disease)   . H. pylori infection   . Hypercholesteremia   . Hypertension   . Hypothyroidism     Past Surgical History:  Procedure Laterality Date  . ABDOMINAL HYSTERECTOMY     both ovary intact  . BREAST LUMPECTOMY Bilateral   . carpel tunnel surgery Right   . TYMPANOPLASTY     tube left ear    Social History Sonya Brady  reports that she has never smoked. She has never used smokeless tobacco. She reports that she does not drink alcohol or use drugs.  family history includes Hypertension in her mother; Other (age of onset: 20) in her father; Other (age of onset: 41) in her mother; Thyroid disease in her brother and  sister.  Allergies  Allergen Reactions  . No Known Allergies        PHYSICAL EXAMINATION: Vital signs: BP 134/74 (BP Location: Left Arm, Patient Position: Sitting, Cuff Size: Normal)   Pulse 88   Temp 98.5 F (36.9 C)   Ht 5' 3.25" (1.607 m) Comment: height measured without shoes  Wt 168 lb 8 oz (76.4 kg)   BMI 29.61 kg/m   Constitutional: generally well-appearing, no acute distress Psychiatric: alert and oriented x3, cooperative Eyes: extraocular movements intact, anicteric, conjunctiva pink Mouth: oral pharynx moist, no lesions Neck: supple no lymphadenopathy Cardiovascular: heart regular rate and rhythm, no murmur Lungs: clear to auscultation bilaterally Abdomen: soft, nontender, nondistended, no obvious ascites, no peritoneal signs, normal bowel sounds, no organomegaly Rectal: Omitted Extremities: no clubbing, cyanosis, or lower extremity edema bilaterally Skin: no lesions on visible extremities Neuro: No focal deficits.  Cranial nerves intact  ASSESSMENT:  1.  Acute sigmoid diverticulitis.  Resolved with antibiotic therapy 2.  Previous colonoscopy without neoplasia 3.  GERD complicated by peptic stricture.  Asymptomatic post dilation on PPI   PLAN:  1.  High-fiber diet 2.  Discussion on diverticular disease 3.  Reflux precautions 4.  Continue omeprazole 5.  Routine office follow-up for GERD management 1 year 6.  Consider repeat screening colonoscopy around 2028 15-minute spent face-to-face with the patient.  Greater than 50% of the time used for counseling  regarding her diverticular disease

## 2019-08-19 ENCOUNTER — Other Ambulatory Visit: Payer: Self-pay | Admitting: Endocrinology

## 2019-08-22 DIAGNOSIS — R69 Illness, unspecified: Secondary | ICD-10-CM | POA: Diagnosis not present

## 2019-09-15 DIAGNOSIS — H6522 Chronic serous otitis media, left ear: Secondary | ICD-10-CM | POA: Diagnosis not present

## 2019-09-15 DIAGNOSIS — H7312 Chronic myringitis, left ear: Secondary | ICD-10-CM | POA: Diagnosis not present

## 2019-10-07 ENCOUNTER — Other Ambulatory Visit: Payer: Self-pay | Admitting: Endocrinology

## 2019-10-08 DIAGNOSIS — R69 Illness, unspecified: Secondary | ICD-10-CM | POA: Diagnosis not present

## 2019-10-24 ENCOUNTER — Other Ambulatory Visit: Payer: Self-pay

## 2019-10-28 ENCOUNTER — Other Ambulatory Visit: Payer: Self-pay

## 2019-10-29 DIAGNOSIS — Z0184 Encounter for antibody response examination: Secondary | ICD-10-CM | POA: Diagnosis not present

## 2019-10-29 DIAGNOSIS — H6522 Chronic serous otitis media, left ear: Secondary | ICD-10-CM | POA: Diagnosis not present

## 2019-10-30 ENCOUNTER — Ambulatory Visit: Payer: Medicare HMO | Admitting: Endocrinology

## 2019-10-30 ENCOUNTER — Encounter: Payer: Self-pay | Admitting: Endocrinology

## 2019-10-30 ENCOUNTER — Other Ambulatory Visit: Payer: Self-pay

## 2019-10-30 VITALS — BP 164/88 | HR 81 | Ht 63.25 in | Wt 171.0 lb

## 2019-10-30 DIAGNOSIS — E118 Type 2 diabetes mellitus with unspecified complications: Secondary | ICD-10-CM | POA: Diagnosis not present

## 2019-10-30 LAB — POCT GLYCOSYLATED HEMOGLOBIN (HGB A1C): Hemoglobin A1C: 5.8 % — AB (ref 4.0–5.6)

## 2019-10-30 MED ORDER — METFORMIN HCL 500 MG PO TABS: 1000.0000 mg | ORAL_TABLET | Freq: Every day | ORAL | 3 refills | Status: DC

## 2019-10-30 NOTE — Patient Instructions (Addendum)
Your blood pressure is high today.  Please see your primary care provider soon, to have it rechecked Please continue the same metformin.   Please come back for a follow-up appointment in 6 months.    

## 2019-10-30 NOTE — Progress Notes (Signed)
Subjective:    Patient ID: Sonya Brady, female    DOB: 25-Dec-1950, 69 y.o.   MRN: [id number]  HPI Pt returns for f/u of diabetes mellitus: DM type: 2 Dx'ed: 0000000 Complications: none Therapy: metformin. GDM: never DKA: never Severe hypoglycemia: never Pancreatitis: never Pancreatic imaging: normal on 2019 CT.   Other: pt states she feels well in general.  She says cbg's are in the low-100's.   She takes synthroid 137 mcg/d, for hypothyroidism. [place] in 2020 showed heterogeneous enlarged thyroid compatible with small goiter--bx not needed.   Past Medical History:  Diagnosis Date  . Acne   . Allergy   . Breast mass, right   . Cataract   . Cholelithiasis   . Depression    situational when husband was sick and dying.  . Diverticulosis   . GERD (gastroesophageal reflux disease)   . H. pylori infection   . Hypercholesteremia   . Hypertension   . Hypothyroidism     Past Surgical History:  Procedure Laterality Date  . ABDOMINAL HYSTERECTOMY     both ovary intact  . BREAST LUMPECTOMY Bilateral   . carpel tunnel surgery Right   . TYMPANOPLASTY     tube left ear    Social History   Socioeconomic History  . Marital status: Single    Spouse name: Not on file  . Number of children: 2  . Years of education: Not on file  . Highest education level: Not on file  Occupational History  . Occupation: bookkeeper  Tobacco Use  . Smoking status: Never Smoker  . Smokeless tobacco: Never Used  Substance and Sexual Activity  . Alcohol use: No  . Drug use: No  . Sexual activity: Not on file  Other Topics Concern  . Not on file  Social History Narrative   Married '72-widowed '97   HSG   1 daughter '81, 1 son '83: son going thru divorce ['09]; applying to medical school   Work: bookkeeper [employer]   Social Determinants of Radio broadcast assistant Strain:   . Difficulty of Paying Living Expenses: Not on file  Food Insecurity:   . Worried About Charity fundraiser  in the Last Year: Not on file  . Ran Out of Food in the Last Year: Not on file  Transportation Needs:   . Lack of Transportation (Medical): Not on file  . Lack of Transportation (Non-Medical): Not on file  Physical Activity:   . Days of Exercise per Week: Not on file  . Minutes of Exercise per Session: Not on file  Stress:   . Feeling of Stress : Not on file  Social Connections:   . Frequency of Communication with Friends and Family: Not on file  . Frequency of Social Gatherings with Friends and Family: Not on file  . Attends Religious Services: Not on file  . Active Member of Clubs or Organizations: Not on file  . Attends Archivist Meetings: Not on file  . Marital Status: Not on file  Intimate Partner Violence:   . Fear of Current or Ex-Partner: Not on file  . Emotionally Abused: Not on file  . Physically Abused: Not on file  . Sexually Abused: Not on file    No current facility-administered medications on file prior to visit.   Current Outpatient Medications on File Prior to Visit  Medication Sig Dispense Refill  . atorvastatin (LIPITOR) 80 MG tablet Take 1 tablet (80 mg total) by mouth daily. --  Office visit needed for further refills 90 tablet 3  . Calcium Carbonate-Vitamin D (CALCIUM + D PO) Take by mouth.    . dorzolamide-timolol (COSOPT) 22.3-6.8 MG/ML ophthalmic solution Place 1 drop into both eyes 2 (two) times daily.    Marland Kitchen glucose blood (ONETOUCH VERIO) test strip Used to check blood sugars one time daily. 100 each 12  . latanoprost (XALATAN) 0.005 % ophthalmic solution Place 1 drop into both eyes at bedtime.    Marland Kitchen levothyroxine (SYNTHROID) 100 MCG tablet TAKE 1 TABLET BY MOUTH ONCE DAILY BEFORE BREAKFAST 90 tablet 0  . lisinopril-hydrochlorothiazide (PRINZIDE,ZESTORETIC) 20-12.5 MG tablet Take 1 tablet by mouth daily. -- Office visit needed for further refills 90 tablet 3  . Misc Natural Products (DEEP SLEEP PO) Take 200 mg by mouth once.    . Multiple  Vitamins-Minerals (MULTIVITAMIN PO) Take by mouth.    . Omega-3 Fatty Acids (FISH OIL PO) Take by mouth.    Marland Kitchen omeprazole (PRILOSEC) 20 MG capsule Take 1 capsule by mouth once daily 90 capsule 1  . ondansetron (ZOFRAN-ODT) 4 MG disintegrating tablet Take 1 tablet by mouth as needed.    Glory Rosebush Delica Lancets [id number] MISC USE TO CHECK SUGARS ONCE DAILY 100 each 0  . timolol (BETIMOL) 0.5 % ophthalmic solution 1 drop 2 (two) times daily.      Allergies  Allergen Reactions  . No Known Allergies     Family History  Problem Relation Age of Onset  . Other Father 45       deceased, unknown causes in January 15, 2023  . Hypertension Mother   . Other Mother 32       SBO, deceased  . Thyroid disease Brother   . Thyroid disease Sister   . Breast cancer Neg Hx   . Colon cancer Neg Hx   . Esophageal cancer Neg Hx   . Rectal cancer Neg Hx   . Stomach cancer Neg Hx   . Diabetes Neg Hx     BP (!) 164/88 (BP Location: Left Arm, Patient Position: Sitting, Cuff Size: Large)   Pulse 81   Ht 5' 3.25" (1.607 m)   Wt 171 lb (77.6 kg)   SpO2 98%   BMI 30.05 kg/m    Review of Systems Denies diarrhea.     Objective:   Physical Exam VITAL SIGNS:  See vs page GENERAL: no distress Pulses: dorsalis pedis intact bilat.   MSK: no deformity of the feet CV: trace bilat leg edema Skin:  no ulcer on the feet.  normal color and temp on the feet. Neuro: sensation is intact to touch on the feet Ext: there is bilateral onychomycosis of the toenails.     Lab Results  Component Value Date   HGBA1C 5.8 (A) 10/30/2019   Lab Results  Component Value Date   TSH 2.40 04/29/2019       Assessment & Plan:  HTN: is noted today Type 2 DM: well-controlled.   Patient Instructions  Your blood pressure is high today.  Please see your primary care provider soon, to have it rechecked Please continue the same metformin.   Please come back for a follow-up appointment in 6 months.

## 2019-11-01 ENCOUNTER — Emergency Department (HOSPITAL_COMMUNITY)
Admission: EM | Admit: 2019-11-01 | Discharge: 2019-11-01 | Disposition: A | Discharge status: Home / Self Care | Payer: Medicare HMO | Attending: Emergency Medicine | Admitting: Emergency Medicine

## 2019-11-01 ENCOUNTER — Encounter (HOSPITAL_COMMUNITY): Payer: Self-pay | Admitting: *Deleted

## 2019-11-01 ENCOUNTER — Emergency Department (HOSPITAL_COMMUNITY): Payer: Medicare HMO

## 2019-11-01 ENCOUNTER — Other Ambulatory Visit: Payer: Self-pay

## 2019-11-01 DIAGNOSIS — Z7984 Long term (current) use of oral hypoglycemic drugs: Secondary | ICD-10-CM | POA: Diagnosis not present

## 2019-11-01 DIAGNOSIS — R0602 Shortness of breath: Secondary | ICD-10-CM

## 2019-11-01 DIAGNOSIS — E039 Hypothyroidism, unspecified: Secondary | ICD-10-CM | POA: Insufficient documentation

## 2019-11-01 DIAGNOSIS — I10 Essential (primary) hypertension: Secondary | ICD-10-CM | POA: Insufficient documentation

## 2019-11-01 DIAGNOSIS — Z79899 Other long term (current) drug therapy: Secondary | ICD-10-CM | POA: Insufficient documentation

## 2019-11-01 DIAGNOSIS — E119 Type 2 diabetes mellitus without complications: Secondary | ICD-10-CM | POA: Insufficient documentation

## 2019-11-01 DIAGNOSIS — U071 COVID-19: Secondary | ICD-10-CM | POA: Diagnosis not present

## 2019-11-01 LAB — URINALYSIS, ROUTINE W REFLEX MICROSCOPIC
Bacteria, UA: NONE SEEN
Bilirubin Urine: NEGATIVE
Glucose, UA: NEGATIVE mg/dL
Hgb urine dipstick: NEGATIVE
Ketones, ur: NEGATIVE mg/dL
Nitrite: NEGATIVE
Protein, ur: NEGATIVE mg/dL
Specific Gravity, Urine: 1.011 (ref 1.005–1.030)
pH: 7 (ref 5.0–8.0)

## 2019-11-01 LAB — CBC
HCT: 41.1 % (ref 36.0–46.0)
Hemoglobin: 14 g/dL (ref 12.0–15.0)
MCH: 29.8 pg (ref 26.0–34.0)
MCHC: 34.1 g/dL (ref 30.0–36.0)
MCV: 87.4 fL (ref 80.0–100.0)
Platelets: 283 10*3/uL (ref 150–400)
RBC: 4.7 MIL/uL (ref 3.87–5.11)
RDW: 12.7 % (ref 11.5–15.5)
WBC: 7.5 10*3/uL (ref 4.0–10.5)
nRBC: 0 % (ref 0.0–0.2)

## 2019-11-01 LAB — COMPREHENSIVE METABOLIC PANEL
ALT: 19 U/L (ref 0–44)
AST: 18 U/L (ref 15–41)
Albumin: 4.3 g/dL (ref 3.5–5.0)
Alkaline Phosphatase: 75 U/L (ref 38–126)
Anion gap: 10 (ref 5–15)
BUN: 14 mg/dL (ref 8–23)
CO2: 25 mmol/L (ref 22–32)
Calcium: 9.6 mg/dL (ref 8.9–10.3)
Chloride: 104 mmol/L (ref 98–111)
Creatinine, Ser: 0.73 mg/dL (ref 0.44–1.00)
GFR calc Af Amer: >60 mL/min (ref >60)
GFR calc non Af Amer: >60 mL/min (ref >60)
Glucose, Bld: 159 mg/dL — ABNORMAL HIGH (ref 70–99)
Potassium: 3.5 mmol/L (ref 3.5–5.1)
Sodium: 139 mmol/L (ref 135–145)
Total Bilirubin: 0.6 mg/dL (ref 0.3–1.2)
Total Protein: 7.3 g/dL (ref 6.5–8.1)

## 2019-11-01 LAB — RESPIRATORY PANEL BY RT PCR (FLU A&B, COVID)
Influenza A by PCR: NEGATIVE
Influenza B by PCR: NEGATIVE
SARS Coronavirus 2 by RT PCR: POSITIVE — AB

## 2019-11-01 LAB — TROPONIN I (HIGH SENSITIVITY)
Troponin I (High Sensitivity): <2 ng/L (ref <18)
Troponin I (High Sensitivity): <2 ng/L (ref <18)

## 2019-11-01 LAB — D-DIMER, QUANTITATIVE: D-Dimer, Quant: 0.37 ug/mL-FEU (ref 0.00–0.50)

## 2019-11-01 LAB — LIPASE, BLOOD: Lipase: 43 U/L (ref 11–51)

## 2019-11-01 LAB — BRAIN NATRIURETIC PEPTIDE: B Natriuretic Peptide: 29 pg/mL (ref 0.0–100.0)

## 2019-11-01 MED ORDER — SODIUM CHLORIDE 0.9% FLUSH: 3.0000 mL | Freq: Once | INTRAVENOUS | Status: DC

## 2019-11-01 NOTE — Discharge Instructions (Addendum)
Please make sure you follow up with your PCP for your shortness of breath. It was unclear as to the cause but you may benefit from further cardiac workup. This could also be residual symptoms from a previous COVID infection. If you begin having chest pain, difficulty breathing, sweating, nausea, vomiting, or dizziness seek emergency care immediately.

## 2019-11-01 NOTE — ED Notes (Signed)
Pt ambulated to restroom with steady gait. Pt tolerated well.

## 2019-11-01 NOTE — ED Provider Notes (Signed)
[place] EMERGENCY DEPARTMENT Provider Note   CSN: [id number] Arrival date & time: 11/01/19  1521     History Chief Complaint  Patient presents with  . Shortness of Breath   Sonya Brady is a 69 y.o. female.  HPI  Sonya Brady is a 69y/o female with PMH of HTN, HLD, T2DM, Hypothyroidism, and diverticulosis who presents today for one week of progressive shortness of breath with associated sinus pressure and drainage. She also feels her throat is "full" and like something is draining. She denies chest pain or difficulty breathing. The episodes of dyspnea occur at random not associated with exertion, last several minutes, and self resolve. She denies pleuritic chest pain. She denies history of blood clots or PE. She has no history of smoking. No nausea, vomiting, or blood in the stool.    Past Medical History:  Diagnosis Date  . Acne   . Allergy   . Breast mass, right   . Cataract   . Cholelithiasis   . Depression    situational when husband was sick and dying.  . Diverticulosis   . GERD (gastroesophageal reflux disease)   . H. pylori infection   . Hypercholesteremia   . Hypertension   . Hypothyroidism     Patient Active Problem List   Diagnosis Date Noted  . Goiter 12/24/2017  . Controlled type 2 diabetes mellitus ([place]) 10/19/2017  . Chronic constipation 06/13/2016  . Routine health maintenance 07/24/2012  . Hyperlipidemia associated with type 2 diabetes mellitus ([place]) 07/23/2012  . GERD 09/05/2010  . Hypothyroidism 09/04/2007  . Essential hypertension 09/04/2007    Past Surgical History:  Procedure Laterality Date  . ABDOMINAL HYSTERECTOMY     both ovary intact  . BREAST LUMPECTOMY Bilateral   . carpel tunnel surgery Right   . TYMPANOPLASTY     tube left ear     OB History   No obstetric history on file.     Family History  Problem Relation Age of Onset  . Other Father 13       deceased, unknown causes in 01-17-23  . Hypertension  Mother   . Other Mother 29       SBO, deceased  . Thyroid disease Brother   . Thyroid disease Sister   . Breast cancer Neg Hx   . Colon cancer Neg Hx   . Esophageal cancer Neg Hx   . Rectal cancer Neg Hx   . Stomach cancer Neg Hx   . Diabetes Neg Hx     Social History   Tobacco Use  . Smoking status: Never Smoker  . Smokeless tobacco: Never Used  Substance Use Topics  . Alcohol use: No  . Drug use: No    Home Medications Prior to Admission medications   Medication Sig Start Date End Date Taking? Authorizing Provider  atorvastatin (LIPITOR) 80 MG tablet Take 1 tablet (80 mg total) by mouth daily. -- Office visit needed for further refills 12/27/18   Hoyt Koch, MD  Calcium Carbonate-Vitamin D (CALCIUM + D PO) Take by mouth.    [provider]  dorzolamide-timolol (COSOPT) 22.3-6.8 MG/ML ophthalmic solution Place 1 drop into both eyes 2 (two) times daily. 07/07/19   [provider]  glucose blood (ONETOUCH VERIO) test strip Used to check blood sugars one time daily. 03/19/18   Renato Shin, MD  latanoprost (XALATAN) 0.005 % ophthalmic solution Place 1 drop into both eyes at bedtime.    [provider]  levothyroxine (SYNTHROID) 100 MCG tablet TAKE 1 TABLET BY MOUTH ONCE DAILY BEFORE BREAKFAST 08/20/19   Renato Shin, MD  lisinopril-hydrochlorothiazide (PRINZIDE,ZESTORETIC) 20-12.5 MG tablet Take 1 tablet by mouth daily. -- Office visit needed for further refills 12/27/18   Hoyt Koch, MD  metFORMIN (GLUCOPHAGE) 500 MG tablet Take 2 tablets (1,000 mg total) by mouth daily with breakfast. 10/30/19   Renato Shin, MD  Misc Natural Products (DEEP SLEEP PO) Take 200 mg by mouth once.    [provider]  Multiple Vitamins-Minerals (MULTIVITAMIN PO) Take by mouth.    [provider]  Omega-3 Fatty Acids (FISH OIL PO) Take by mouth.    [provider]  omeprazole (PRILOSEC) 20 MG capsule Take 1 capsule by mouth once  daily 05/13/19   Irene Shipper, MD  ondansetron (ZOFRAN-ODT) 4 MG disintegrating tablet Take 1 tablet by mouth as needed.    [provider]  OneTouch Delica Lancets [id number] MISC USE TO CHECK SUGARS ONCE DAILY 10/08/19   Renato Shin, MD  timolol (BETIMOL) 0.5 % ophthalmic solution 1 drop 2 (two) times daily.    [provider]    Allergies    No known allergies  Review of Systems   Review of Systems  Constitutional: Negative for activity change, chills, diaphoresis, fatigue and fever.  HENT: Positive for congestion, sinus pressure and sore throat. Negative for facial swelling, rhinorrhea, sinus pain and sneezing.   Respiratory: Positive for shortness of breath. Negative for cough, chest tightness and wheezing.   Cardiovascular: Negative for chest pain, palpitations and leg swelling.  Gastrointestinal: Negative for abdominal distention, abdominal pain, blood in stool, constipation, diarrhea, nausea and vomiting.  Genitourinary: Negative for dysuria, frequency and urgency.  Musculoskeletal: Negative for arthralgias and myalgias.  Skin: Negative for pallor and rash.  Neurological: Negative for dizziness, syncope and light-headedness.    Physical Exam Updated Vital Signs BP (!) 165/89 (BP Location: Right Arm)   Pulse (!) 102   Temp 98.4 F (36.9 C) (Oral)   Resp 16   Ht 5\' 4"  (1.626 m)   Wt 77.6 kg   SpO2 100%   BMI 29.37 kg/m   Physical Exam Vitals and nursing note reviewed.  Constitutional:      General: She is not in acute distress.    Appearance: She is not ill-appearing.  HENT:     Head: Normocephalic and atraumatic.  Eyes:     Extraocular Movements: Extraocular movements intact.     Pupils: Pupils are equal, round, and reactive to light.  Neck:     Vascular: No JVD.  Cardiovascular:     Rate and Rhythm: Normal rate and regular rhythm.     Heart sounds: No murmur.  Pulmonary:     Effort: Pulmonary effort is normal. No tachypnea, accessory muscle usage  or respiratory distress.     Breath sounds: Normal breath sounds. No wheezing, rhonchi or rales.  Chest:     Chest wall: No tenderness.  Abdominal:     General: Bowel sounds are normal.     Palpations: Abdomen is soft. There is no mass.     Tenderness: There is no abdominal tenderness. There is no guarding or rebound.  Musculoskeletal:     Right lower leg: No tenderness. No edema.     Left lower leg: No tenderness. No edema.  Skin:    General: Skin is warm.     Findings: No erythema or rash.     Nails: There is no clubbing.  Neurological:     General: No focal deficit present.     Mental Status: She is alert and oriented to person, place, and time.     ED Results / Procedures / Treatments   Labs (all labs ordered are listed, but only abnormal results are displayed) Labs Reviewed  RESPIRATORY PANEL BY RT PCR (FLU A&B, COVID) - Abnormal; Notable for the following components:      Result Value   SARS Coronavirus 2 by RT PCR POSITIVE (*)    All other components within normal limits  COMPREHENSIVE METABOLIC PANEL - Abnormal; Notable for the following components:   Glucose, Bld 159 (*)    All other components within normal limits  URINALYSIS, ROUTINE W REFLEX MICROSCOPIC - Abnormal; Notable for the following components:   Leukocytes,Ua MODERATE (*)    All other components within normal limits  LIPASE, BLOOD  CBC  BRAIN NATRIURETIC PEPTIDE  D-DIMER, QUANTITATIVE (NOT AT [facility])  TROPONIN I (HIGH SENSITIVITY)  TROPONIN I (HIGH SENSITIVITY)    EKG EKG Interpretation  Date/Time:  Saturday November 01 2019 15:27:56 EST Ventricular Rate:  99 PR Interval:  152 QRS Duration: 82 QT Interval:  354 QTC Calculation: 454 R Axis:   75 Text Interpretation: Normal sinus rhythm Nonspecific ST abnormality No previous tracing Confirmed by Lajean Saver [phone]) on 11/01/2019 3:52:59 PM   Radiology DG Chest 2 View  Result Date: 11/01/2019 CLINICAL DATA:  Shortness of breath EXAM: CHEST -  2 VIEW COMPARISON:  11/18/2018 FINDINGS: The heart size and mediastinal contours are within normal limits. Both lungs are clear. The visualized skeletal structures are unremarkable. Aortic atherosclerosis is noted. IMPRESSION: No active cardiopulmonary disease. Electronically Signed   By: Constance Holster M.D.   On: 11/01/2019 16:04    Procedures Procedures (including critical care time)  Medications Ordered in ED Medications  sodium chloride flush (NS) 0.9 % injection 3 mL (has no administration in time range)    ED Course  I have reviewed the triage vital signs and the nursing notes.  Pertinent labs & imaging results that were available during my care of the patient were reviewed by me and considered in my medical decision making (see chart for details).  Sonya Abascal is a 69y/o female with PMH of HTN, HLD, T2DM, GERD, and diverticulosis who presented to the ED with symptoms of one week of randomly occurring worsening shortness of breath. A broad differential was considered including MI, new onset CHF, pulmonary emoboli, viral infectious process such COVID, and community acquired PNA. Her EKG did show some non-specific ST depression in leads V2, V3, and V5 however they were not greater than 68mm in depth. Her troponin's were negative x 2, making acute MI unlikely given no real chest pain and no specific ST changes on EKG.  Her CXR showed no consolidation or edema, her WBC was normal, and she was afebrile making CAP unlikely. During her time in the ED patient had no chest pain and was seen with normal O2 sats. Her d-dimer was negative when age adjusted and her Wells score was 1.5 making PE unlikely. Given her symptoms occur at random and at rest with no chest pain and her BNP was normal it is unlikely she has new onset CHF causing her symptoms. Patient did show me a positive COVID antibody test that she received just after Christmas that was positive for IgG and IgM antibodies to COVID however she  never developed symptoms of COVID infection per patient. Her symptoms could be due  from residual COVID-19 infection.   MDM Rules/Calculators/A&P                     ABIHAIL COALE was evaluated in Emergency Department on 11/01/2019 for the symptoms described in the history of present illness. She was evaluated in the context of the global COVID-19 pandemic, which necessitated consideration that the patient might be at risk for infection with the SARS-CoV-2 virus that causes COVID-19. Institutional protocols and algorithms that pertain to the evaluation of patients at risk for COVID-19 are in a state of rapid change based on information released by regulatory bodies including the CDC and federal and state organizations. These policies and algorithms were followed during the patient's care in the ED.  Final Clinical Impression(s) / ED Diagnoses Final diagnoses:  SOB (shortness of breath)    Rx / DC Orders ED Discharge Orders    None       Nuala Alpha, DO 11/01/19 1935    Elnora Morrison, MD 11/01/19 [phone]

## 2019-11-01 NOTE — ED Triage Notes (Signed)
The pt is c/o a post nasal drip sinus congestion and she feels like she cannot catch her breath  Symptoms for 3-4 days  No visible sob at present.

## 2019-11-03 ENCOUNTER — Telehealth: Payer: Self-pay | Admitting: Internal Medicine

## 2019-11-03 NOTE — Telephone Encounter (Signed)
Patient called [facility] on 11/02/19.  Stated that she was having SOB since Wed and had tested positive for COVID.  Patient wanted to know if she needed to quarantine.  Patient was advised to go to the ED.  Patient refused.   Patient is scheduled for a doxy on 11/04/19.

## 2019-11-03 NOTE — Telephone Encounter (Signed)
Noted  

## 2019-11-03 NOTE — Telephone Encounter (Signed)
Yes, if covid-19 positive needs to quarantine.

## 2019-11-03 NOTE — Telephone Encounter (Signed)
Patient called [facility] on 11/01/19 stating that "something in her throat was closing".  Patient was advised to go to the ED and went.  Patient has a doxy appt scheduled for 1/26.

## 2019-11-04 ENCOUNTER — Encounter: Payer: Self-pay | Admitting: Internal Medicine

## 2019-11-04 ENCOUNTER — Other Ambulatory Visit: Payer: Self-pay

## 2019-11-04 ENCOUNTER — Ambulatory Visit (INDEPENDENT_AMBULATORY_CARE_PROVIDER_SITE_OTHER): Payer: Medicare HMO | Admitting: Internal Medicine

## 2019-11-04 DIAGNOSIS — U071 COVID-19: Secondary | ICD-10-CM

## 2019-11-04 MED ORDER — AZITHROMYCIN 250 MG PO TABS: ORAL_TABLET | ORAL | 0 refills | Status: DC

## 2019-11-04 NOTE — Progress Notes (Addendum)
Virtual Visit via Audio Note  I connected with Sonya Brady on 11/04/19 at  8:40 AM EST by an audio-only enabled telemedicine application and verified that I am speaking with the correct person using two identifiers.  The patient and the provider were at separate locations throughout the entire encounter.   I discussed the limitations of evaluation and management by telemedicine and the availability of in person appointments. The patient expressed understanding and agreed to proceed. The patient and the provider were the only parties present for the visit unless noted in HPI below.  History of Present Illness: The patient is a 69 y.o. female with visit for recent diagnosis of covid-19. Started about 1 week ago. Has intermittent sob and nasal congestion in her throat. She did have covid-19 antibody test positive around Christmas but has never had symptoms of covid-19 in the past. This feels more similar to when she had gotten pneumonia in the past. Went to ER for diagnosis and they ruled out CAP, ACS, PE. Denies worsening symptoms. Overall it is stable. Has tried nothing for her symptoms.  Observations/Objective: Voice strong, no coughing or SOB during visit, A and O times 3  Assessment and Plan: See problem oriented charting  Follow Up Instructions: quarantine until 11/10/19 with new positive test and symptoms likely acute infection, rx azithromycin in case of developing pneumonia  Visit time 12 minutes in face to face communication with patient and coordination of care  I discussed the assessment and treatment plan with the patient. The patient was provided an opportunity to ask questions and all were answered. The patient agreed with the plan and demonstrated an understanding of the instructions.   The patient was advised to call back or seek an in-person evaluation if the symptoms worsen or if the condition fails to improve as anticipated.  Hoyt Koch, MD

## 2019-11-04 NOTE — Assessment & Plan Note (Signed)
Rx azithromycin for potential pneumonia. Advised if feeling better can return to work 11/10/19. Work note given.

## 2019-11-04 NOTE — Telephone Encounter (Signed)
Noted  

## 2019-11-05 ENCOUNTER — Encounter: Payer: Self-pay | Admitting: Internal Medicine

## 2019-11-05 ENCOUNTER — Other Ambulatory Visit: Payer: Self-pay | Admitting: Endocrinology

## 2019-11-06 ENCOUNTER — Encounter: Payer: Self-pay | Admitting: Internal Medicine

## 2019-11-10 ENCOUNTER — Encounter: Payer: Self-pay | Admitting: Internal Medicine

## 2019-11-10 DIAGNOSIS — I10 Essential (primary) hypertension: Secondary | ICD-10-CM

## 2019-11-11 MED ORDER — LISINOPRIL-HYDROCHLOROTHIAZIDE 20-25 MG PO TABS: 1.0000 | ORAL_TABLET | Freq: Every day | ORAL | 3 refills | Status: DC

## 2019-11-11 NOTE — Telephone Encounter (Signed)
Pt has been informed and scheduled for a lab visit on 3/2 @8am .

## 2019-11-11 NOTE — Telephone Encounter (Signed)
Can you call and schedule for labs in 3-4 weeks?

## 2019-11-12 ENCOUNTER — Other Ambulatory Visit: Payer: Self-pay | Admitting: *Deleted

## 2019-11-12 ENCOUNTER — Other Ambulatory Visit: Payer: Self-pay | Admitting: Internal Medicine

## 2019-11-12 ENCOUNTER — Other Ambulatory Visit: Payer: Self-pay | Admitting: Endocrinology

## 2019-11-12 DIAGNOSIS — E118 Type 2 diabetes mellitus with unspecified complications: Secondary | ICD-10-CM

## 2019-11-12 DIAGNOSIS — I10 Essential (primary) hypertension: Secondary | ICD-10-CM

## 2019-11-28 DIAGNOSIS — H6522 Chronic serous otitis media, left ear: Secondary | ICD-10-CM | POA: Diagnosis not present

## 2019-12-09 ENCOUNTER — Other Ambulatory Visit: Payer: Self-pay

## 2019-12-09 ENCOUNTER — Other Ambulatory Visit: Payer: Medicare HMO

## 2019-12-09 DIAGNOSIS — I10 Essential (primary) hypertension: Secondary | ICD-10-CM

## 2019-12-09 LAB — BASIC METABOLIC PANEL
BUN: 12 mg/dL (ref 6–23)
CO2: 28 mEq/L (ref 19–32)
Calcium: 9.8 mg/dL (ref 8.4–10.5)
Chloride: 104 mEq/L (ref 96–112)
Creatinine, Ser: 0.67 mg/dL (ref 0.40–1.20)
GFR: 86.97 mL/min (ref >60.00)
Glucose, Bld: 129 mg/dL — ABNORMAL HIGH (ref 70–99)
Potassium: 3.8 mEq/L (ref 3.5–5.1)
Sodium: 139 mEq/L (ref 135–145)

## 2019-12-16 DIAGNOSIS — R69 Illness, unspecified: Secondary | ICD-10-CM | POA: Diagnosis not present

## 2019-12-16 DIAGNOSIS — H40023 Open angle with borderline findings, high risk, bilateral: Secondary | ICD-10-CM | POA: Diagnosis not present

## 2019-12-16 DIAGNOSIS — E119 Type 2 diabetes mellitus without complications: Secondary | ICD-10-CM | POA: Diagnosis not present

## 2019-12-16 DIAGNOSIS — H524 Presbyopia: Secondary | ICD-10-CM | POA: Diagnosis not present

## 2019-12-16 DIAGNOSIS — Z961 Presence of intraocular lens: Secondary | ICD-10-CM | POA: Diagnosis not present

## 2019-12-16 LAB — HM DIABETES EYE EXAM

## 2019-12-17 ENCOUNTER — Telehealth: Payer: Self-pay

## 2019-12-17 NOTE — Telephone Encounter (Signed)
New message    The patient has questions regarding the COVID vaccine asking for a call back from the nurse.

## 2019-12-18 NOTE — Telephone Encounter (Signed)
CDC has changed guidelines to 45 days after positive test can be vaccinated so she is eligible now for vaccine. Can give information on how to get that.

## 2019-12-18 NOTE — Telephone Encounter (Signed)
Spoke to pt. She stated that PCP had advised her a while back that she could get a COVID vaccine in April after she had tested positive in January and also received the antibody test. I looked through previous phone and MyChart messages and I did see a conversation in regard to her testing positive but I did not see any notations in regard to a date in April that PCP stated she could be vaccinated. Please advise.

## 2019-12-18 NOTE — Telephone Encounter (Signed)
Pt has been informed.

## 2019-12-22 ENCOUNTER — Other Ambulatory Visit: Payer: Self-pay

## 2019-12-22 DIAGNOSIS — E118 Type 2 diabetes mellitus with unspecified complications: Secondary | ICD-10-CM

## 2019-12-22 MED ORDER — METFORMIN HCL 500 MG PO TABS: 1500.0000 mg | ORAL_TABLET | Freq: Every day | ORAL | 0 refills | Status: DC

## 2019-12-29 ENCOUNTER — Telehealth: Payer: Self-pay

## 2019-12-29 NOTE — Telephone Encounter (Signed)
New message    The patient called had Pfizer vaccine on Wednesday, March  17th, first dose.   C/o Right arm severe pain not getting better, taken Tylenol & ibuprofen every 4 hours at night is worse when she sleeps on that right side.

## 2019-12-30 DIAGNOSIS — M25511 Pain in right shoulder: Secondary | ICD-10-CM | POA: Diagnosis not present

## 2019-12-30 NOTE — Telephone Encounter (Signed)
New message:   Pt is calling back to follow up on her message from yesterday about her arm. Please advise.

## 2019-12-31 NOTE — Telephone Encounter (Signed)
Can we get pt in for an appointment today?

## 2020-01-01 ENCOUNTER — Telehealth: Payer: Self-pay | Admitting: Internal Medicine

## 2020-01-01 NOTE — Telephone Encounter (Signed)
Spoke with the pt 01/01/2020  about soreness from her COVID vaccine and she said she no longer needed the appointment and that she saw orthopedic and they did an x-ray to make sure everything was fine and told her what over the counter meds she needed to take.

## 2020-01-02 NOTE — Telephone Encounter (Signed)
See telephone encounter from 01/01/2020. Closing this encounter.  Sonya Brady  01/01/20 2:36 PM Note   Spoke with the pt 01/01/2020  about soreness from her COVID vaccine and she said she no longer needed the appointment and that she saw orthopedic and they did an x-ray to make sure everything was fine and told her what over the counter meds she needed to take.

## 2020-01-25 ENCOUNTER — Other Ambulatory Visit: Payer: Self-pay | Admitting: Endocrinology

## 2020-03-01 DIAGNOSIS — Z0100 Encounter for examination of eyes and vision without abnormal findings: Secondary | ICD-10-CM | POA: Diagnosis not present

## 2020-03-10 ENCOUNTER — Other Ambulatory Visit: Payer: Self-pay

## 2020-03-10 ENCOUNTER — Telehealth: Payer: Self-pay | Admitting: Internal Medicine

## 2020-03-10 MED ORDER — LEVOFLOXACIN 250 MG PO TABS: 250.0000 mg | ORAL_TABLET | Freq: Two times a day (BID) | ORAL | 0 refills | Status: DC

## 2020-03-10 MED ORDER — METRONIDAZOLE 250 MG PO TABS: 250.0000 mg | ORAL_TABLET | Freq: Three times a day (TID) | ORAL | 0 refills | Status: DC

## 2020-03-10 NOTE — Telephone Encounter (Signed)
Pt aware, scripts sent to pharmacy. Pt to call back for appt when back in town.

## 2020-03-10 NOTE — Telephone Encounter (Signed)
1.  Please send prescriptions for Flagyl and Levaquin as outlined. 2.  Liquid diet to be advanced as tolerated 3.  If symptoms worsen, she should present herself to the nearest emergency room, otherwise she should complete her course of therapy and follow-up in this office in 2 weeks

## 2020-03-10 NOTE — Telephone Encounter (Signed)
Pt states she thinks she is having a diverticulitis flare. Yesterday she started having discomfort and bloating in her LLQ, she does not thinks she has a fever. Pt is at the [facility] and does not have a thermometer. In October 2020 she had flare and received Levaquin 250mg  bid X10 days and Flagyl 250mg  tid X 10days. Pt will need script sent to [facility] [place]. Dr. Henrene Pastor please advise.

## 2020-03-16 ENCOUNTER — Telehealth: Payer: Self-pay | Admitting: Internal Medicine

## 2020-03-16 NOTE — Telephone Encounter (Signed)
Patient states she is getting a reaction to medications and she is seeking advise

## 2020-03-17 NOTE — Telephone Encounter (Signed)
It is not at all clear to me that these unusual complaints are related to her antibiotics, which she has tolerated in the past.  If the symptoms persist, she should see her PCP

## 2020-03-17 NOTE — Telephone Encounter (Signed)
Pt asking if Dr. Henrene Pastor thinks she needs another antibiotic since she has stopped the levaquin and flagyl.

## 2020-03-17 NOTE — Telephone Encounter (Signed)
Left message for pt to call back.  Pt received prescriptions for Levaquin 250mg  bid X10 days and Flagyl 250mg  tid X 10days on 6/2 for diverticulitis. Pt had stated that was the medication that worked best for her. Pt states yesterday she had a reaction to the meds and was very anxious, her balance was off, and she had tingling in her hands and feet. Pt took the meds 6/2 pm through lunch time on 6/8. She reports her abdominal symptoms are 90% gone but she wonders if she needs a different antibiotic as she could not finish the levaquin and flagyl. Please advise.

## 2020-03-17 NOTE — Telephone Encounter (Signed)
Again, I am not sure that she has had a medication reaction.  In any event, Flagyl can make people feel funny sometimes.  Thus, hold Flagyl but complete Levaquin.

## 2020-03-17 NOTE — Telephone Encounter (Signed)
Spoke with pt and she is aware. Pt scheduled for f/u with Dr. Henrene Pastor 03/23/20@8 :40am. Pt aware of appt.

## 2020-03-19 ENCOUNTER — Ambulatory Visit: Payer: Medicare HMO | Admitting: Nurse Practitioner

## 2020-03-23 ENCOUNTER — Ambulatory Visit: Payer: Medicare HMO | Admitting: Internal Medicine

## 2020-03-23 ENCOUNTER — Encounter: Payer: Self-pay | Admitting: Internal Medicine

## 2020-03-23 VITALS — BP 134/74 | HR 75 | Ht 63.25 in | Wt 169.5 lb

## 2020-03-23 DIAGNOSIS — K219 Gastro-esophageal reflux disease without esophagitis: Secondary | ICD-10-CM

## 2020-03-23 DIAGNOSIS — K5732 Diverticulitis of large intestine without perforation or abscess without bleeding: Secondary | ICD-10-CM

## 2020-03-23 MED ORDER — OMEPRAZOLE 20 MG PO CPDR: 20.0000 mg | DELAYED_RELEASE_CAPSULE | Freq: Every day | ORAL | 3 refills | Status: DC

## 2020-03-23 NOTE — Progress Notes (Signed)
HISTORY OF PRESENT ILLNESS:  Sonya Brady is a 69 y.o. female with a history of CT documented diverticulitis (February 2019) who was last seen in the office August 04, 2019 for follow-up after an apparent bout of acute diverticulitis.  See that dictation for details.  Patient recently contact the office complaining of left lower quadrant pain consistent with prior history of diverticulitis.  She was placed on Levaquin 250 mg twice daily and metronidazole 250 mg 3 times daily.  After several days he is feeling better.  Proximate 1 week into her treatment she noticed some problems with anxiety and tingling in her extremities.  She thought this might be medication side effect.  She was advised to complete Levaquin but hold Flagyl.  She presents today for follow-up.  She is pleased to report that her pain is gone.  She did have some transient constipation which was treated successfully with an over-the-counter laxative.  She does take omeprazole for GERD.  She has a history of peptic stricture.  Currently asymptomatic post dilation on PPI.  She is pleased.  GI review of systems is otherwise remarkable for bloating.  REVIEW OF SYSTEMS:  All non-GI ROS negative unless otherwise stated in the HPI except for anxiety, insomnia, allergies  Past Medical History:  Diagnosis Date  . Acne   . Allergy   . Breast mass, right   . Cataract   . Cholelithiasis   . Depression    situational when husband was sick and dying.  . Diverticulosis   . GERD (gastroesophageal reflux disease)   . H. pylori infection   . Hypercholesteremia   . Hypertension   . Hypothyroidism     Past Surgical History:  Procedure Laterality Date  . ABDOMINAL HYSTERECTOMY     both ovary intact  . BREAST LUMPECTOMY Bilateral   . carpel tunnel surgery Right   . TYMPANOPLASTY     tube left ear    Social History Sonya Brady  reports that she has never smoked. She has never used smokeless tobacco. She reports that she does not  drink alcohol and does not use drugs.  family history includes Hypertension in her mother; Other (age of onset: 40) in her father; Other (age of onset: 2) in her mother; Thyroid disease in her brother and sister.  Allergies  Allergen Reactions  . No Known Allergies        PHYSICAL EXAMINATION: Vital signs: BP 134/74 (BP Location: Left Arm, Patient Position: Sitting, Cuff Size: Normal)   Pulse 75   Ht 5' 3.25" (1.607 m)   Wt 169 lb 8 oz (76.9 kg)   SpO2 97%   BMI 29.79 kg/m   Constitutional: generally well-appearing, no acute distress Psychiatric: alert and oriented x3, cooperative Eyes: extraocular movements intact, anicteric, conjunctiva pink Mouth: oral pharynx moist, no lesions Neck: supple no lymphadenopathy Cardiovascular: heart regular rate and rhythm, no murmur Lungs: clear to auscultation bilaterally Abdomen: soft, nontender, nondistended, no obvious ascites, no peritoneal signs, normal bowel sounds, no organomegaly Rectal: Omitted Extremities: no clubbing, cyanosis, or lower extremity edema bilaterally Skin: no lesions on visible extremities Neuro: No focal deficits.  Cranial nerves intact  ASSESSMENT:  1.  Acute diverticulitis.  Recent flare.  Improved with antibiotic therapy 2.  Transient constipation.  Improved with laxative 3.  Diverticulosis.  Last complete colonoscopy April 2018.  Negative for neoplasia 4.  GERD complicated by peptic stricture.  She remains asymptomatic post dilation on omeprazole   PLAN:  1.  High-fiber  diet 2.  Discussion on diverticulitis 3.  Laxative of choice as needed 4.  Reflux precautions 5.  Prescribe omeprazole 40 mg daily.  11 refills.  Medication risks reviewed 6.  Surveillance colonoscopy around 2020 7.  Routine office follow-up 1 year.  Contact the office in the interim for any questions or problems Total time of 30 minutes was spent preparing to see the patient, reviewing test, obtaining history, performing medically  appropriate comprehensive physical exam, counseling the patient regarding her above listed issues, ordering patients, recommended follow-up, and documenting information in the clinical health record

## 2020-03-23 NOTE — Patient Instructions (Signed)
We have sent the following medications to your pharmacy for you to pick up at your convenience:  Omeprazole.  Please follow up in one year  

## 2020-04-28 ENCOUNTER — Ambulatory Visit: Payer: Medicare HMO | Admitting: Endocrinology

## 2020-04-28 ENCOUNTER — Encounter: Payer: Self-pay | Admitting: Endocrinology

## 2020-04-28 ENCOUNTER — Other Ambulatory Visit: Payer: Self-pay

## 2020-04-28 VITALS — BP 130/80 | HR 81 | Ht 63.25 in | Wt 172.0 lb

## 2020-04-28 DIAGNOSIS — E118 Type 2 diabetes mellitus with unspecified complications: Secondary | ICD-10-CM | POA: Diagnosis not present

## 2020-04-28 LAB — POCT GLYCOSYLATED HEMOGLOBIN (HGB A1C): Hemoglobin A1C: 6 % — AB (ref 4.0–5.6)

## 2020-04-28 LAB — TSH: TSH: 5.72 u[IU]/mL — ABNORMAL HIGH (ref 0.35–4.50)

## 2020-04-28 LAB — T4, FREE: Free T4: 1.05 ng/dL (ref 0.60–1.60)

## 2020-04-28 MED ORDER — LEVOTHYROXINE SODIUM 112 MCG PO TABS: 112.0000 ug | ORAL_TABLET | Freq: Every day | ORAL | 3 refills | Status: DC

## 2020-04-28 NOTE — Progress Notes (Signed)
Subjective:    Patient ID: Sonya Brady, female    DOB: 1951-06-15, 69 y.o.   MRN: [id number]  HPI Pt returns for f/u of diabetes mellitus: DM type: 2 Dx'ed: 5643 Complications: none Therapy: metformin. GDM: never DKA: never Severe hypoglycemia: never Pancreatitis: never Pancreatic imaging: normal on 2019 CT.   Other: pt states she feels well in general.  She says cbg's are in the low-100's.   She also has hypothyroidism (dx'ed 1983). [place] in 2020 showed heterogeneous enlarged thyroid compatible with small goiter--bx or f/u not needed.   Past Medical History:  Diagnosis Date  . Acne   . Allergy   . Breast mass, right   . Cataract   . Cholelithiasis   . Depression    situational when husband was sick and dying.  . Diverticulosis   . GERD (gastroesophageal reflux disease)   . H. pylori infection   . Hypercholesteremia   . Hypertension   . Hypothyroidism     Past Surgical History:  Procedure Laterality Date  . ABDOMINAL HYSTERECTOMY     both ovary intact  . BREAST LUMPECTOMY Bilateral   . carpel tunnel surgery Right   . TYMPANOPLASTY     tube left ear    Social History   Socioeconomic History  . Marital status: Single    Spouse name: Not on file  . Number of children: 2  . Years of education: Not on file  . Highest education level: Not on file  Occupational History  . Occupation: bookkeeper  Tobacco Use  . Smoking status: Never Smoker  . Smokeless tobacco: Never Used  Vaping Use  . Vaping Use: Never used  Substance and Sexual Activity  . Alcohol use: No  . Drug use: No  . Sexual activity: Not on file  Other Topics Concern  . Not on file  Social History Narrative   Married '72-widowed '97   HSG   1 daughter '81, 1 son '83: son going thru divorce ['09]; applying to medical school   Work: bookkeeper [employer]   Social Determinants of Radio broadcast assistant Strain:   . Difficulty of Paying Living Expenses:   Food Insecurity:   . Worried  About Charity fundraiser in the Last Year:   . Arboriculturist in the Last Year:   Transportation Needs:   . Film/video editor (Medical):   Marland Kitchen Lack of Transportation (Non-Medical):   Physical Activity:   . Days of Exercise per Week:   . Minutes of Exercise per Session:   Stress:   . Feeling of Stress :   Social Connections:   . Frequency of Communication with Friends and Family:   . Frequency of Social Gatherings with Friends and Family:   . Attends Religious Services:   . Active Member of Clubs or Organizations:   . Attends Archivist Meetings:   Marland Kitchen Marital Status:   Intimate Partner Violence:   . Fear of Current or Ex-Partner:   . Emotionally Abused:   Marland Kitchen Physically Abused:   . Sexually Abused:     Current Outpatient Medications on File Prior to Visit  Medication Sig Dispense Refill  . atorvastatin (LIPITOR) 80 MG tablet Take 1 tablet by mouth once daily 90 tablet 1  . Calcium Carbonate-Vitamin D (CALCIUM + D PO) Take by mouth.    . dorzolamide-timolol (COSOPT) 22.3-6.8 MG/ML ophthalmic solution Place 1 drop into both eyes 2 (two) times daily.    Marland Kitchen  glucose blood (ONETOUCH VERIO) test strip Used to check blood sugars one time daily. 100 each 12  . latanoprost (XALATAN) 0.005 % ophthalmic solution Place 1 drop into both eyes at bedtime.    Marland Kitchen lisinopril-hydrochlorothiazide (ZESTORETIC) 20-25 MG tablet Take 1 tablet by mouth daily. 90 tablet 3  . metFORMIN (GLUCOPHAGE) 500 MG tablet Take 3 tablets (1,500 mg total) by mouth daily. 270 tablet 0  . Misc Natural Products (DEEP SLEEP PO) Take 200 mg by mouth once.    . Multiple Vitamins-Minerals (MULTIVITAMIN PO) Take by mouth.    . Omega-3 Fatty Acids (FISH OIL PO) Take by mouth.    Marland Kitchen omeprazole (PRILOSEC) 20 MG capsule Take 1 capsule (20 mg total) by mouth daily. 90 capsule 3  . ondansetron (ZOFRAN-ODT) 4 MG disintegrating tablet Take 1 tablet by mouth as needed.    Glory Rosebush Delica Lancets 09G MISC USE TO CHECK SUGARS  ONCE DAILY 100 each 0  . timolol (BETIMOL) 0.5 % ophthalmic solution 1 drop 2 (two) times daily.     No current facility-administered medications on file prior to visit.    Allergies  Allergen Reactions  . No Known Allergies     Family History  Problem Relation Age of Onset  . Other Father 44       deceased, unknown causes in 01-27-2023  . Hypertension Mother   . Other Mother 26       SBO, deceased  . Thyroid disease Brother   . Thyroid disease Sister   . Breast cancer Neg Hx   . Colon cancer Neg Hx   . Esophageal cancer Neg Hx   . Rectal cancer Neg Hx   . Stomach cancer Neg Hx   . Diabetes Neg Hx     BP 130/80 (BP Location: Left Arm, Patient Position: Sitting, Cuff Size: Normal)   Pulse 81   Ht 5' 3.25" (1.607 m)   Wt 172 lb (78 kg)   SpO2 98%   BMI 30.23 kg/m    Review of Systems Denies nausea.     Objective:   Physical Exam VITAL SIGNS:  See vs page GENERAL: no distress Pulses: dorsalis pedis intact bilat.   MSK: no deformity of the feet CV: no leg edema Skin:  no ulcer on the feet.  normal color and temp on the feet. Neuro: sensation is intact to touch on the feet Ext: there is bilateral onychomycosis of the toenails  A1c=6.0%.    Lab Results  Component Value Date   CREATININE 0.67 12/09/2019   BUN 12 12/09/2019   NA 139 12/09/2019   K 3.8 12/09/2019   CL 104 12/09/2019   CO2 28 12/09/2019   Lab Results  Component Value Date   TSH 5.72 (H) 04/28/2020      Assessment & Plan:  Hypothyroidism: she needs increased rx.  I have sent a prescription to your pharmacy, to increase synthroid Type 2 DM: well-controlled.   Patient Instructions  Thyroid blood tests are requested for you today.  We'll let you know about the results.   Please continue the same metformin.   Please come back for a follow-up appointment in 6 months.

## 2020-04-28 NOTE — Patient Instructions (Addendum)
Thyroid blood tests are requested for you today.  We'll let you know about the results.   Please continue the same metformin.   Please come back for a follow-up appointment in 6 months.

## 2020-05-05 DIAGNOSIS — R69 Illness, unspecified: Secondary | ICD-10-CM | POA: Diagnosis not present

## 2020-05-12 ENCOUNTER — Ambulatory Visit: Payer: Medicare HMO | Admitting: Internal Medicine

## 2020-05-28 DIAGNOSIS — H6522 Chronic serous otitis media, left ear: Secondary | ICD-10-CM | POA: Diagnosis not present

## 2020-05-28 DIAGNOSIS — Z9622 Myringotomy tube(s) status: Secondary | ICD-10-CM | POA: Diagnosis not present

## 2020-06-03 ENCOUNTER — Telehealth: Payer: Self-pay | Admitting: Endocrinology

## 2020-06-03 ENCOUNTER — Other Ambulatory Visit: Payer: Self-pay

## 2020-06-03 DIAGNOSIS — E118 Type 2 diabetes mellitus with unspecified complications: Secondary | ICD-10-CM

## 2020-06-03 MED ORDER — METFORMIN HCL 500 MG PO TABS: 500.0000 mg | ORAL_TABLET | Freq: Two times a day (BID) | ORAL | 3 refills | Status: DC

## 2020-06-03 NOTE — Telephone Encounter (Signed)
Patient has advised [facility] that she is only taking Metformin 2x per day not 3x per day.  [employer] called to let this office know so that we can out-reach to patient and clarify her dosing instructions.

## 2020-06-03 NOTE — Telephone Encounter (Signed)
Please review and advise.

## 2020-06-03 NOTE — Telephone Encounter (Signed)
Ok, I corrected med list.  Thank you.

## 2020-06-03 NOTE — Telephone Encounter (Signed)
Outpatient Medication Detail   Disp Refills Start End   metFORMIN (GLUCOPHAGE) 500 MG tablet 180 tablet 3 06/03/2020    Sig - Route: Take 1 tablet (500 mg total) by mouth 2 (two) times daily with a meal. - Oral   Sent to pharmacy as: metFORMIN (GLUCOPHAGE) 500 MG tablet   Notes to Pharmacy: DOSAGE CONFIRMED BY DR. Loanne Drilling. PLEASE REFILL IF PT IS IN NEED OF REFILL   E-Prescribing Status: Receipt confirmed by pharmacy (06/03/2020 10:53 AM EDT)

## 2020-06-13 ENCOUNTER — Other Ambulatory Visit: Payer: Self-pay | Admitting: Internal Medicine

## 2020-06-16 ENCOUNTER — Telehealth: Payer: Self-pay | Admitting: Endocrinology

## 2020-06-16 DIAGNOSIS — E039 Hypothyroidism, unspecified: Secondary | ICD-10-CM

## 2020-06-16 NOTE — Telephone Encounter (Signed)
Please come in to have TFT drawn.  I placed request

## 2020-06-16 NOTE — Telephone Encounter (Signed)
Please advise 

## 2020-06-16 NOTE — Addendum Note (Signed)
Addended by: Renato Shin on: 06/16/2020 04:09 PM   Modules accepted: Orders

## 2020-06-16 NOTE — Telephone Encounter (Signed)
Called pt and scheduled her to have labs drawn tomorrow at 2:45pm.

## 2020-06-16 NOTE — Telephone Encounter (Signed)
Patient called stating since Dr Loanne Drilling changed her dosage for the Euthyrox from 100 to 112, shes been feeling very tired, and her sleep schedule has been messing up. [phone]

## 2020-06-17 ENCOUNTER — Other Ambulatory Visit (INDEPENDENT_AMBULATORY_CARE_PROVIDER_SITE_OTHER): Payer: Medicare HMO

## 2020-06-17 ENCOUNTER — Other Ambulatory Visit: Payer: Self-pay

## 2020-06-17 DIAGNOSIS — E039 Hypothyroidism, unspecified: Secondary | ICD-10-CM | POA: Diagnosis not present

## 2020-06-17 LAB — TSH: TSH: 2.15 u[IU]/mL (ref 0.35–4.50)

## 2020-06-17 LAB — T4, FREE: Free T4: 1.1 ng/dL (ref 0.60–1.60)

## 2020-06-21 ENCOUNTER — Other Ambulatory Visit: Payer: Self-pay | Admitting: Internal Medicine

## 2020-07-20 ENCOUNTER — Ambulatory Visit: Payer: Medicare HMO | Attending: Internal Medicine

## 2020-07-20 DIAGNOSIS — Z23 Encounter for immunization: Secondary | ICD-10-CM

## 2020-07-20 NOTE — Progress Notes (Signed)
   Covid-19 Vaccination Clinic  Name:  Sonya Brady    MRN: [id number] DOB: 07-20-1951  07/20/2020  Ms. Bullen was observed post Covid-19 immunization for 15 minutes without incident. She was provided with Vaccine Information Sheet and instruction to access the V-Safe system.   Ms. Lewan was instructed to call 911 with any severe reactions post vaccine: Marland Kitchen Difficulty breathing  . Swelling of face and throat  . A fast heartbeat  . A bad rash all over body  . Dizziness and weakness

## 2020-08-04 ENCOUNTER — Other Ambulatory Visit: Payer: Self-pay

## 2020-08-04 ENCOUNTER — Ambulatory Visit (INDEPENDENT_AMBULATORY_CARE_PROVIDER_SITE_OTHER): Payer: Medicare HMO

## 2020-08-04 DIAGNOSIS — Z23 Encounter for immunization: Secondary | ICD-10-CM | POA: Diagnosis not present

## 2020-08-17 DIAGNOSIS — H40023 Open angle with borderline findings, high risk, bilateral: Secondary | ICD-10-CM | POA: Diagnosis not present

## 2020-08-27 ENCOUNTER — Telehealth: Payer: Self-pay | Admitting: Internal Medicine

## 2020-08-27 ENCOUNTER — Other Ambulatory Visit: Payer: Self-pay

## 2020-08-27 MED ORDER — LEVOFLOXACIN 250 MG PO TABS: 250.0000 mg | ORAL_TABLET | Freq: Two times a day (BID) | ORAL | 0 refills | Status: DC

## 2020-08-27 NOTE — Telephone Encounter (Signed)
Spoke with pt and she is aware. Script sent to pharmacy. Pt scheduled to see Dr. Henrene Pastor 09/16/20@8 :40am. Pt aware.

## 2020-08-27 NOTE — Telephone Encounter (Signed)
Pt states She is having a diverticulitis flare. Reports she started having some discomfort on Wednesday. Reports she is now having LLQ pain and feels like her left side is bulging out some. She has no fever and she is wearing loose clothing due to the discomfort. Please advise.

## 2020-08-27 NOTE — Telephone Encounter (Signed)
Prescribe Levaquin 250 mg bid for 7 days; #14; no refills Low residue diet until feeling better Office follow up with APP or me in 1-2 weeks Extra strength Tylenol as needed for pain

## 2020-09-07 ENCOUNTER — Other Ambulatory Visit: Payer: Self-pay | Admitting: Internal Medicine

## 2020-09-08 DIAGNOSIS — H6522 Chronic serous otitis media, left ear: Secondary | ICD-10-CM | POA: Diagnosis not present

## 2020-09-08 DIAGNOSIS — H6122 Impacted cerumen, left ear: Secondary | ICD-10-CM | POA: Diagnosis not present

## 2020-09-08 DIAGNOSIS — Z9622 Myringotomy tube(s) status: Secondary | ICD-10-CM | POA: Diagnosis not present

## 2020-09-09 DIAGNOSIS — R69 Illness, unspecified: Secondary | ICD-10-CM | POA: Diagnosis not present

## 2020-09-15 ENCOUNTER — Other Ambulatory Visit: Payer: Self-pay | Admitting: Endocrinology

## 2020-09-15 DIAGNOSIS — R69 Illness, unspecified: Secondary | ICD-10-CM | POA: Diagnosis not present

## 2020-09-16 ENCOUNTER — Ambulatory Visit: Payer: Medicare HMO | Admitting: Internal Medicine

## 2020-09-16 ENCOUNTER — Other Ambulatory Visit (INDEPENDENT_AMBULATORY_CARE_PROVIDER_SITE_OTHER): Payer: Medicare HMO

## 2020-09-16 ENCOUNTER — Encounter: Payer: Self-pay | Admitting: Internal Medicine

## 2020-09-16 VITALS — BP 127/70 | HR 69 | Ht 65.0 in | Wt 165.2 lb

## 2020-09-16 DIAGNOSIS — K219 Gastro-esophageal reflux disease without esophagitis: Secondary | ICD-10-CM | POA: Diagnosis not present

## 2020-09-16 DIAGNOSIS — K5732 Diverticulitis of large intestine without perforation or abscess without bleeding: Secondary | ICD-10-CM

## 2020-09-16 DIAGNOSIS — R1032 Left lower quadrant pain: Secondary | ICD-10-CM

## 2020-09-16 DIAGNOSIS — R194 Change in bowel habit: Secondary | ICD-10-CM | POA: Diagnosis not present

## 2020-09-16 LAB — BASIC METABOLIC PANEL
BUN: 10 mg/dL (ref 6–23)
CO2: 28 mEq/L (ref 19–32)
Calcium: 9.7 mg/dL (ref 8.4–10.5)
Chloride: 101 mEq/L (ref 96–112)
Creatinine, Ser: 0.63 mg/dL (ref 0.40–1.20)
GFR: 89.56 mL/min (ref >60.00)
Glucose, Bld: 124 mg/dL — ABNORMAL HIGH (ref 70–99)
Potassium: 3.8 mEq/L (ref 3.5–5.1)
Sodium: 137 mEq/L (ref 135–145)

## 2020-09-16 NOTE — Patient Instructions (Signed)
   Your provider has requested that you go to the basement level for lab work before leaving today. Press "B" on the elevator. The lab is located at the first door on the left as you exit the elevator.  Take 1-2 tablespoons of Citrucel daily.  You have been scheduled for a CT scan of the abdomen and pelvis at [facility], 1st floor Radiology. You are scheduled on 09/30/2020 at 10:00am. You should arrive 15 minutes prior to your appointment time for registration.  Please pick up 2 bottles of contrast from [place] [facility] at least 3 days prior to your scan. The solution may taste better if refrigerated, but do NOT add ice or any other liquid to this solution. Shake well before drinking.   Please follow the written instructions below on the day of your exam:   1) Do not eat anything after 6:00am (4 hours prior to your test)   2) Drink 1 bottle of contrast @ 8:00am (2 hours prior to your exam)  Remember to shake well before drinking and do NOT pour over ice.     Drink 1 bottle of contrast @ 9:00am (1 hour prior to your exam)   You may take any medications as prescribed with a small amount of water, if necessary. If you take any of the following medications: METFORMIN, GLUCOPHAGE, GLUCOVANCE, AVANDAMET, RIOMET, FORTAMET, [place] MET, JANUMET, GLUMETZA or METAGLIP, you MAY be asked to HOLD this medication 48 hours AFTER the exam.   The purpose of you drinking the oral contrast is to aid in the visualization of your intestinal tract. The contrast solution may cause some diarrhea. Depending on your individual set of symptoms, you may also receive an intravenous injection of x-ray contrast/dye. Plan on being at [facility] for 45 minutes or longer, depending on the type of exam you are having performed.   If you have any questions regarding your exam or if you need to reschedule, you may call [facility] at [phone] between the hours of 8:00 am and 5:00 pm, Monday-Friday.

## 2020-09-16 NOTE — Progress Notes (Signed)
HISTORY OF PRESENT ILLNESS:  Sonya Brady is a 69 y.o. female with a history of CT documented diverticulitis (February 2019) who recently contacted the office with complaints of lower abdominal discomfort which she thought was secondary to diverticulitis.  She was placed on Levaquin for 7 days.  This follow-up arranged.  She reports that her significant abdominal pain has resolved.  She now describes some vague left-sided abdominal discomfort.  Also reports change in bowel habits as alternating over the past several days.  No fevers or bleeding.  No nausea or vomiting.  Some bloating.  CT scan February 2019 documented diverticulitis.  Last colonoscopy April 2018 revealed 2 diminutive polyps and diverticulosis.  Polyps were not adenomatous.  Follow-up in 10 years recommended.  REVIEW OF SYSTEMS:  All non-GI ROS negative as otherwise stated in the HPI except for hearing problems  Past Medical History:  Diagnosis Date  . Acne   . Allergy   . Breast mass, right   . Cataract   . Cholelithiasis   . Depression    situational when husband was sick and dying.  . Diverticulosis   . GERD (gastroesophageal reflux disease)   . H. pylori infection   . Hypercholesteremia   . Hypertension   . Hypothyroidism     Past Surgical History:  Procedure Laterality Date  . ABDOMINAL HYSTERECTOMY     both ovary intact  . BREAST LUMPECTOMY Bilateral   . carpel tunnel surgery Right   . TYMPANOPLASTY     tube left ear    Social History Sonya Brady  reports that she has never smoked. She has never used smokeless tobacco. She reports that she does not drink alcohol and does not use drugs.  family history includes Hypertension in her mother; Other (age of onset: 55) in her father; Other (age of onset: 39) in her mother; Thyroid disease in her brother and sister.  Allergies  Allergen Reactions  . No Known Allergies        PHYSICAL EXAMINATION: Vital signs: BP 127/70   Pulse 69   Ht 5\' 5"  (1.651  m)   Wt 165 lb 3.2 oz (74.9 kg)   SpO2 98%   BMI 27.49 kg/m   Constitutional: generally well-appearing, no acute distress Psychiatric: alert and oriented x3, cooperative Eyes: extraocular movements intact, anicteric, conjunctiva pink Mouth: oral pharynx moist, no lesions Neck: supple no lymphadenopathy Cardiovascular: heart regular rate and rhythm, no murmur Lungs: clear to auscultation bilaterally Abdomen: soft, nontender, nondistended, no obvious ascites, no peritoneal signs, normal bowel sounds, no organomegaly Rectal: Omitted Extremities: no clubbing, cyanosis, or lower extremity edema bilaterally Skin: no lesions on visible extremities Neuro: No focal deficits.  Cranial nerves intact  ASSESSMENT:  1.  Recent bout of abdominal pain possible diverticulitis.  Improved with antibiotics. 2.  Residual vague left-sided discomfort and alternating bowel habits.  Benign abdominal exam.  Rule out residual diverticulitis. 3.  Complete colonoscopy April 2018.  No neoplasia 4.  GERD complicated by peptic stricture.  Asymptomatic post dilation on PPI   PLAN:  1.  Recommend Citrucel 1 to 2 tablespoons daily for alternating bowel habits 2.  Schedule CT scan of the abdomen pelvis to rule out residual diverticulitis that might require additional antibiotic therapy. 3.  Routine screening colonoscopy around 2028 4.  Continue reflux precautions and PPI 5.  Routine GI follow-up 1 year unless otherwise indicated

## 2020-09-20 DIAGNOSIS — R69 Illness, unspecified: Secondary | ICD-10-CM | POA: Diagnosis not present

## 2020-09-30 ENCOUNTER — Other Ambulatory Visit: Payer: Self-pay

## 2020-09-30 ENCOUNTER — Encounter (HOSPITAL_COMMUNITY): Payer: Self-pay

## 2020-09-30 ENCOUNTER — Ambulatory Visit (HOSPITAL_COMMUNITY)
Admission: RE | Admit: 2020-09-30 | Discharge: 2020-09-30 | Disposition: A | Discharge status: Home / Self Care | Payer: Medicare HMO | Source: Ambulatory Visit | Attending: Internal Medicine | Admitting: Internal Medicine

## 2020-09-30 DIAGNOSIS — R1032 Left lower quadrant pain: Secondary | ICD-10-CM | POA: Insufficient documentation

## 2020-09-30 DIAGNOSIS — R109 Unspecified abdominal pain: Secondary | ICD-10-CM | POA: Diagnosis not present

## 2020-09-30 MED ORDER — IOHEXOL 300 MG/ML  SOLN
100.0000 mL | Freq: Once | INTRAMUSCULAR | Status: AC | PRN
  Administered 2020-09-30: 100 mL via INTRAVENOUS

## 2020-10-29 ENCOUNTER — Ambulatory Visit: Payer: Medicare HMO | Admitting: Endocrinology

## 2020-11-01 ENCOUNTER — Other Ambulatory Visit: Payer: Self-pay | Admitting: Internal Medicine

## 2020-11-12 DIAGNOSIS — H6522 Chronic serous otitis media, left ear: Secondary | ICD-10-CM | POA: Diagnosis not present

## 2020-11-12 DIAGNOSIS — Z9622 Myringotomy tube(s) status: Secondary | ICD-10-CM | POA: Diagnosis not present

## 2020-11-12 DIAGNOSIS — H6122 Impacted cerumen, left ear: Secondary | ICD-10-CM | POA: Diagnosis not present

## 2020-11-12 DIAGNOSIS — H7312 Chronic myringitis, left ear: Secondary | ICD-10-CM | POA: Diagnosis not present

## 2020-11-23 ENCOUNTER — Ambulatory Visit: Payer: Medicare HMO | Admitting: Endocrinology

## 2020-11-29 DIAGNOSIS — H7312 Chronic myringitis, left ear: Secondary | ICD-10-CM | POA: Diagnosis not present

## 2020-11-29 DIAGNOSIS — H6122 Impacted cerumen, left ear: Secondary | ICD-10-CM | POA: Diagnosis not present

## 2020-12-08 ENCOUNTER — Other Ambulatory Visit: Payer: Self-pay | Admitting: Internal Medicine

## 2020-12-13 DIAGNOSIS — H7312 Chronic myringitis, left ear: Secondary | ICD-10-CM | POA: Diagnosis not present

## 2020-12-27 ENCOUNTER — Other Ambulatory Visit: Payer: Self-pay

## 2020-12-27 ENCOUNTER — Ambulatory Visit: Payer: Medicare HMO | Admitting: Endocrinology

## 2020-12-27 VITALS — BP 150/82 | HR 76 | Ht 65.5 in | Wt 167.4 lb

## 2020-12-27 DIAGNOSIS — E118 Type 2 diabetes mellitus with unspecified complications: Secondary | ICD-10-CM

## 2020-12-27 LAB — POCT GLYCOSYLATED HEMOGLOBIN (HGB A1C): Hemoglobin A1C: 5.6 % (ref 4.0–5.6)

## 2020-12-27 NOTE — Patient Instructions (Addendum)
Your blood pressure is high today.  Please see your primary care provider soon, to have it rechecked Please continue the same metformin.   Please come back for a follow-up appointment in 6 months.

## 2020-12-27 NOTE — Progress Notes (Signed)
Subjective:    Patient ID: Sonya Brady, female    DOB: 07/03/1951, 69 y.o.   MRN: [id number]  HPI Pt returns for f/u of diabetes mellitus: DM type: 2 Dx'ed: 0240 Complications: none Therapy: metformin. GDM: never DKA: never Severe hypoglycemia: never Pancreatitis: never Pancreatic imaging: normal on 2019 CT.   Other: pt states she feels well in general.  She says cbg's are in the low-100's.   She also has hypothyroidism (dx'ed 64; [place] in 2020 showed heterogeneous enlarged thyroid compatible with small goiter--bx or f/u not needed).    Past Medical History:  Diagnosis Date   Acne    Allergy    Breast mass, right    Cataract    Cholelithiasis    Depression    situational when husband was sick and dying.   Diverticulosis    GERD (gastroesophageal reflux disease)    H. pylori infection    Hypercholesteremia    Hypertension    Hypothyroidism     Past Surgical History:  Procedure Laterality Date   ABDOMINAL HYSTERECTOMY     both ovary intact   BREAST LUMPECTOMY Bilateral    carpel tunnel surgery Right    TYMPANOPLASTY     tube left ear    Social History   Socioeconomic History   Marital status: Single    Spouse name: Not on file   Number of children: 2   Years of education: Not on file   Highest education level: Not on file  Occupational History   Occupation: bookkeeper  Tobacco Use   Smoking status: Never Smoker   Smokeless tobacco: Never Used  Vaping Use   Vaping Use: Never used  Substance and Sexual Activity   Alcohol use: No   Drug use: No   Sexual activity: Not on file  Other Topics Concern   Not on file  Social History Narrative   Married '72-widowed '97   HSG   1 daughter '81, 1 son '83: son going thru divorce ['09]; applying to medical school   Work: bookkeeper [employer]   Social Determinants of Radio broadcast assistant Strain: Not on file  Food Insecurity: Not on file  Transportation Needs: Not on  file  Physical Activity: Not on file  Stress: Not on file  Social Connections: Not on file  Intimate Partner Violence: Not on file    Current Outpatient Medications on File Prior to Visit  Medication Sig Dispense Refill   atorvastatin (LIPITOR) 80 MG tablet Take 1 tablet by mouth once daily 90 tablet 0   Calcium Carbonate-Vitamin D (CALCIUM + D PO) Take by mouth.     dorzolamide-timolol (COSOPT) 22.3-6.8 MG/ML ophthalmic solution Place 1 drop into both eyes 2 (two) times daily.     latanoprost (XALATAN) 0.005 % ophthalmic solution Place 1 drop into both eyes at bedtime.     levothyroxine (EUTHYROX) 112 MCG tablet Take 1 tablet (112 mcg total) by mouth daily before breakfast. 90 tablet 3   lisinopril-hydrochlorothiazide (ZESTORETIC) 20-25 MG tablet Take 1 tablet by mouth once daily 90 tablet 0   metFORMIN (GLUCOPHAGE) 500 MG tablet Take 1 tablet (500 mg total) by mouth 2 (two) times daily with a meal. 180 tablet 3   Multiple Vitamins-Minerals (MULTIVITAMIN PO) Take by mouth.     Omega-3 Fatty Acids (FISH OIL PO) Take by mouth.     omeprazole (PRILOSEC) 20 MG capsule Take 1 capsule (20 mg total) by mouth daily. 90 capsule 3   ondansetron (ZOFRAN-ODT)  4 MG disintegrating tablet Take 1 tablet by mouth as needed.     OneTouch Delica Lancets 49S MISC USE TO CHECK SUGARS ONCE DAILY 100 each 0   ONETOUCH VERIO test strip USE 1 STRIP TO CHECK BLOOD SUGARS ONCE DAILY 75 each 0   timolol (BETIMOL) 0.5 % ophthalmic solution 1 drop 2 (two) times daily.     No current facility-administered medications on file prior to visit.    Allergies  Allergen Reactions   No Known Allergies     Family History  Problem Relation Age of Onset   Other Father 68       deceased, unknown causes in 01-26-2023   Hypertension Mother    Other Mother 74       SBO, deceased   Thyroid disease Brother    Thyroid disease Sister    Breast cancer Neg Hx    Colon cancer Neg Hx    Esophageal cancer Neg  Hx    Rectal cancer Neg Hx    Stomach cancer Neg Hx    Diabetes Neg Hx     BP (!) 150/82 (BP Location: Right Arm, Patient Position: Sitting, Cuff Size: Normal)    Pulse 76    Ht 5' 5.5" (1.664 m)    Wt 167 lb 6.4 oz (75.9 kg)    SpO2 98%    BMI 27.43 kg/m    Review of Systems     Objective:   Physical Exam VITAL SIGNS:  See vs page GENERAL: no distress NECK: thyroid is 2-3 times normal size (R>L).  Surface is irreg, but no palpable nodule.     Lab Results  Component Value Date   HGBA1C 5.6 2021-01-25   Lab Results  Component Value Date   TSH 2.15 06/17/2020       Assessment & Plan:  Goiter: clinically stable.  We'll follow.  Type 2 DM: well-controlled HTN: is noted today.    Patient Instructions  Your blood pressure is high today.  Please see your primary care provider soon, to have it rechecked Please continue the same metformin.   Please come back for a follow-up appointment in 6 months.

## 2021-01-03 DIAGNOSIS — H7312 Chronic myringitis, left ear: Secondary | ICD-10-CM | POA: Diagnosis not present

## 2021-01-05 ENCOUNTER — Other Ambulatory Visit: Payer: Self-pay | Admitting: Internal Medicine

## 2021-01-10 DIAGNOSIS — Z1231 Encounter for screening mammogram for malignant neoplasm of breast: Secondary | ICD-10-CM | POA: Diagnosis not present

## 2021-01-10 DIAGNOSIS — N8110 Cystocele, unspecified: Secondary | ICD-10-CM | POA: Diagnosis not present

## 2021-01-10 DIAGNOSIS — N816 Rectocele: Secondary | ICD-10-CM | POA: Diagnosis not present

## 2021-01-10 DIAGNOSIS — Z01419 Encounter for gynecological examination (general) (routine) without abnormal findings: Secondary | ICD-10-CM | POA: Diagnosis not present

## 2021-01-13 ENCOUNTER — Ambulatory Visit (INDEPENDENT_AMBULATORY_CARE_PROVIDER_SITE_OTHER): Payer: Medicare HMO | Admitting: Internal Medicine

## 2021-01-13 ENCOUNTER — Other Ambulatory Visit: Payer: Self-pay

## 2021-01-13 ENCOUNTER — Encounter: Payer: Self-pay | Admitting: Internal Medicine

## 2021-01-13 VITALS — BP 130/72 | HR 78 | Temp 98.2°F | Resp 18 | Ht 65.5 in | Wt 166.0 lb

## 2021-01-13 DIAGNOSIS — Z0000 Encounter for general adult medical examination without abnormal findings: Secondary | ICD-10-CM

## 2021-01-13 DIAGNOSIS — I10 Essential (primary) hypertension: Secondary | ICD-10-CM

## 2021-01-13 DIAGNOSIS — E118 Type 2 diabetes mellitus with unspecified complications: Secondary | ICD-10-CM | POA: Diagnosis not present

## 2021-01-13 DIAGNOSIS — E039 Hypothyroidism, unspecified: Secondary | ICD-10-CM | POA: Diagnosis not present

## 2021-01-13 DIAGNOSIS — E785 Hyperlipidemia, unspecified: Secondary | ICD-10-CM

## 2021-01-13 DIAGNOSIS — E1169 Type 2 diabetes mellitus with other specified complication: Secondary | ICD-10-CM

## 2021-01-13 LAB — T4, FREE: Free T4: 1.14 ng/dL (ref 0.60–1.60)

## 2021-01-13 LAB — COMPREHENSIVE METABOLIC PANEL
ALT: 13 U/L (ref 0–35)
AST: 14 U/L (ref 0–37)
Albumin: 4.6 g/dL (ref 3.5–5.2)
Alkaline Phosphatase: 79 U/L (ref 39–117)
BUN: 15 mg/dL (ref 6–23)
CO2: 28 mEq/L (ref 19–32)
Calcium: 9.6 mg/dL (ref 8.4–10.5)
Chloride: 103 mEq/L (ref 96–112)
Creatinine, Ser: 0.64 mg/dL (ref 0.40–1.20)
GFR: 89.01 mL/min (ref >60.00)
Glucose, Bld: 92 mg/dL (ref 70–99)
Potassium: 3.8 mEq/L (ref 3.5–5.1)
Sodium: 138 mEq/L (ref 135–145)
Total Bilirubin: 0.5 mg/dL (ref 0.2–1.2)
Total Protein: 7.6 g/dL (ref 6.0–8.3)

## 2021-01-13 LAB — CBC
HCT: 39.9 % (ref 36.0–46.0)
Hemoglobin: 13.4 g/dL (ref 12.0–15.0)
MCHC: 33.7 g/dL (ref 30.0–36.0)
MCV: 87.8 fl (ref 78.0–100.0)
Platelets: 205 10*3/uL (ref 150.0–400.0)
RBC: 4.54 Mil/uL (ref 3.87–5.11)
RDW: 13.6 % (ref 11.5–15.5)
WBC: 4.7 10*3/uL (ref 4.0–10.5)

## 2021-01-13 LAB — LIPID PANEL
Cholesterol: 156 mg/dL (ref 0–200)
HDL: 49.9 mg/dL (ref >39.00)
LDL Cholesterol: 83 mg/dL (ref 0–99)
NonHDL: 106.02
Total CHOL/HDL Ratio: 3
Triglycerides: 115 mg/dL (ref 0.0–149.0)
VLDL: 23 mg/dL (ref 0.0–40.0)

## 2021-01-13 LAB — TSH: TSH: 2.04 u[IU]/mL (ref 0.35–4.50)

## 2021-01-13 MED ORDER — LISINOPRIL-HYDROCHLOROTHIAZIDE 20-25 MG PO TABS: 1.0000 | ORAL_TABLET | Freq: Every day | ORAL | 3 refills | Status: DC

## 2021-01-13 NOTE — Progress Notes (Signed)
Subjective:   Patient ID: Sonya Brady, female    DOB: [birth year], 70 y.o.   MRN: [id number]  HPI Here for medicare wellness and physical, no new complaints. Please see A/P for status and treatment of chronic medical problems.   Diet: DM since diabetic Physical activity: sedentary Depression/mood screen: negative Hearing: intact to whispered voice, mild loss left ear Visual acuity: grossly normal, performs annual eye exam  ADLs: capable Fall risk: none Home safety: good Cognitive evaluation: intact to orientation, naming, recall and repetition EOL planning: adv directives discussed  [place] Visit from 08-Jan-2019 in [place]  PHQ-2 Total Score 0      I have personally reviewed and have noted 1. The patient's medical and social history - reviewed today no changes 2. Their use of alcohol, tobacco or illicit drugs 3. Their current medications and supplements 4. The patient's functional ability including ADL's, fall risks, home safety risks and hearing or visual impairment. 5. Diet and physical activities 6. Evidence for depression or mood disorders 7. Care team reviewed and updated 8.  The patient is on an opioid pain medication and this was reviewed with patient and non-opioid pain medication options were reviewed and offered to patient, their pain treatment plan and severity was discussed with them. We have considered referrals as appropriate for patient. Opioid risk factors were also considered and reviewed.   Patient Care Team: Hoyt Koch, MD as PCP - General (Internal Medicine) Sable Feil, MD (Gastroenterology) Past Medical History:  Diagnosis Date  . Acne   . Allergy   . Breast mass, right   . Cataract   . Cholelithiasis   . Depression    situational when husband was sick and dying.  . Diverticulosis   . GERD (gastroesophageal reflux disease)   . H. pylori infection   . Hypercholesteremia   . Hypertension    . Hypothyroidism    Past Surgical History:  Procedure Laterality Date  . ABDOMINAL HYSTERECTOMY     both ovary intact  . BREAST LUMPECTOMY Bilateral   . carpel tunnel surgery Right   . TYMPANOPLASTY     tube left ear   Family History  Problem Relation Age of Onset  . Other Father 35       deceased, unknown causes in Jan 08, 2023  . Hypertension Mother   . Other Mother 32       SBO, deceased  . Thyroid disease Brother   . Thyroid disease Sister   . Breast cancer Neg Hx   . Colon cancer Neg Hx   . Esophageal cancer Neg Hx   . Rectal cancer Neg Hx   . Stomach cancer Neg Hx   . Diabetes Neg Hx    Review of Systems  Constitutional: Negative.   HENT: Negative.   Eyes: Negative.   Respiratory: Negative for cough, chest tightness and shortness of breath.   Cardiovascular: Negative for chest pain, palpitations and leg swelling.  Gastrointestinal: Negative for abdominal distention, abdominal pain, constipation, diarrhea, nausea and vomiting.  Musculoskeletal: Negative.   Skin: Negative.   Neurological: Negative.   Psychiatric/Behavioral: Negative.     Objective:  Physical Exam Constitutional:      Appearance: She is well-developed.  HENT:     Head: Normocephalic and atraumatic.  Cardiovascular:     Rate and Rhythm: Normal rate and regular rhythm.  Pulmonary:     Effort: Pulmonary effort is normal. No respiratory distress.     Breath sounds:  Normal breath sounds. No wheezing or rales.  Abdominal:     General: Bowel sounds are normal. There is no distension.     Palpations: Abdomen is soft.     Tenderness: There is no abdominal tenderness. There is no rebound.  Musculoskeletal:     Cervical back: Normal range of motion.  Skin:    General: Skin is warm and dry.  Neurological:     Mental Status: She is alert and oriented to person, place, and time.     Coordination: Coordination normal.     Vitals:   01/13/21 1044  BP: 130/72  Pulse: 78  Resp: 18  Temp: 98.2 F  (36.8 C)  TempSrc: Oral  SpO2: 98%  Weight: 166 lb (75.3 kg)  Height: 5' 5.5" (1.664 m)   This visit occurred during the SARS-CoV-2 public health emergency.  Safety protocols were in place, including screening questions prior to the visit, additional usage of staff PPE, and extensive cleaning of exam room while observing appropriate contact time as indicated for disinfecting solutions.   Assessment & Plan:

## 2021-01-13 NOTE — Patient Instructions (Signed)
Health Maintenance, Female Adopting a healthy lifestyle and getting preventive care are important in promoting health and wellness. Ask your health care provider about:  The right schedule for you to have regular tests and exams.  Things you can do on your own to prevent diseases and keep yourself healthy. What should I know about diet, weight, and exercise? Eat a healthy diet  Eat a diet that includes plenty of vegetables, fruits, low-fat dairy products, and lean protein.  Do not eat a lot of foods that are high in solid fats, added sugars, or sodium.   Maintain a healthy weight Body mass index (BMI) is used to identify weight problems. It estimates body fat based on height and weight. Your health care provider can help determine your BMI and help you achieve or maintain a healthy weight. Get regular exercise Get regular exercise. This is one of the most important things you can do for your health. Most adults should:  Exercise for at least 150 minutes each week. The exercise should increase your heart rate and make you sweat (moderate-intensity exercise).  Do strengthening exercises at least twice a week. This is in addition to the moderate-intensity exercise.  Spend less time sitting. Even light physical activity can be beneficial. Watch cholesterol and blood lipids Have your blood tested for lipids and cholesterol at 71 years of age, then have this test every 5 years. Have your cholesterol levels checked more often if:  Your lipid or cholesterol levels are high.  You are older than 71 years of age.  You are at high risk for heart disease. What should I know about cancer screening? Depending on your health history and family history, you may need to have cancer screening at various ages. This may include screening for:  Breast cancer.  Cervical cancer.  Colorectal cancer.  Skin cancer.  Lung cancer. What should I know about heart disease, diabetes, and high blood  pressure? Blood pressure and heart disease  High blood pressure causes heart disease and increases the risk of stroke. This is more likely to develop in people who have high blood pressure readings, are of African descent, or are overweight.  Have your blood pressure checked: ? Every 3-5 years if you are 18-39 years of age. ? Every year if you are 40 years old or older. Diabetes Have regular diabetes screenings. This checks your fasting blood sugar level. Have the screening done:  Once every three years after age 40 if you are at a normal weight and have a low risk for diabetes.  More often and at a younger age if you are overweight or have a high risk for diabetes. What should I know about preventing infection? Hepatitis B If you have a higher risk for hepatitis B, you should be screened for this virus. Talk with your health care provider to find out if you are at risk for hepatitis B infection. Hepatitis C Testing is recommended for:  Everyone born from 1945 through 1965.  Anyone with known risk factors for hepatitis C. Sexually transmitted infections (STIs)  Get screened for STIs, including gonorrhea and chlamydia, if: ? You are sexually active and are younger than 71 years of age. ? You are older than 71 years of age and your health care provider tells you that you are at risk for this type of infection. ? Your sexual activity has changed since you were last screened, and you are at increased risk for chlamydia or gonorrhea. Ask your health care provider   if you are at risk.  Ask your health care provider about whether you are at high risk for HIV. Your health care provider may recommend a prescription medicine to help prevent HIV infection. If you choose to take medicine to prevent HIV, you should first get tested for HIV. You should then be tested every 3 months for as long as you are taking the medicine. Pregnancy  If you are about to stop having your period (premenopausal) and  you may become pregnant, seek counseling before you get pregnant.  Take 400 to 800 micrograms (mcg) of folic acid every day if you become pregnant.  Ask for birth control (contraception) if you want to prevent pregnancy. Osteoporosis and menopause Osteoporosis is a disease in which the bones lose minerals and strength with aging. This can result in bone fractures. If you are 65 years old or older, or if you are at risk for osteoporosis and fractures, ask your health care provider if you should:  Be screened for bone loss.  Take a calcium or vitamin D supplement to lower your risk of fractures.  Be given hormone replacement therapy (HRT) to treat symptoms of menopause. Follow these instructions at home: Lifestyle  Do not use any products that contain nicotine or tobacco, such as cigarettes, e-cigarettes, and chewing tobacco. If you need help quitting, ask your health care provider.  Do not use street drugs.  Do not share needles.  Ask your health care provider for help if you need support or information about quitting drugs. Alcohol use  Do not drink alcohol if: ? Your health care provider tells you not to drink. ? You are pregnant, may be pregnant, or are planning to become pregnant.  If you drink alcohol: ? Limit how much you use to 0-1 drink a day. ? Limit intake if you are breastfeeding.  Be aware of how much alcohol is in your drink. In the [place] one drink equals one 12 oz bottle of beer (355 mL), one 5 oz glass of wine (148 mL), or one 1 oz glass of hard liquor (44 mL). General instructions  Schedule regular health, dental, and eye exams.  Stay current with your vaccines.  Tell your health care provider if: ? You often feel depressed. ? You have ever been abused or do not feel safe at home. Summary  Adopting a healthy lifestyle and getting preventive care are important in promoting health and wellness.  Follow your health care provider's instructions about healthy  diet, exercising, and getting tested or screened for diseases.  Follow your health care provider's instructions on monitoring your cholesterol and blood pressure. This information is not intended to replace advice given to you by your health care provider. Make sure you discuss any questions you have with your health care provider. Document Revised: 09/18/2018 Document Reviewed: 09/18/2018 Elsevier Patient Education  2021 [employer]vier Inc.  

## 2021-01-14 NOTE — Assessment & Plan Note (Signed)
Checking lipid panel and adjust simvastatin 80 mg daily.

## 2021-01-14 NOTE — Assessment & Plan Note (Signed)
Good control. Foot exam up to date. Eye exam upcoming and up to date. Taking metformin and on ACE-I and statin.

## 2021-01-14 NOTE — Assessment & Plan Note (Signed)
BP at goal on lisinopril/hctz. Checking labs and adjust as needed.

## 2021-01-14 NOTE — Assessment & Plan Note (Signed)
Flu shot counseled. Covid-19 up to date including booster. Pneumonia complete. Shingrix counseled. Tetanus due advised to get at pharmacy. Colonoscopy due 2028. Mammogram done with ob/gyn recently due 2024, pap smear aged out and dexa due with ob/gyn. Counseled about sun safety and mole surveillance. Counseled about the dangers of distracted driving. Given 10 year screening recommendations.

## 2021-01-14 NOTE — Assessment & Plan Note (Signed)
Checking TSH and free T4 and adjust synthoid 112 mcg daily.

## 2021-02-23 DIAGNOSIS — Z9622 Myringotomy tube(s) status: Secondary | ICD-10-CM | POA: Diagnosis not present

## 2021-02-23 DIAGNOSIS — H7312 Chronic myringitis, left ear: Secondary | ICD-10-CM | POA: Diagnosis not present

## 2021-02-28 ENCOUNTER — Telehealth: Payer: Self-pay | Admitting: Internal Medicine

## 2021-02-28 NOTE — Progress Notes (Signed)
  Chronic Care Management   Note  02/28/2021 Name: SHAQUASHA GERSTEL MRN: [id number] DOB: October 15, 1950  INDIRA SORENSON is a 70 y.o. year old female who is a primary care patient of Sharlet Salina Real Cons, MD. I reached out to Cherlynn Perches by phone today in response to a referral sent by Ms. Merrily Brittle Jahr's PCP, Hoyt Koch, MD.   Ms. Mahn was given information about Chronic Care Management services today including:  1. CCM service includes personalized support from designated clinical staff supervised by her physician, including individualized plan of care and coordination with other care providers 2. 24/7 contact phone numbers for assistance for urgent and routine care needs. 3. Service will only be billed when office clinical staff spend 20 minutes or more in a month to coordinate care. 4. Only one practitioner may furnish and bill the service in a calendar month. 5. The patient may stop CCM services at any time (effective at the end of the month) by phone call to the office staff.   Patient wishes to consider information provided and/or speak with a member of the care team before deciding about enrollment in care management services.   Follow up plan:  [place]

## 2021-03-09 ENCOUNTER — Other Ambulatory Visit: Payer: Self-pay | Admitting: Internal Medicine

## 2021-03-16 ENCOUNTER — Other Ambulatory Visit: Payer: Self-pay | Admitting: Endocrinology

## 2021-04-06 DIAGNOSIS — H40023 Open angle with borderline findings, high risk, bilateral: Secondary | ICD-10-CM | POA: Diagnosis not present

## 2021-04-06 DIAGNOSIS — H524 Presbyopia: Secondary | ICD-10-CM | POA: Diagnosis not present

## 2021-04-06 DIAGNOSIS — E119 Type 2 diabetes mellitus without complications: Secondary | ICD-10-CM | POA: Diagnosis not present

## 2021-04-06 DIAGNOSIS — H43813 Vitreous degeneration, bilateral: Secondary | ICD-10-CM | POA: Diagnosis not present

## 2021-04-06 LAB — HM DIABETES EYE EXAM

## 2021-04-09 IMAGING — CT CT ABD-PELV W/ CM
2 of 5 series · 16 of 46 positions shown, 18 images · IV contrast (OMNIPAQUE)
Comparison: 11/14/2017

CLINICAL DATA: Abdominal pain, follow-up diverticulitis

EXAM:
CT ABDOMEN AND PELVIS WITH CONTRAST
TECHNIQUE: Multidetector CT imaging of the abdomen and pelvis was performed
using the standard protocol following bolus administration of
intravenous contrast.
CONTRAST:  100mL OMNIPAQUE IOHEXOL 300 MG/ML SOLN, additional oral
enteric contrast

[Series 2: axial st · axial · 0.66mm/px · z∈[-423,-63]mm · 13 of 86 slices shown, 15 images]
[im 7/86  soft-tissue]
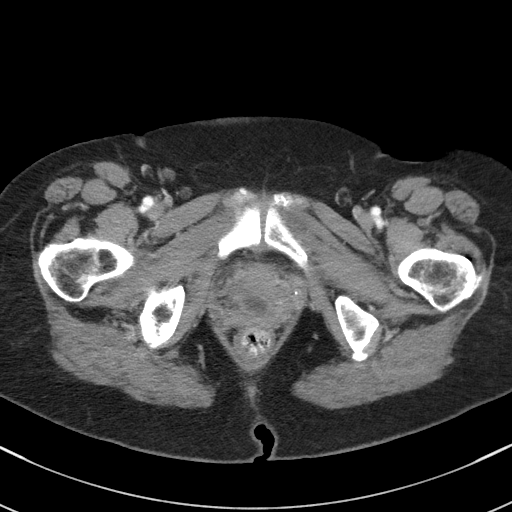
[im 7/86  bone]
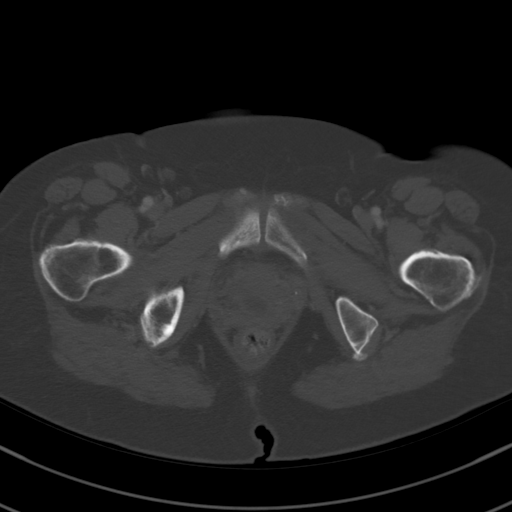
[im 13/86  soft-tissue]
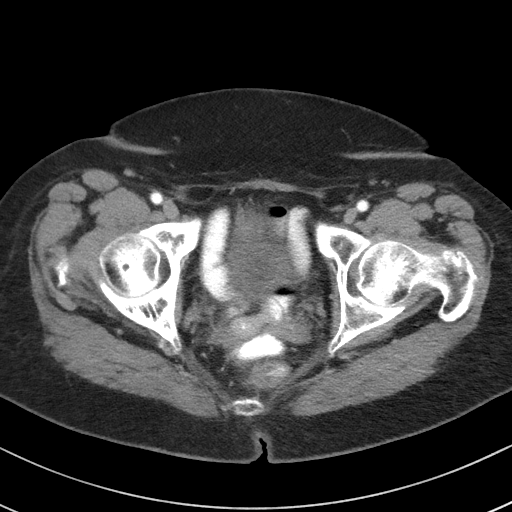
[im 19/86  soft-tissue]
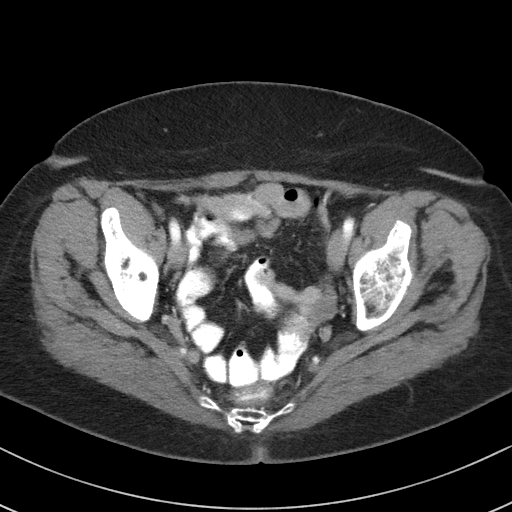
[im 25/86  soft-tissue]
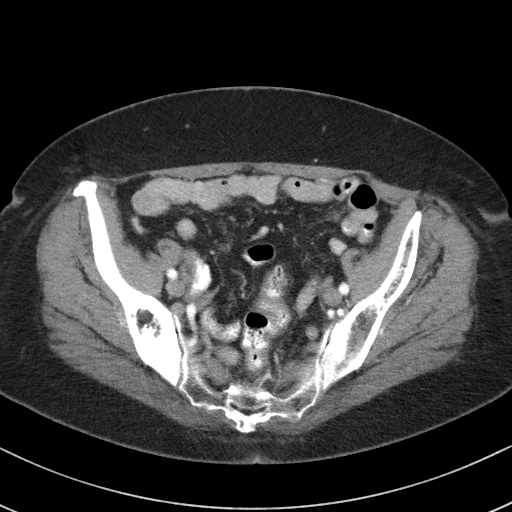
[im 31/86  soft-tissue]
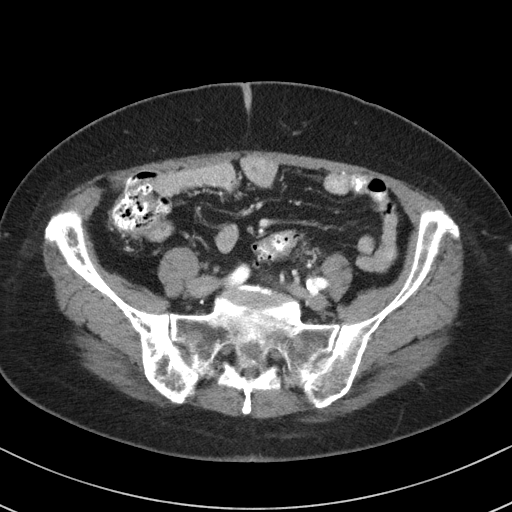
[im 37/86  soft-tissue]
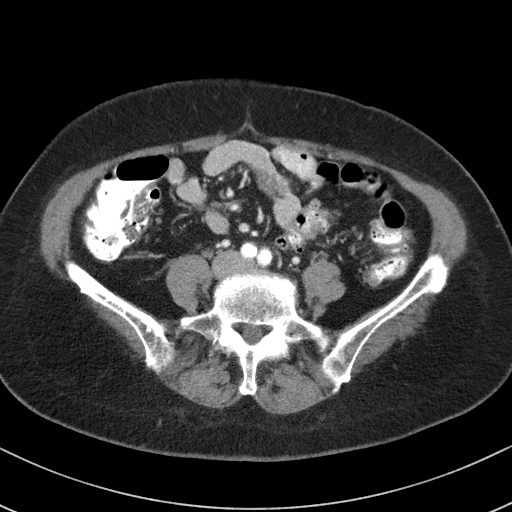
[im 43/86  soft-tissue]
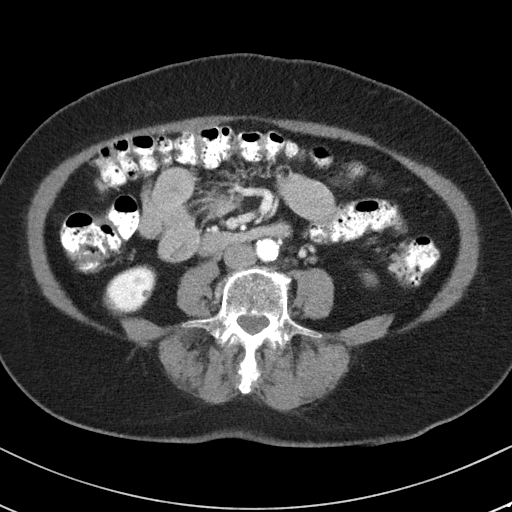
[im 49/86  soft-tissue]
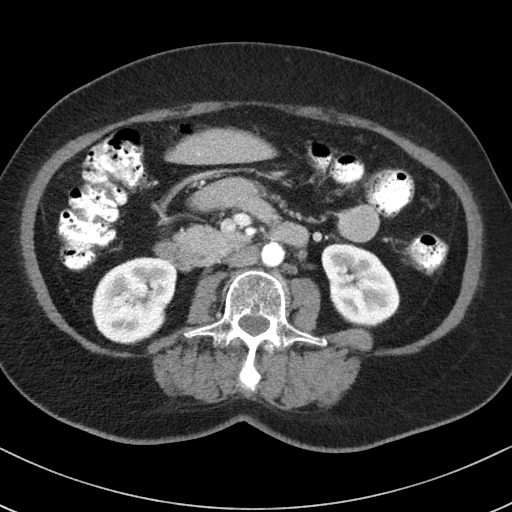
[im 55/86  soft-tissue]
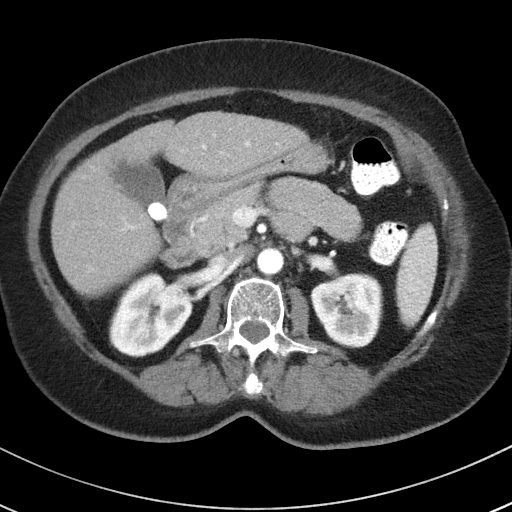
[im 55/86  bone]
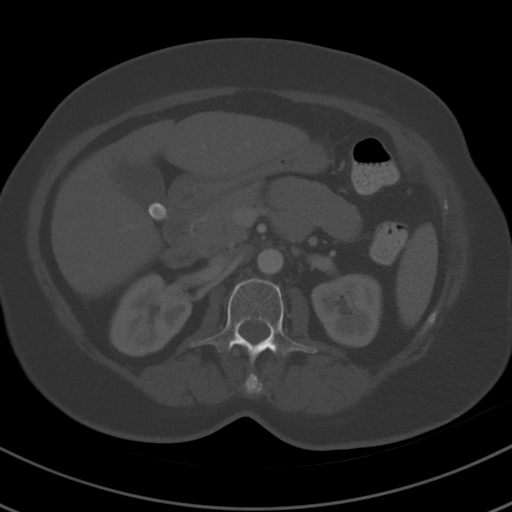
[im 61/86  soft-tissue]
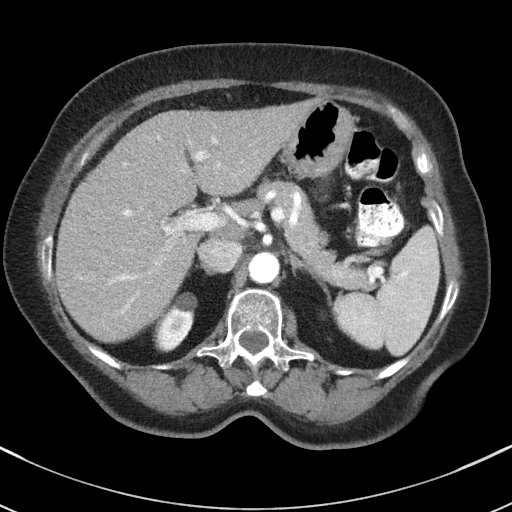
[im 67/86  soft-tissue]
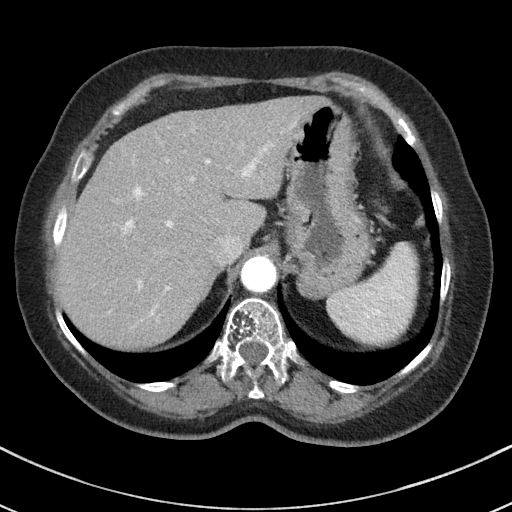
[im 73/86  soft-tissue]
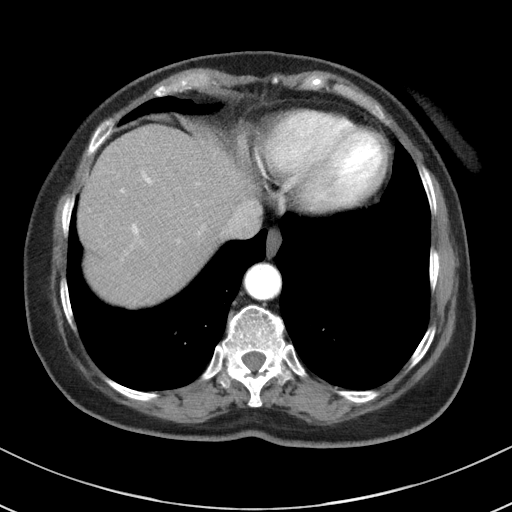
[im 79/86  soft-tissue]
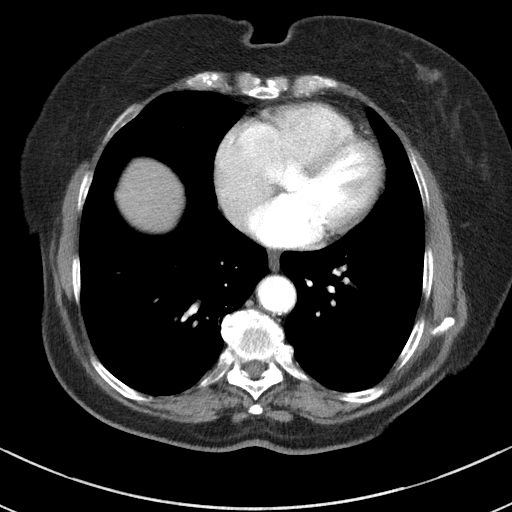

[Series 4: coronal st · coronal · 0.69mm/px · 3 of 89 slices shown]
[im 30/89  soft-tissue]
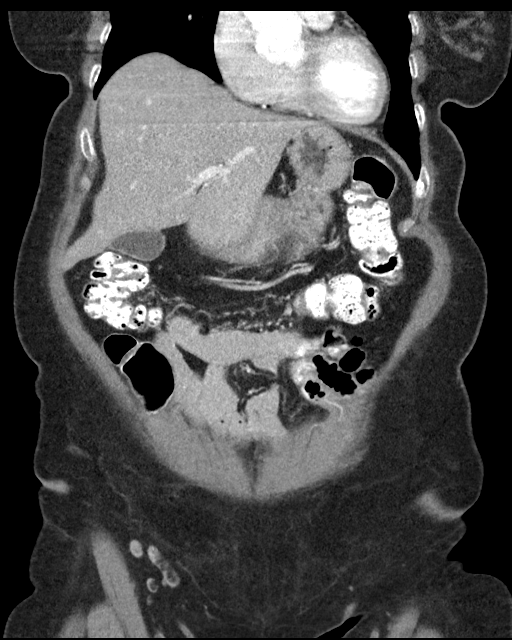
[im 40/89  soft-tissue]
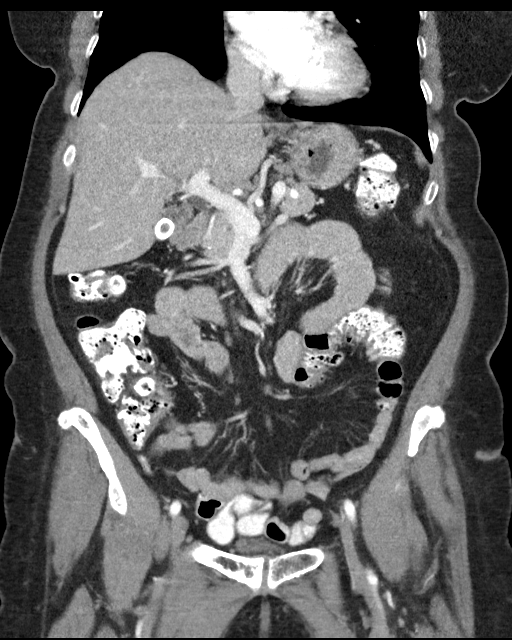
[im 49/89  soft-tissue]
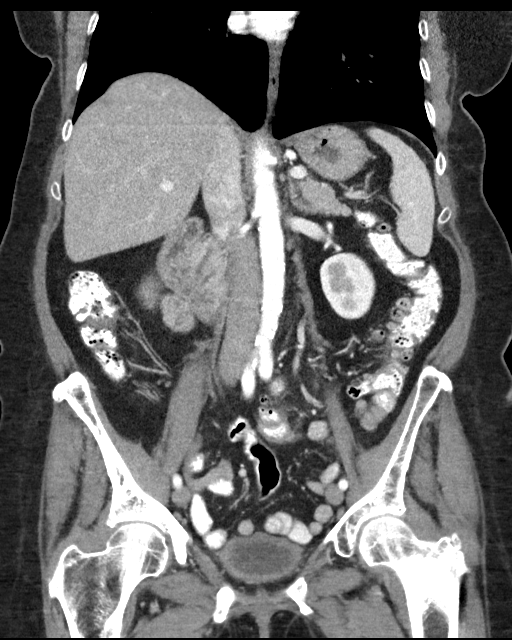

[16 of 46 positions shown; findings below may reference images not displayed]

FINDINGS: Lower chest: No acute abnormality.

Hepatobiliary: No solid liver abnormality is seen. Gallstone in the
gallbladder. Gallbladder wall thickening, or biliary dilatation.

Pancreas: Unremarkable. No pancreatic ductal dilatation or
surrounding inflammatory changes.

Spleen: Normal in size without significant abnormality.

Adrenals/Urinary Tract: Adrenal glands are unremarkable. Kidneys are
normal, without renal calculi, solid lesion, or hydronephrosis.
Bladder is unremarkable.

Stomach/Bowel: Stomach is within normal limits. Appendix appears
normal. No evidence of bowel wall thickening, distention, or
inflammatory changes. Descending and sigmoid diverticulosis.

Vascular/Lymphatic: Aortic atherosclerosis. No enlarged abdominal or
pelvic lymph nodes.

Reproductive: Status post hysterectomy.

Other: No abdominal wall hernia or abnormality. No abdominopelvic
ascites.

Musculoskeletal: No acute or significant osseous findings. There is
asymmetric, diffuse sclerosis of the right hemipelvis, unchanged
compared to prior examinations and likely reflecting monostotic
Paget's disease.
IMPRESSION: 1. Descending and sigmoid diverticulosis without evidence of acute
diverticulitis.
2. No acute CT findings of the abdomen or pelvis to explain pain.
3. Cholelithiasis.
4. Status post hysterectomy.

Aortic Atherosclerosis (OBTQS-LC1.1).

## 2021-04-25 ENCOUNTER — Other Ambulatory Visit: Payer: Self-pay | Admitting: Internal Medicine

## 2021-05-20 ENCOUNTER — Other Ambulatory Visit: Payer: Self-pay | Admitting: Endocrinology

## 2021-05-20 DIAGNOSIS — E118 Type 2 diabetes mellitus with unspecified complications: Secondary | ICD-10-CM

## 2021-05-30 DIAGNOSIS — Z9622 Myringotomy tube(s) status: Secondary | ICD-10-CM | POA: Diagnosis not present

## 2021-05-30 DIAGNOSIS — H7312 Chronic myringitis, left ear: Secondary | ICD-10-CM | POA: Diagnosis not present

## 2021-06-12 ENCOUNTER — Other Ambulatory Visit: Payer: Self-pay | Admitting: Internal Medicine

## 2021-06-29 ENCOUNTER — Ambulatory Visit (INDEPENDENT_AMBULATORY_CARE_PROVIDER_SITE_OTHER): Payer: Medicare HMO | Admitting: Endocrinology

## 2021-06-29 ENCOUNTER — Other Ambulatory Visit: Payer: Self-pay

## 2021-06-29 VITALS — BP 140/80 | HR 84 | Ht 65.5 in | Wt 163.0 lb

## 2021-06-29 DIAGNOSIS — E118 Type 2 diabetes mellitus with unspecified complications: Secondary | ICD-10-CM | POA: Diagnosis not present

## 2021-06-29 LAB — POCT GLYCOSYLATED HEMOGLOBIN (HGB A1C): Hemoglobin A1C: 5.9 % — AB (ref 4.0–5.6)

## 2021-06-29 NOTE — Patient Instructions (Addendum)
Please continue the same metformin Please come back for a follow-up appointment in 6 months.  

## 2021-06-29 NOTE — Progress Notes (Signed)
Subjective:    Patient ID: Sonya Brady, female    DOB: [birth year], 70 y.o.   MRN: [id number]  HPI Pt returns for f/u of diabetes mellitus: DM type: 2 Dx'ed: 4403 Complications: none Therapy: metformin. GDM: never DKA: never Severe hypoglycemia: never Pancreatitis: never Pancreatic imaging: normal on 2019 CT.   Other: pt states she feels well in general.  She says cbg's are well-controlled.   She also has hypothyroidism (dx'ed 81; [place] in 2020 showed heterogeneous enlarged thyroid compatible with small goiter--bx or f/u not needed).   Past Medical History:  Diagnosis Date   Acne    Allergy    Breast mass, right    Cataract    Cholelithiasis    Depression    situational when husband was sick and dying.   Diverticulosis    GERD (gastroesophageal reflux disease)    H. pylori infection    Hypercholesteremia    Hypertension    Hypothyroidism     Past Surgical History:  Procedure Laterality Date   ABDOMINAL HYSTERECTOMY     both ovary intact   BREAST LUMPECTOMY Bilateral    carpel tunnel surgery Right    TYMPANOPLASTY     tube left ear    Social History   Socioeconomic History   Marital status: Single    Spouse name: Not on file   Number of children: 2   Years of education: Not on file   Highest education level: Not on file  Occupational History   Occupation: bookkeeper  Tobacco Use   Smoking status: Never   Smokeless tobacco: Never  Vaping Use   Vaping Use: Never used  Substance and Sexual Activity   Alcohol use: No   Drug use: No   Sexual activity: Not on file  Other Topics Concern   Not on file  Social History Narrative   Married '72-widowed '97   HSG   1 daughter '81, 1 son '83: son going thru divorce ['09]; applying to medical school   Work: bookkeeper [employer]   Social Determinants of Radio broadcast assistant Strain: Not on file  Food Insecurity: Not on file  Transportation Needs: Not on file  Physical Activity: Not on file   Stress: Not on file  Social Connections: Not on file  Intimate Partner Violence: Not on file    Current Outpatient Medications on File Prior to Visit  Medication Sig Dispense Refill   atorvastatin (LIPITOR) 80 MG tablet Take 1 tablet by mouth once daily 90 tablet 0   Calcium Carbonate-Vitamin D (CALCIUM + D PO) Take by mouth.     dorzolamide-timolol (COSOPT) 22.3-6.8 MG/ML ophthalmic solution Place 1 drop into both eyes 2 (two) times daily.     latanoprost (XALATAN) 0.005 % ophthalmic solution Place 1 drop into both eyes at bedtime.     levothyroxine (SYNTHROID) 112 MCG tablet TAKE 1 TABLET BY MOUTH ONCE DAILY BEFORE BREAKFAST 90 tablet 2   lisinopril-hydrochlorothiazide (ZESTORETIC) 20-25 MG tablet Take 1 tablet by mouth daily. 90 tablet 3   metFORMIN (GLUCOPHAGE) 500 MG tablet TAKE 1 TABLET BY MOUTH TWICE DAILY WITH A MEAL 180 tablet 0   Multiple Vitamins-Minerals (MULTIVITAMIN PO) Take by mouth.     Omega-3 Fatty Acids (FISH OIL PO) Take by mouth.     omeprazole (PRILOSEC) 20 MG capsule Take 1 capsule by mouth once daily 90 capsule 1   ondansetron (ZOFRAN-ODT) 4 MG disintegrating tablet Take 1 tablet by mouth as needed.  OneTouch Delica Lancets 35T MISC USE TO CHECK SUGARS ONCE DAILY 100 each 0   ONETOUCH VERIO test strip USE 1 STRIP TO CHECK BLOOD SUGARS ONCE DAILY 75 each 0   timolol (BETIMOL) 0.5 % ophthalmic solution 1 drop 2 (two) times daily.     No current facility-administered medications on file prior to visit.    Allergies  Allergen Reactions   No Known Allergies     Family History  Problem Relation Age of Onset   Other Father 19       deceased, unknown causes in 18-Jan-2023   Hypertension Mother    Other Mother 54       SBO, deceased   Thyroid disease Brother    Thyroid disease Sister    Breast cancer Neg Hx    Colon cancer Neg Hx    Esophageal cancer Neg Hx    Rectal cancer Neg Hx    Stomach cancer Neg Hx    Diabetes Neg Hx     BP 140/80 (BP Location: Right  Arm, Patient Position: Sitting, Cuff Size: Normal)   Pulse 84   Ht 5' 5.5" (1.664 m)   Wt 163 lb (73.9 kg)   SpO2 95%   BMI 26.71 kg/m    Review of Systems     Objective:   Physical Exam Pulses: dorsalis pedis intact bilat.   MSK: no deformity of the feet CV: trace bilat leg edema Skin:  no ulcer on the feet.  normal color and temp on the feet.  Neuro: sensation is intact to touch on the feet.   Ext: there is bilateral onychomycosis of the toenails.    Lab Results  Component Value Date   HGBA1C 5.9 (A) 06/29/2021       Assessment & Plan:  Type 2 DM: well-controlled  Patient Instructions  Please continue the same metformin.   Please come back for a follow-up appointment in 6 months.

## 2021-07-28 ENCOUNTER — Ambulatory Visit (INDEPENDENT_AMBULATORY_CARE_PROVIDER_SITE_OTHER): Payer: Medicare HMO

## 2021-07-28 ENCOUNTER — Other Ambulatory Visit: Payer: Self-pay

## 2021-07-28 VITALS — Ht 65.5 in

## 2021-07-28 DIAGNOSIS — Z23 Encounter for immunization: Secondary | ICD-10-CM

## 2021-08-01 DIAGNOSIS — M79675 Pain in left toe(s): Secondary | ICD-10-CM | POA: Diagnosis not present

## 2021-08-16 ENCOUNTER — Other Ambulatory Visit: Payer: Self-pay | Admitting: Endocrinology

## 2021-08-24 DIAGNOSIS — M1812 Unilateral primary osteoarthritis of first carpometacarpal joint, left hand: Secondary | ICD-10-CM | POA: Diagnosis not present

## 2021-08-24 DIAGNOSIS — G5602 Carpal tunnel syndrome, left upper limb: Secondary | ICD-10-CM | POA: Diagnosis not present

## 2021-08-24 DIAGNOSIS — M25532 Pain in left wrist: Secondary | ICD-10-CM | POA: Diagnosis not present

## 2021-08-24 DIAGNOSIS — G5603 Carpal tunnel syndrome, bilateral upper limbs: Secondary | ICD-10-CM | POA: Insufficient documentation

## 2021-08-29 DIAGNOSIS — H7312 Chronic myringitis, left ear: Secondary | ICD-10-CM | POA: Diagnosis not present

## 2021-09-05 ENCOUNTER — Other Ambulatory Visit: Payer: Self-pay | Admitting: Endocrinology

## 2021-09-05 ENCOUNTER — Other Ambulatory Visit: Payer: Self-pay | Admitting: Internal Medicine

## 2021-09-05 DIAGNOSIS — E118 Type 2 diabetes mellitus with unspecified complications: Secondary | ICD-10-CM

## 2021-09-09 DIAGNOSIS — M79675 Pain in left toe(s): Secondary | ICD-10-CM | POA: Insufficient documentation

## 2021-09-09 DIAGNOSIS — G5602 Carpal tunnel syndrome, left upper limb: Secondary | ICD-10-CM | POA: Diagnosis not present

## 2021-09-13 DIAGNOSIS — M79675 Pain in left toe(s): Secondary | ICD-10-CM | POA: Diagnosis not present

## 2021-09-13 DIAGNOSIS — S92412D Displaced fracture of proximal phalanx of left great toe, subsequent encounter for fracture with routine healing: Secondary | ICD-10-CM | POA: Diagnosis not present

## 2021-09-23 DIAGNOSIS — H40023 Open angle with borderline findings, high risk, bilateral: Secondary | ICD-10-CM | POA: Diagnosis not present

## 2021-09-26 ENCOUNTER — Telehealth: Payer: Self-pay

## 2021-09-26 DIAGNOSIS — Z0100 Encounter for examination of eyes and vision without abnormal findings: Secondary | ICD-10-CM | POA: Diagnosis not present

## 2021-09-26 NOTE — Telephone Encounter (Signed)
Spoke with the patient an made her aware that she can receive her shingles vaccines at her pharmacy

## 2021-09-26 NOTE — Telephone Encounter (Signed)
Patient wants to know if she needs a shingles booster.

## 2021-09-28 DIAGNOSIS — M1812 Unilateral primary osteoarthritis of first carpometacarpal joint, left hand: Secondary | ICD-10-CM | POA: Diagnosis not present

## 2021-09-28 DIAGNOSIS — G5602 Carpal tunnel syndrome, left upper limb: Secondary | ICD-10-CM | POA: Diagnosis not present

## 2021-10-21 ENCOUNTER — Encounter (HOSPITAL_BASED_OUTPATIENT_CLINIC_OR_DEPARTMENT_OTHER): Payer: Self-pay | Admitting: Orthopedic Surgery

## 2021-10-21 DIAGNOSIS — G5602 Carpal tunnel syndrome, left upper limb: Secondary | ICD-10-CM

## 2021-10-21 HISTORY — DX: Carpal tunnel syndrome, left upper limb: G56.02

## 2021-10-24 ENCOUNTER — Telehealth: Payer: Self-pay | Admitting: Endocrinology

## 2021-10-24 ENCOUNTER — Other Ambulatory Visit: Payer: Self-pay

## 2021-10-24 ENCOUNTER — Encounter (HOSPITAL_BASED_OUTPATIENT_CLINIC_OR_DEPARTMENT_OTHER): Payer: Self-pay | Admitting: Orthopedic Surgery

## 2021-10-24 DIAGNOSIS — H9312 Tinnitus, left ear: Secondary | ICD-10-CM

## 2021-10-24 DIAGNOSIS — E118 Type 2 diabetes mellitus with unspecified complications: Secondary | ICD-10-CM

## 2021-10-24 HISTORY — DX: Tinnitus, left ear: H93.12

## 2021-10-24 MED ORDER — ONETOUCH VERIO W/DEVICE KIT: PACK | 0 refills | Status: DC

## 2021-10-24 NOTE — Telephone Encounter (Signed)
LVM for pt to cb with her questions/concerns about her goiter

## 2021-10-24 NOTE — Telephone Encounter (Signed)
Patient called has questions about the goiter and would like a call back as soon as possible. She is scheduled for surgery this Thursday 10/27/2021. Please call patient at [phone].  Patient also need OneTouch Verio Flex meter. This does not appear under her account.

## 2021-10-24 NOTE — Progress Notes (Signed)
Spoke w/ via phone for pre-op interview---pt Lab needs dos---- ekg, I stat              Lab results------thryoid [place] 05-09-2019 epic COVID test -----patient states asymptomatic no test needed Arrive at -------530 am 10-27-2021 NPO after MN NO Solid Food.  Clear liquids from MN until---430 am Med rec completed Medications to take morning of surgery -----pt wishes to take only omeprazole and eye drop am of surgery Diabetic medication -----none day of surgery Patient instructed no nail polish to be worn day of surgery Patient instructed to bring photo id and insurance card day of surgery Patient aware to have Driver (ride ) / caregiver    for 24 hours after surgery  driver/caregiver Sonya Brady Patient Special Instructions -----none Pre-Op special Istructions -----none Patient verbalized understanding of instructions that were given at this phone interview. Patient denies shortness of breath, chest pain, fever, cough at this phone interview.

## 2021-10-24 NOTE — Discharge Instructions (Addendum)
Orthopaedic Hand Surgery Discharge Instructions  WEIGHT BEARING STATUS: Non weight bearing on operative extremity  DRESSING CARE: Please keep your dressing/splint/cast clean and dry until your follow-up appointment. You may shower by placing a waterproof covering over your dressing/splint/cast. Contact your surgeon if your splint/cast gets wet. It will need to be changed to prevent skin breakdown.  PAIN CONTROL: First line medications for post operative pain control are Tylenol (acetaminophen) and Motrin (ibuprofen) if you are able to take these medications. If you have been prescribed a medication these can be taken as breakthrough pain medications. Please note that some narcotic pain medication has acetaminophen added and you should never consume more than 4,000mg of acetaminophen in 24-hour period. Please note that if you are given Toradol (ketorolac) you should not take similar medications such as ibuprofen or naproxen.  DISCHARGE MEDICATIONS: If you have been prescribed medication it was sent electronically to your pharmacy. No changes have been made to your home medications.  ICE/ELEVATION: Ice and elevate your injured extremity as needed. Avoid direct contact of ice with skin.   BANDAGE FEELS TOO TIGHT: If your bandage feels too tight, first make sure you are elevating your fingers as much as possible. The outer layer of the bandage can be unwrapped and reapplied more loosely. If no improvement, you may carefully cut the inner layer longitudinally until the pressure has resolved and then rewrap the outer layer. If you are not comfortable with these instructions, please call the office and the bandage can be changed for you.   FOLLOW UP: You will be called after surgery with an appointment date and time, however if you have not received a phone call within 3 days, please call during regular office hours at [phone] to schedule a post operative appointment.  Please Seek Medical Attention  if: Call MD for: pain or pressure in chest, jaw, arm, back, neck  Call MD for: temperature greater than 101 F for more than 24 hrs Call MD for: difficulty breathing Call MD for: incision redness, bleeding, drainage  Call MD for: palpitations or feeling that the heart is racing  Call MD for: increased swelling in arm, leg, ankle, or abdomen  Call MD for: lightheadedness, dizziness, fainting Call 911 or go to ER for any medical emergency if you are not able to get in touch with your doctor   J. Reid Spears, MD Orthopaedic Hand Surgeon [facility] Office number: [phone] [address] [place], [place] [address]  Regional Anesthesia Blocks  1. Numbness or the inability to move the "blocked" extremity may last from 3-48 hours after placement. The length of time depends on the medication injected and your individual response to the medication. If the numbness is not going away after 48 hours, call your surgeon.  2. The extremity that is blocked will need to be protected until the numbness is gone and the  Strength has returned. Because you cannot feel it, you will need to take extra care to avoid injury. Because it may be weak, you may have difficulty moving it or using it. You may not know what position it is in without looking at it while the block is in effect.  3. For blocks in the legs and feet, returning to weight bearing and walking needs to be done carefully. You will need to wait until the numbness is entirely gone and the strength has returned. You should be able to move your leg and foot normally before you try and bear weight or walk.   You will need someone to be with you when you first try to ensure you do not fall and possibly risk injury.  4. Bruising and tenderness at the needle site are common side effects and will resolve in a few days.  5. Persistent numbness or new problems with movement should be communicated to the surgeon or the [facility]  ([phone])/ [facility] ([phone]).  Post Anesthesia Home Care Instructions  Activity: Get plenty of rest for the remainder of the day. A responsible individual must stay with you for 24 hours following the procedure.  For the next 24 hours, DO NOT: -Drive a car -Operate machinery -Drink alcoholic beverages -Take any medication unless instructed by your physician -Make any legal decisions or sign important papers.  Meals: Start with liquid foods such as gelatin or soup. Progress to regular foods as tolerated. Avoid greasy, spicy, heavy foods. If nausea and/or vomiting occur, drink only clear liquids until the nausea and/or vomiting subsides. Call your physician if vomiting continues.  Special Instructions/Symptoms: Your throat may feel dry or sore from the anesthesia or the breathing tube placed in your throat during surgery. If this causes discomfort, gargle with warm salt water. The discomfort should disappear within 24 hours.   

## 2021-10-24 NOTE — H&P (Signed)
Preoperative History & Physical Exam  Surgeon: Matt Holmes, MD  Diagnosis: left thumb carpometacarpal joint arthritis, left carpal tunnel syndrome  Planned Procedure: Procedure(s) (LRB): left thumb carpometacarpal joint arthroplasty with tendon transfer (Left) CARPAL TUNNEL RELEASE (Left)  History of Present Illness:   Patient is a 71 y.o. female with symptoms consistent with left thumb carpometacarpal joint arthritis, left carpal tunnel syndrome who presents for surgical intervention. The risks, benefits and alternatives of surgical intervention were discussed and informed consent was obtained prior to surgery.  Past Medical History:  Past Medical History:  Diagnosis Date   Acne    Allergy    Breast mass, right    Carpal tunnel syndrome of left wrist 10/21/2021   Cataract    Cholelithiasis    Depression    situational when husband was sick and dying.   Diabetes mellitus Type 2    Diverticulosis    GERD (gastroesophageal reflux disease)    H. pylori infection    Hypercholesteremia    Hypertension    Hypothyroidism     Past Surgical History:  Past Surgical History:  Procedure Laterality Date   ABDOMINAL HYSTERECTOMY     both ovary intact   BREAST LUMPECTOMY Bilateral    carpel tunnel surgery Right    TYMPANOPLASTY     tube left ear   UPPER GI ENDOSCOPY     2014 and 2018    Medications:  Prior to Admission medications   Medication Sig Start Date End Date Taking? Authorizing Provider  atorvastatin (LIPITOR) 80 MG tablet Take 1 tablet by mouth once daily 09/05/21   Hoyt Koch, MD  Calcium Carbonate-Vitamin D (CALCIUM + D PO) Take by mouth.    [provider]  dorzolamide-timolol (COSOPT) 22.3-6.8 MG/ML ophthalmic solution Place 1 drop into both eyes 2 (two) times daily. 07/07/19   [provider]  latanoprost (XALATAN) 0.005 % ophthalmic solution Place 1 drop into both eyes at bedtime.    [provider]  levothyroxine  (SYNTHROID) 112 MCG tablet TAKE 1 TABLET BY MOUTH ONCE DAILY BEFORE BREAKFAST 03/16/21   Renato Shin, MD  lisinopril-hydrochlorothiazide (ZESTORETIC) 20-25 MG tablet Take 1 tablet by mouth daily. 01/13/21   Hoyt Koch, MD  metFORMIN (GLUCOPHAGE) 500 MG tablet TAKE 1 TABLET BY MOUTH TWICE DAILY WITH A MEAL 09/05/21   Renato Shin, MD  Multiple Vitamins-Minerals (MULTIVITAMIN PO) Take by mouth.    [provider]  Omega-3 Fatty Acids (FISH OIL PO) Take by mouth.    [provider]  omeprazole (PRILOSEC) 20 MG capsule Take 1 capsule by mouth once daily 04/25/21   Irene Shipper, MD  ondansetron (ZOFRAN-ODT) 4 MG disintegrating tablet Take 1 tablet by mouth as needed.    [provider]  OneTouch Delica Lancets 76E MISC USE 1  TO CHECK GLUCOSE ONCE DAILY 08/16/21   Renato Shin, MD  [facility] VERIO test strip USE 1 STRIP TO CHECK GLUCOSE ONCE DAILY 08/16/21   Renato Shin, MD  timolol (BETIMOL) 0.5 % ophthalmic solution 1 drop 2 (two) times daily.    [provider]    Allergies:  No known allergies  Review of Systems: Negative except per HPI.  Physical Exam: Alert and oriented, NAD Head and neck: no masses, normal alignment CV: pulse intact Pulm: no increased work of breathing, respirations even and unlabored Abdomen: non-distended Extremities: extremities warm and well perfused  LABS: No results found for this or any previous visit (from the past 2160 hour(s)).  Complete History and Physical exam available in the office notes  Orene Desanctis

## 2021-10-27 ENCOUNTER — Other Ambulatory Visit: Payer: Self-pay

## 2021-10-27 ENCOUNTER — Ambulatory Visit (HOSPITAL_BASED_OUTPATIENT_CLINIC_OR_DEPARTMENT_OTHER): Payer: Medicare HMO | Admitting: Anesthesiology

## 2021-10-27 ENCOUNTER — Encounter (HOSPITAL_BASED_OUTPATIENT_CLINIC_OR_DEPARTMENT_OTHER): Payer: Self-pay | Admitting: Orthopedic Surgery

## 2021-10-27 ENCOUNTER — Encounter (HOSPITAL_BASED_OUTPATIENT_CLINIC_OR_DEPARTMENT_OTHER)
Admission: RE | Disposition: A | Discharge status: Home / Self Care | Payer: Self-pay | Source: Ambulatory Visit | Attending: Orthopedic Surgery

## 2021-10-27 ENCOUNTER — Ambulatory Visit (HOSPITAL_BASED_OUTPATIENT_CLINIC_OR_DEPARTMENT_OTHER)
Admission: RE | Admit: 2021-10-27 | Discharge: 2021-10-27 | Disposition: A | Discharge status: Home / Self Care | Payer: Medicare HMO | Source: Ambulatory Visit | Attending: Orthopedic Surgery | Admitting: Orthopedic Surgery

## 2021-10-27 DIAGNOSIS — K219 Gastro-esophageal reflux disease without esophagitis: Secondary | ICD-10-CM | POA: Diagnosis not present

## 2021-10-27 DIAGNOSIS — G5602 Carpal tunnel syndrome, left upper limb: Secondary | ICD-10-CM | POA: Insufficient documentation

## 2021-10-27 DIAGNOSIS — I10 Essential (primary) hypertension: Secondary | ICD-10-CM | POA: Diagnosis not present

## 2021-10-27 DIAGNOSIS — E119 Type 2 diabetes mellitus without complications: Secondary | ICD-10-CM | POA: Insufficient documentation

## 2021-10-27 DIAGNOSIS — E039 Hypothyroidism, unspecified: Secondary | ICD-10-CM | POA: Diagnosis not present

## 2021-10-27 DIAGNOSIS — F32A Depression, unspecified: Secondary | ICD-10-CM | POA: Diagnosis not present

## 2021-10-27 DIAGNOSIS — R69 Illness, unspecified: Secondary | ICD-10-CM | POA: Diagnosis not present

## 2021-10-27 DIAGNOSIS — M1812 Unilateral primary osteoarthritis of first carpometacarpal joint, left hand: Secondary | ICD-10-CM | POA: Diagnosis not present

## 2021-10-27 DIAGNOSIS — M189 Osteoarthritis of first carpometacarpal joint, unspecified: Secondary | ICD-10-CM | POA: Diagnosis not present

## 2021-10-27 HISTORY — PX: CARPAL TUNNEL RELEASE: SHX101

## 2021-10-27 HISTORY — DX: Other specified postprocedural states: R11.2

## 2021-10-27 HISTORY — DX: Nontoxic goiter, unspecified: E04.9

## 2021-10-27 HISTORY — DX: Unspecified osteoarthritis, unspecified site: M19.90

## 2021-10-27 HISTORY — DX: Unspecified Eustachian tube disorder, left ear: H69.92

## 2021-10-27 HISTORY — DX: Insomnia, unspecified: G47.00

## 2021-10-27 HISTORY — PX: CARPOMETACARPEL SUSPENSION PLASTY: SHX5005

## 2021-10-27 HISTORY — DX: Type 2 diabetes mellitus without complications: E11.9

## 2021-10-27 HISTORY — DX: Other specified postprocedural states: Z98.890

## 2021-10-27 HISTORY — DX: Other complications of anesthesia, initial encounter: T88.59XA

## 2021-10-27 LAB — POCT I-STAT, CHEM 8
BUN: 13 mg/dL (ref 8–23)
Calcium, Ion: 1.17 mmol/L (ref 1.15–1.40)
Chloride: 103 mmol/L (ref 98–111)
Creatinine, Ser: 0.7 mg/dL (ref 0.44–1.00)
Glucose, Bld: 133 mg/dL — ABNORMAL HIGH (ref 70–99)
HCT: 41 % (ref 36.0–46.0)
Hemoglobin: 13.9 g/dL (ref 12.0–15.0)
Potassium: 3.5 mmol/L (ref 3.5–5.1)
Sodium: 139 mmol/L (ref 135–145)
TCO2: 24 mmol/L (ref 22–32)

## 2021-10-27 SURGERY — CARPOMETACARPEL (CMC) SUSPENSION PLASTY
Anesthesia: Monitor Anesthesia Care | Site: Hand | Laterality: Left

## 2021-10-27 MED ORDER — PROPOFOL 10 MG/ML IV BOLUS
INTRAVENOUS | Status: AC
  Filled 2021-10-27: qty 20

## 2021-10-27 MED ORDER — CEFAZOLIN SODIUM-DEXTROSE 2-4 GM/100ML-% IV SOLN
2.0000 g | INTRAVENOUS | Status: AC
  Administered 2021-10-27: 2 g via INTRAVENOUS

## 2021-10-27 MED ORDER — HYDROMORPHONE HCL 1 MG/ML IJ SOLN: 0.2500 mg | INTRAMUSCULAR | Status: DC | PRN

## 2021-10-27 MED ORDER — FENTANYL CITRATE (PF) 100 MCG/2ML IJ SOLN
100.0000 ug | Freq: Once | INTRAMUSCULAR | Status: AC
  Administered 2021-10-27: 50 ug via INTRAVENOUS

## 2021-10-27 MED ORDER — OXYCODONE HCL 5 MG/5ML PO SOLN: 5.0000 mg | Freq: Once | ORAL | Status: DC | PRN

## 2021-10-27 MED ORDER — LIDOCAINE HCL (PF) 1 % IJ SOLN
INTRAMUSCULAR | Status: DC | PRN
  Administered 2021-10-27: 10 mL

## 2021-10-27 MED ORDER — ONDANSETRON HCL 4 MG/2ML IJ SOLN: 4.0000 mg | Freq: Once | INTRAMUSCULAR | Status: DC | PRN

## 2021-10-27 MED ORDER — MIDAZOLAM HCL 2 MG/2ML IJ SOLN
INTRAMUSCULAR | Status: AC
  Filled 2021-10-27: qty 2

## 2021-10-27 MED ORDER — CEFAZOLIN SODIUM-DEXTROSE 2-4 GM/100ML-% IV SOLN
INTRAVENOUS | Status: AC
  Filled 2021-10-27: qty 100

## 2021-10-27 MED ORDER — PROPOFOL 500 MG/50ML IV EMUL
INTRAVENOUS | Status: DC | PRN
  Administered 2021-10-27: 75 ug/kg/min via INTRAVENOUS

## 2021-10-27 MED ORDER — FENTANYL CITRATE (PF) 100 MCG/2ML IJ SOLN
INTRAMUSCULAR | Status: AC
  Filled 2021-10-27: qty 2

## 2021-10-27 MED ORDER — 0.9 % SODIUM CHLORIDE (POUR BTL) OPTIME
TOPICAL | Status: DC | PRN
  Administered 2021-10-27: 500 mL

## 2021-10-27 MED ORDER — ROPIVACAINE HCL 5 MG/ML IJ SOLN
INTRAMUSCULAR | Status: DC | PRN
Start: 2021-10-27 — End: 2021-10-27
  Administered 2021-10-27: 25 mL via PERINEURAL

## 2021-10-27 MED ORDER — LIDOCAINE HCL (CARDIAC) PF 100 MG/5ML IV SOSY
PREFILLED_SYRINGE | INTRAVENOUS | Status: DC | PRN
  Administered 2021-10-27: 20 mg via INTRAVENOUS

## 2021-10-27 MED ORDER — OXYCODONE-ACETAMINOPHEN 5-325 MG PO TABS
1.0000 | ORAL_TABLET | Freq: Four times a day (QID) | ORAL | 0 refills | Status: AC | PRN
Start: 2021-10-27 — End: 2021-11-01

## 2021-10-27 MED ORDER — DEXAMETHASONE SODIUM PHOSPHATE 10 MG/ML IJ SOLN
INTRAMUSCULAR | Status: DC | PRN
Start: 2021-10-27 — End: 2021-10-27
  Administered 2021-10-27: 10 mg

## 2021-10-27 MED ORDER — PROPOFOL 10 MG/ML IV BOLUS
INTRAVENOUS | Status: DC | PRN
Start: 2021-10-27 — End: 2021-10-27
  Administered 2021-10-27: 30 mg via INTRAVENOUS

## 2021-10-27 MED ORDER — LACTATED RINGERS IV SOLN: INTRAVENOUS | Status: DC

## 2021-10-27 MED ORDER — DEXMEDETOMIDINE (PRECEDEX) IN NS 20 MCG/5ML (4 MCG/ML) IV SYRINGE
PREFILLED_SYRINGE | INTRAVENOUS | Status: AC
  Filled 2021-10-27: qty 5

## 2021-10-27 MED ORDER — MIDAZOLAM HCL 2 MG/2ML IJ SOLN
2.0000 mg | Freq: Once | INTRAMUSCULAR | Status: AC
  Administered 2021-10-27: 1 mg via INTRAVENOUS

## 2021-10-27 MED ORDER — AMISULPRIDE (ANTIEMETIC) 5 MG/2ML IV SOLN: 10.0000 mg | Freq: Once | INTRAVENOUS | Status: DC | PRN

## 2021-10-27 MED ORDER — OXYCODONE HCL 5 MG PO TABS: 5.0000 mg | ORAL_TABLET | Freq: Once | ORAL | Status: DC | PRN

## 2021-10-27 SURGICAL SUPPLY — 30 items
BLADE SURG 15 STRL LF DISP TIS (BLADE) ×1 IMPLANT
BLADE SURG 15 STRL SS (BLADE) ×2
BNDG ELASTIC 4X5.8 VLCR STR LF (GAUZE/BANDAGES/DRESSINGS) ×2 IMPLANT
BNDG ESMARK 4X9 LF (GAUZE/BANDAGES/DRESSINGS) ×2 IMPLANT
COVER BACK TABLE 60X90IN (DRAPES) ×2 IMPLANT
CUFF TOURN SGL QUICK 24 (TOURNIQUET CUFF) ×2
CUFF TRNQT CYL 24X4X16.5-23 (TOURNIQUET CUFF) ×1 IMPLANT
DRAPE EXTREMITY T 121X128X90 (DISPOSABLE) ×2 IMPLANT
GAUZE 4X4 16PLY ~~LOC~~+RFID DBL (SPONGE) ×2 IMPLANT
GAUZE SPONGE 4X4 12PLY STRL (GAUZE/BANDAGES/DRESSINGS) ×2 IMPLANT
GAUZE XEROFORM 1X8 LF (GAUZE/BANDAGES/DRESSINGS) ×2 IMPLANT
GOWN STRL REUS W/ TWL LRG LVL3 (GOWN DISPOSABLE) ×1 IMPLANT
GOWN STRL REUS W/TWL LRG LVL3 (GOWN DISPOSABLE) ×2
HIBICLENS CHG 4% 4OZ BTL (MISCELLANEOUS) ×2 IMPLANT
KIT TURNOVER CYSTO (KITS) ×2 IMPLANT
KNIFE CARPAL TUNNEL (BLADE) ×2 IMPLANT
NEEDLE HYPO 22GX1.5 SAFETY (NEEDLE) ×2 IMPLANT
NS IRRIG 500ML POUR BTL (IV SOLUTION) ×2 IMPLANT
PACK BASIN DAY SURGERY FS (CUSTOM PROCEDURE TRAY) ×2 IMPLANT
PAD CAST 4YDX4 CTTN HI CHSV (CAST SUPPLIES) ×1 IMPLANT
PADDING CAST COTTON 4X4 STRL (CAST SUPPLIES) ×2
SUT CHROMIC 4 0 PS 2 18 (SUTURE) ×1 IMPLANT
SUT ETHILON 4 0 PS 2 18 (SUTURE) ×2 IMPLANT
SUT ORTHOCORD W/MULTIPK NDL (SUTURE) ×1 IMPLANT
SUT VIC AB 4-0 PS2 18 (SUTURE) ×1 IMPLANT
SYR 10ML LL (SYRINGE) ×2 IMPLANT
SYR BULB EAR ULCER 3OZ GRN STR (SYRINGE) ×2 IMPLANT
TOWEL OR 17X26 10 PK STRL BLUE (TOWEL DISPOSABLE) ×2 IMPLANT
TRAY DSU PREP LF (CUSTOM PROCEDURE TRAY) ×2 IMPLANT
UNDERPAD 30X36 HEAVY ABSORB (UNDERPADS AND DIAPERS) ×2 IMPLANT

## 2021-10-27 NOTE — Anesthesia Preprocedure Evaluation (Addendum)
Anesthesia Evaluation  Patient identified by MRN, date of birth, ID band Patient awake    Reviewed: Allergy & Precautions, NPO status , Patient's Chart, lab work & pertinent test results  History of Anesthesia Complications (+) PONV, AWARENESS UNDER ANESTHESIA and history of anesthetic complications (PONV once in 2022 w/ ear surgery, awareness w/ breast biopsy)  Airway Mallampati: I  TM Distance: >3 FB Neck ROM: Full    Dental no notable dental hx. (+) Teeth Intact, Dental Advisory Given   Pulmonary neg pulmonary ROS,    Pulmonary exam normal breath sounds clear to auscultation       Cardiovascular hypertension (151/82 in preop), Pt. on medications Normal cardiovascular exam Rhythm:Regular Rate:Normal     Neuro/Psych PSYCHIATRIC DISORDERS Depression negative neurological ROS     GI/Hepatic Neg liver ROS, GERD  Medicated and Controlled,  Endo/Other  diabetes, Well Controlled, Type 2, Oral Hypoglycemic AgentsHypothyroidism a1c 5.9  Renal/GU negative Renal ROS  negative genitourinary   Musculoskeletal  (+) Arthritis , Osteoarthritis,  L thumb joint arthritis, L CTS   Abdominal   Peds negative pediatric ROS (+)  Hematology negative hematology ROS (+)   Anesthesia Other Findings   Reproductive/Obstetrics negative OB ROS                            Anesthesia Physical Anesthesia Plan  ASA: 2  Anesthesia Plan: MAC and Regional   Post-op Pain Management: Regional block   Induction:   PONV Risk Score and Plan: 2 and Propofol infusion and TIVA  Airway Management Planned: Natural Airway and Simple Face Mask  Additional Equipment: None  Intra-op Plan:   Post-operative Plan:   Informed Consent: I have reviewed the patients History and Physical, chart, labs and discussed the procedure including the risks, benefits and alternatives for the proposed anesthesia with the patient or authorized  representative who has indicated his/her understanding and acceptance.       Plan Discussed with: CRNA  Anesthesia Plan Comments:         Anesthesia Quick Evaluation

## 2021-10-27 NOTE — Progress Notes (Signed)
Assisted Dr. Doroteo Glassman with left, ultrasound guided, supraclavicular block. Side rails up, monitors on throughout procedure. See vital signs in flow sheet. Tolerated Procedure well.

## 2021-10-27 NOTE — Op Note (Signed)
OPERATIVE NOTE  DATE OF PROCEDURE: 10/27/2021  SURGEONS:  Primary: Orene Desanctis, MD  PREOPERATIVE DIAGNOSIS: left thumb carpometacarpal joint arthritis, left carpal tunnel syndrome  POSTOPERATIVE DIAGNOSIS: Same  NAME OF PROCEDURE:   LEFT thumb CMC arthroplasty, trapeziectomy LEFT thumb flexor carpi radialis to abductor pollicis longus tendon transfer at level of [place] joint Four view radiographs of the LEFT thumb and hand with intra-operative interpretation LEFT carpal tunnel release  ANESTHESIA: Regional  SKIN PREPARATION: Hibiclens  ESTIMATED BLOOD LOSS: Minimal  IMPLANTS: none  INDICATIONS:  Sonya Brady is a 71 y.o. female who has the above preoperative diagnosis. The patient has decided to proceed with surgical intervention.  Risks, benefits and alternatives of operative management were discussed including, but not limited to, risks of anesthesia complications, infection, pain, persistent symptoms, stiffness, need for future surgery.  The patient understands, agrees and elects to proceed with surgery.    DESCRIPTION OF PROCEDURE: The patient was met in the pre-operative area and their identity was verified.  The operative location and laterality was also verified and marked.  The patient was brought to the OR and was placed supine on the table.  After repeat patient identification with the operative team anesthesia was provided and the patient was prepped and draped in the usual sterile fashion.  A final timeout was performed verifying the correction patient, procedure, location and laterality.  The left upper extremity was exsanguinated with an esmarch and forearm tourniquet inflated to 241mHg.  A standard 1.5 cm incision was made in the midpalm of the LEFT hand.  This was carried down through the subcutaneous tissues and palmar fascia to the transverse carpal ligament.  The distal one-half of the transverse carpal ligament was incised longitudinally under direct vision using a 15  blade.  The carpal tunnel release guide was then placed under direct vision on the transverse carpal ligament and slid proximally.  The guide was palpated into appropriate alignment longitudinally.  Contact with the transverse carpal ligament was maintained throughout passing.  The blade was engaged into the guide and the remaining portion of the transverse carpal ligament released completely.  No other abnormalities were noted.  The wound was copiously irrigated and the skin closed using horizontal mattress 4-0 chromic sutures.   A Wagner approach between the APL and APB was performed. Skin and subcutaneous tissues were divided. Branches of the radial sensory nerve were mobilized and protected. The APL and thenar eminence muscle interval was incised sharply down to bone taking care to leave the APL insertion attached to the base of the first metacarpal. The CMC and STT joint were identified and the capsule was incised circumferentially around the trapezium. A K-wire was placed in the trapezium and c-arm fluorscopy was used to confirm carpectomy of this bone. A combination of sharp dissection with 15 blade and Mcglamry elevator was used to removed the trapezium en bloc. The trapezium was inspected and there was significant degeneration of the joint surface with eburnation and sclerosis. Attention was then turned to the scaphotrapezoid articulation and there was preserved joint space. Via digital palpation and c-arm fluoroscopy shots, the entire trapezium and bone fragments were confirmed to be removed. After carpectomy, attention was turned to the APL to FCR suspension arthroplasty tendon transfer. The insertion of the FCR tendon and APL tendon was identified. A #2 orthocord suture was utilized to secure the transfer in locking fashion and tied deep within the wound via square knots. The thumb metacarpal was suspended to the level of the second metacarpal  with excellent space between the thumb metacarpal and  scaphoid. There was full opposition, circumduction of the thumb after transfer. The wound was thoroughly irrigated with normal saline. The capsule and thenar muscle was reapproximated with multiple interrupted 4-0 vicryl suture and the skin was closed with 4-0 chromic horizontal mattress sutures. A sterile soft dressing and thumb spica splint was applied. The tourniquet was deflated and fingers were pink, warm, and well perfused at the end of the case. All counts were correct x 2. The patient tolerated the procedure well and was brought to PACU for recovery.    Sonya Holmes, MD

## 2021-10-27 NOTE — Anesthesia Postprocedure Evaluation (Signed)
Anesthesia Post Note  Patient: Sonya Brady  Procedure(s) Performed: left thumb carpometacarpal joint arthroplasty with tendon transfer (Left: Hand) CARPAL TUNNEL RELEASE (Left: Hand)     Patient location during evaluation: PACU Anesthesia Type: Regional and MAC Level of consciousness: awake and alert Pain management: pain level controlled Vital Signs Assessment: post-procedure vital signs reviewed and stable Respiratory status: spontaneous breathing, nonlabored ventilation and respiratory function stable Cardiovascular status: blood pressure returned to baseline and stable Postop Assessment: no apparent nausea or vomiting Anesthetic complications: no   No notable events documented.  Last Vitals:  Vitals:   10/27/21 0845 10/27/21 0915  BP: 129/81 134/76  Pulse: 76 70  Resp: 14 12  Temp: 36.5 C 36.9 C  SpO2: 95% 96%    Last Pain:  Vitals:   10/27/21 0915  TempSrc:   PainSc: 0-No pain                 Pervis Hocking

## 2021-10-27 NOTE — Interval H&P Note (Signed)
History and Physical Interval Note:  10/27/2021 7:17 AM  Sonya Brady  has presented today for surgery, with the diagnosis of left thumb carpometacarpal joint arthritis, left carpal tunnel syndrome.  The various methods of treatment have been discussed with the patient and family. After consideration of risks, benefits and other options for treatment, the patient has consented to  Procedure(s) with comments: left thumb carpometacarpal joint arthroplasty with tendon transfer (Left) - with MAC CARPAL TUNNEL RELEASE (Left) as a surgical intervention.  The patient's history has been reviewed, patient examined, no change in status, stable for surgery.  I have reviewed the patient's chart and labs.  Questions were answered to the patient's satisfaction.     Orene Desanctis

## 2021-10-27 NOTE — Transfer of Care (Signed)
Immediate Anesthesia Transfer of Care Note  Patient: Sonya Brady  Procedure(s) Performed: left thumb carpometacarpal joint arthroplasty with tendon transfer (Left: Hand) CARPAL TUNNEL RELEASE (Left: Hand)  Patient Location: PACU  Anesthesia Type:MAC and Regional  Level of Consciousness: awake, alert  and oriented  Airway & Oxygen Therapy: Patient Spontanous Breathing  Post-op Assessment: Report given to RN and Post -op Vital signs reviewed and stable  Post vital signs: Reviewed and stable  Last Vitals:  Vitals Value Taken Time  BP    Temp    Pulse    Resp    SpO2      Last Pain:  Vitals:   10/27/21 0536  TempSrc: Oral         Complications: No notable events documented.

## 2021-10-27 NOTE — Anesthesia Procedure Notes (Signed)
Anesthesia Regional Block: Supraclavicular block   Pre-Anesthetic Checklist: , timeout performed,  Correct Patient, Correct Site, Correct Laterality,  Correct Procedure, Correct Position, site marked,  Risks and benefits discussed,  Surgical consent,  Pre-op evaluation,  At surgeon's request and post-op pain management  Laterality: Left  Prep: Maximum Sterile Barrier Precautions used, chloraprep       Needles:  Injection technique: Single-shot  Needle Type: Echogenic Stimulator Needle     Needle Length: 9cm  Needle Gauge: 22     Additional Needles:   Procedures:,,,, ultrasound used (permanent image in chart),,    Narrative:  Start time: 08/17/2022 7:00 AM End time: 08/17/2022 7:05 AM Injection made incrementally with aspirations every 5 mL.  Performed by: Personally  Anesthesiologist: Abraham Entwistle M, DO  Additional Notes: Monitors applied. No increased pain on injection. No increased resistance to injection. Injection made in 5cc increments. Good needle visualization. Patient tolerated procedure well.      

## 2021-10-31 ENCOUNTER — Encounter (HOSPITAL_BASED_OUTPATIENT_CLINIC_OR_DEPARTMENT_OTHER): Payer: Self-pay | Admitting: Orthopedic Surgery

## 2021-11-01 DIAGNOSIS — G8918 Other acute postprocedural pain: Secondary | ICD-10-CM | POA: Insufficient documentation

## 2021-11-08 DIAGNOSIS — M1812 Unilateral primary osteoarthritis of first carpometacarpal joint, left hand: Secondary | ICD-10-CM | POA: Diagnosis not present

## 2021-11-08 DIAGNOSIS — G5602 Carpal tunnel syndrome, left upper limb: Secondary | ICD-10-CM | POA: Diagnosis not present

## 2021-11-14 ENCOUNTER — Other Ambulatory Visit: Payer: Self-pay | Admitting: Internal Medicine

## 2021-11-15 DIAGNOSIS — M1812 Unilateral primary osteoarthritis of first carpometacarpal joint, left hand: Secondary | ICD-10-CM | POA: Diagnosis not present

## 2021-11-15 DIAGNOSIS — G5602 Carpal tunnel syndrome, left upper limb: Secondary | ICD-10-CM | POA: Diagnosis not present

## 2021-11-22 DIAGNOSIS — M25642 Stiffness of left hand, not elsewhere classified: Secondary | ICD-10-CM | POA: Diagnosis not present

## 2021-11-25 DIAGNOSIS — H6522 Chronic serous otitis media, left ear: Secondary | ICD-10-CM | POA: Diagnosis not present

## 2021-11-25 DIAGNOSIS — H7312 Chronic myringitis, left ear: Secondary | ICD-10-CM | POA: Diagnosis not present

## 2021-11-29 DIAGNOSIS — M79642 Pain in left hand: Secondary | ICD-10-CM | POA: Diagnosis not present

## 2021-12-07 DIAGNOSIS — M25642 Stiffness of left hand, not elsewhere classified: Secondary | ICD-10-CM | POA: Diagnosis not present

## 2021-12-12 ENCOUNTER — Other Ambulatory Visit: Payer: Self-pay | Admitting: Internal Medicine

## 2021-12-12 ENCOUNTER — Other Ambulatory Visit: Payer: Self-pay | Admitting: Endocrinology

## 2021-12-12 DIAGNOSIS — E118 Type 2 diabetes mellitus with unspecified complications: Secondary | ICD-10-CM

## 2021-12-13 DIAGNOSIS — M25642 Stiffness of left hand, not elsewhere classified: Secondary | ICD-10-CM | POA: Diagnosis not present

## 2021-12-19 DIAGNOSIS — M25642 Stiffness of left hand, not elsewhere classified: Secondary | ICD-10-CM | POA: Diagnosis not present

## 2021-12-21 DIAGNOSIS — M1812 Unilateral primary osteoarthritis of first carpometacarpal joint, left hand: Secondary | ICD-10-CM | POA: Diagnosis not present

## 2021-12-22 DIAGNOSIS — M25642 Stiffness of left hand, not elsewhere classified: Secondary | ICD-10-CM | POA: Diagnosis not present

## 2021-12-26 DIAGNOSIS — M25642 Stiffness of left hand, not elsewhere classified: Secondary | ICD-10-CM | POA: Diagnosis not present

## 2021-12-28 ENCOUNTER — Ambulatory Visit: Payer: Medicare HMO | Admitting: Endocrinology

## 2021-12-29 DIAGNOSIS — M25642 Stiffness of left hand, not elsewhere classified: Secondary | ICD-10-CM | POA: Diagnosis not present

## 2021-12-30 ENCOUNTER — Other Ambulatory Visit: Payer: Self-pay | Admitting: Endocrinology

## 2022-01-02 DIAGNOSIS — M25642 Stiffness of left hand, not elsewhere classified: Secondary | ICD-10-CM | POA: Diagnosis not present

## 2022-01-05 DIAGNOSIS — M25642 Stiffness of left hand, not elsewhere classified: Secondary | ICD-10-CM | POA: Diagnosis not present

## 2022-01-09 DIAGNOSIS — M25642 Stiffness of left hand, not elsewhere classified: Secondary | ICD-10-CM | POA: Diagnosis not present

## 2022-01-10 ENCOUNTER — Other Ambulatory Visit: Payer: Self-pay | Admitting: Internal Medicine

## 2022-01-12 DIAGNOSIS — M25642 Stiffness of left hand, not elsewhere classified: Secondary | ICD-10-CM | POA: Diagnosis not present

## 2022-01-16 DIAGNOSIS — M25642 Stiffness of left hand, not elsewhere classified: Secondary | ICD-10-CM | POA: Diagnosis not present

## 2022-01-19 DIAGNOSIS — M25642 Stiffness of left hand, not elsewhere classified: Secondary | ICD-10-CM | POA: Diagnosis not present

## 2022-01-23 DIAGNOSIS — M25642 Stiffness of left hand, not elsewhere classified: Secondary | ICD-10-CM | POA: Diagnosis not present

## 2022-01-26 DIAGNOSIS — M25642 Stiffness of left hand, not elsewhere classified: Secondary | ICD-10-CM | POA: Diagnosis not present

## 2022-01-31 ENCOUNTER — Ambulatory Visit (INDEPENDENT_AMBULATORY_CARE_PROVIDER_SITE_OTHER): Payer: Medicare HMO

## 2022-01-31 DIAGNOSIS — M25642 Stiffness of left hand, not elsewhere classified: Secondary | ICD-10-CM | POA: Diagnosis not present

## 2022-01-31 DIAGNOSIS — Z0000 Encounter for general adult medical examination without abnormal findings: Secondary | ICD-10-CM

## 2022-01-31 NOTE — Progress Notes (Signed)
?I connected with Sonya Brady today by telephone and verified that I am speaking with the correct person using two identifiers. ?Location patient: home ?Location provider: work ?Persons participating in the virtual visit: patient, provider. ?  ?I discussed the limitations, risks, security and privacy concerns of performing an evaluation and management service by telephone and the availability of in person appointments. I also discussed with the patient that there may be a patient responsible charge related to this service. The patient expressed understanding and verbally consented to this telephonic visit.  ?  ?Interactive audio and video telecommunications were attempted between this provider and patient, however failed, due to patient having technical difficulties OR patient did not have access to video capability.  We continued and completed visit with audio only. ? ?Some vital signs may be absent or patient reported.  ? ?Time Spent with patient on telephone encounter: 30 minutes ? ?Subjective:  ? Sonya Brady is a 71 y.o. female who presents for Medicare Annual (Subsequent) preventive examination. ? ?Review of Systems    ? ?Cardiac Risk Factors include: advanced age (>83mn, >>37women);diabetes mellitus;dyslipidemia;hypertension;family history of premature cardiovascular disease ? ?   ?Objective:  ?  ?There were no vitals filed for this visit. ?There is no height or weight on file to calculate BMI. ? ? ?  01/31/2022  ? 10:05 AM 10/27/2021  ?  5:51 AM 11/01/2019  ?  3:40 PM 12/04/2017  ?  4:22 PM 01/23/2017  ?  7:12 AM 01/16/2017  ? 10:04 AM  ?Advanced Directives  ?Does Patient Have a Medical Advance Directive? Yes Yes Yes No No No  ?Type of Advance Directive Living will;Healthcare Power of [place]     ?Does patient want to make changes to medical advance directive? No - Patient declined       ?Copy of [place]  Chart? No - copy requested No - copy requested      ?Would patient like information on creating a medical advance directive?    No - Patient declined    ? ? ?Current Medications (verified) ?Outpatient Encounter Medications as of 01/31/2022  ?Medication Sig  ? atorvastatin (LIPITOR) 80 MG tablet Take 1 tablet by mouth once daily  ? Blood Glucose Monitoring Suppl (ONETOUCH VERIO) w/Device KIT Use As Directed  ? latanoprost (XALATAN) 0.005 % ophthalmic solution Place 1 drop into both eyes at bedtime.  ? levothyroxine (SYNTHROID) 112 MCG tablet TAKE 1 TABLET BY MOUTH ONCE DAILY BEFORE BREAKFAST  ? lisinopril-hydrochlorothiazide (ZESTORETIC) 20-25 MG tablet Take 1 tablet by mouth once daily  ? Melatonin 10 MG TABS Take by mouth at bedtime as needed.  ? metFORMIN (GLUCOPHAGE) 500 MG tablet TAKE 1 TABLET BY MOUTH TWICE DAILY WITH A MEAL  ? montelukast (SINGULAIR) 10 MG tablet Take 10 mg by mouth at bedtime.  ? Multiple Vitamins-Minerals (MULTIVITAMIN PO) Take by mouth. 2 chew tabs daily  ? omeprazole (PRILOSEC) 20 MG capsule Take 1 capsule (20 mg total) by mouth daily. Office visit for further refills  ? OneTouch Delica Lancets 379KMISC USE 1  TO CHECK GLUCOSE ONCE DAILY  ? ONETOUCH VERIO test strip USE 1 STRIP TO CHECK GLUCOSE ONCE DAILY (Patient taking differently: Machine broke as of 10-24-2021)  ? timolol (BETIMOL) 0.5 % ophthalmic solution 1 drop 2 (two) times daily.  ? UNABLE TO FIND Clindomycin ear drop 4 drops 3 x week to left ear on Monday Wednesday and  friday  ? ?No facility-administered encounter medications on file as of 01/31/2022.  ? ? ?Allergies (verified) ?Flagyl [metronidazole] and No known allergies  ? ?History: ?Past Medical History:  ?Diagnosis Date  ? Acne   ? Allergy   ? Arthritis   ? Carpal tunnel syndrome of left wrist 10/21/2021  ? Complication of anesthesia   ? could hear sounds with r breast biopsy yrs ago,  ? Depression   ? situational when husband was sick and dying.  ? Diabetes mellitus Type 2    ? Diverticulosis   ? GERD (gastroesophageal reflux disease)   ? Goiter   ? benign   right 5.7 cm/3.0x1.8cm left 5.2 x 2.4 x 2.1 cm per 05-09-2019 [place] epic no further follow up needed  ? H. pylori infection   ? History of COVID-19 December 15, 2018  ? sick x 1 week all symptoms resolved  ? Hypercholesteremia   ? Hypertension   ? Hypothyroidism   ? Insomnia   ? PONV (postoperative nausea and vomiting)   ? x 1 yrs ago per pt on 10-24-2021  ? Tinnitus, left ear 10/24/2021  ? for last 30 yrs  ? Unspecified Eustachian tube disorder, left ear   ? ?Past Surgical History:  ?Procedure Laterality Date  ? ABDOMINAL HYSTERECTOMY    ? both ovary intact age 30  ? BREAST LUMPECTOMY Bilateral   ? benign mmore than 35 yrs ago  ? CARPAL TUNNEL RELEASE Left 10/27/2021  ? Procedure: CARPAL TUNNEL RELEASE;  Surgeon: Orene Desanctis, MD;  Location: [facility];  Service: Orthopedics;  Laterality: Left;  ? carpel tunnel surgery Right   ? right 30 yrs ago per pt on 10-24-2021  ? CARPOMETACARPEL SUSPENSION PLASTY Left 10/27/2021  ? Procedure: left thumb carpometacarpal joint arthroplasty with tendon transfer;  Surgeon: Orene Desanctis, MD;  Location: [facility];  Service: Orthopedics;  Laterality: Left;  with MAC  ? EYE SURGERY    ? cataracts yrs ago per pt on 10-24-2021  ? left ear eardrum reconstruction    ? ponv after 40 yrs ago  ? right lower abdominal mass benign removed  1978  ? TYMPANOPLASTY    ? tube left ear multiple times ober last 44 years  ? UPPER GI ENDOSCOPY    ? Dec 15, 2012 and 12-15-16  ? ?Family History  ?Problem Relation Age of Onset  ? Other Father 40  ?     deceased, unknown causes in 12/15/2022  ? Hypertension Mother   ? Other Mother 92  ?     SBO, deceased  ? Thyroid disease Brother   ? Thyroid disease Sister   ? Breast cancer Neg Hx   ? Colon cancer Neg Hx   ? Esophageal cancer Neg Hx   ? Rectal cancer Neg Hx   ? Stomach cancer Neg Hx   ? Diabetes Neg Hx   ? ?Social History  ? ?Socioeconomic History  ? Marital status: Single  ?   Spouse name: Not on file  ? Number of children: 2  ? Years of education: Not on file  ? Highest education level: Not on file  ?Occupational History  ? Occupation: bookkeeper  ?Tobacco Use  ? Smoking status: Never  ? Smokeless tobacco: Never  ?Vaping Use  ? Vaping Use: Never used  ?Substance and Sexual Activity  ? Alcohol use: Yes  ?  Comment: social  ? Drug use: No  ? Sexual activity: Not on file  ?Other Topics Concern  ? Not on  file  ?Social History Narrative  ? Married '72-widowed '97  ? [employer]  ? 1 daughter '81, 1 son '83: son going thru divorce ['09]; applying to medical school  ? Work: bookkeeper [employer]  ? ?Social Determinants of Health  ? ?Financial Resource Strain: Low Risk   ? Difficulty of Paying Living Expenses: Not hard at all  ?Food Insecurity: No Food Insecurity  ? Worried About Charity fundraiser in the Last Year: Never true  ? Ran Out of Food in the Last Year: Never true  ?Transportation Needs: No Transportation Needs  ? Lack of Transportation (Medical): No  ? Lack of Transportation (Non-Medical): No  ?Physical Activity: Sufficiently Active  ? Days of Exercise per Week: 5 days  ? Minutes of Exercise per Session: 30 min  ?Stress: No Stress Concern Present  ? Feeling of Stress : Not at all  ?Social Connections: Moderately Integrated  ? Frequency of Communication with Friends and Family: More than three times a week  ? Frequency of Social Gatherings with Friends and Family: More than three times a week  ? Attends Religious Services: More than 4 times per year  ? Active Member of Clubs or Organizations: Yes  ? Attends Archivist Meetings: More than 4 times per year  ? Marital Status: Widowed  ? ? ?Tobacco Counseling ?Counseling given: Not Answered ? ? ?Clinical Intake: ? ?Pre-visit preparation completed: Yes ? ?Pain : No/denies pain ? ?  ? ?Nutritional Risks: None ?Diabetes: Yes ?CBG done?: No ?Did pt. bring in CBG monitor from home?: No ? ?How often do you need to have someone help you  when you read instructions, pamphlets, or other written materials from your doctor or pharmacy?: 1 - Never ?What is the last grade level you completed in school?: HS[employer] ? ?Diabetic? yes ? ?Interpreter N

## 2022-01-31 NOTE — Patient Instructions (Signed)
Ms. Bumpass , ?Thank you for taking time to come for your Medicare Wellness Visit. I appreciate your ongoing commitment to your health goals. Please review the following plan we discussed and let me know if I can assist you in the future.  ? ?Screening recommendations/referrals: ?Colonoscopy: 01/23/2017; due every 10 years ?Mammogram: scheduled for 02/2022 ?Bone Density: 06/23/2014; due every 3 years (overdue) ?Recommended yearly ophthalmology/optometry visit for glaucoma screening and checkup ?Recommended yearly dental visit for hygiene and checkup ? ?Vaccinations: ?Influenza vaccine: 07/28/2021 ?Pneumococcal vaccine: 07/09/2015, 07/18/2016 ?Tdap vaccine: 08/14/2021; due every 10 years ?Shingles vaccine: 09/26/2021, 12/12/2021   ?Covid-19: 12/24/2019, 01/14/2020, 07/20/2020, 02/24/2021 ? ?Advanced directives: Yes; Please bring a copy of your health care power of attorney and living will to the office at your convenience. ? ?Conditions/risks identified: Yes ? ?Next appointment: Please schedule your next Medicare Wellness Visit with your Nurse Health Advisor in 1 year by calling [phone]. ? ? ?Preventive Care 21 Years and Older, Female ?Preventive care refers to lifestyle choices and visits with your health care provider that can promote health and wellness. ?What does preventive care include? ?A yearly physical exam. This is also called an annual well check. ?Dental exams once or twice a year. ?Routine eye exams. Ask your health care provider how often you should have your eyes checked. ?Personal lifestyle choices, including: ?Daily care of your teeth and gums. ?Regular physical activity. ?Eating a healthy diet. ?Avoiding tobacco and drug use. ?Limiting alcohol use. ?Practicing safe sex. ?Taking low-dose aspirin every day. ?Taking vitamin and mineral supplements as recommended by your health care provider. ?What happens during an annual well check? ?The services and screenings done by your health care provider during your  annual well check will depend on your age, overall health, lifestyle risk factors, and family history of disease. ?Counseling  ?Your health care provider may ask you questions about your: ?Alcohol use. ?Tobacco use. ?Drug use. ?Emotional well-being. ?Home and relationship well-being. ?Sexual activity. ?Eating habits. ?History of falls. ?Memory and ability to understand (cognition). ?Work and work Statistician. ?Reproductive health. ?Screening  ?You may have the following tests or measurements: ?Height, weight, and BMI. ?Blood pressure. ?Lipid and cholesterol levels. These may be checked every 5 years, or more frequently if you are over 73 years old. ?Skin check. ?Lung cancer screening. You may have this screening every year starting at age 41 if you have a 30-pack-year history of smoking and currently smoke or have quit within the past 15 years. ?Fecal occult blood test (FOBT) of the stool. You may have this test every year starting at age 19. ?Flexible sigmoidoscopy or colonoscopy. You may have a sigmoidoscopy every 5 years or a colonoscopy every 10 years starting at age 57. ?Hepatitis C blood test. ?Hepatitis B blood test. ?Sexually transmitted disease (STD) testing. ?Diabetes screening. This is done by checking your blood sugar (glucose) after you have not eaten for a while (fasting). You may have this done every 1-3 years. ?Bone density scan. This is done to screen for osteoporosis. You may have this done starting at age 61. ?Mammogram. This may be done every 1-2 years. Talk to your health care provider about how often you should have regular mammograms. ?Talk with your health care provider about your test results, treatment options, and if necessary, the need for more tests. ?Vaccines  ?Your health care provider may recommend certain vaccines, such as: ?Influenza vaccine. This is recommended every year. ?Tetanus, diphtheria, and acellular pertussis (Tdap, Td) vaccine. You may need a Td  booster every 10  years. ?Zoster vaccine. You may need this after age 69. ?Pneumococcal 13-valent conjugate (PCV13) vaccine. One dose is recommended after age 15. ?Pneumococcal polysaccharide (PPSV23) vaccine. One dose is recommended after age 19. ?Talk to your health care provider about which screenings and vaccines you need and how often you need them. ?This information is not intended to replace advice given to you by your health care provider. Make sure you discuss any questions you have with your health care provider. ?Document Released: 10/22/2015 Document Revised: 06/14/2016 Document Reviewed: 07/27/2015 ?Elsevier Interactive Patient Education ? 2017 [place]. ? ?Fall Prevention in the Home ?Falls can cause injuries. They can happen to people of all ages. There are many things you can do to make your home safe and to help prevent falls. ?What can I do on the outside of my home? ?Regularly fix the edges of walkways and driveways and fix any cracks. ?Remove anything that might make you trip as you walk through a door, such as a raised step or threshold. ?Trim any bushes or trees on the path to your home. ?Use bright outdoor lighting. ?Clear any walking paths of anything that might make someone trip, such as rocks or tools. ?Regularly check to see if handrails are loose or broken. Make sure that both sides of any steps have handrails. ?Any raised decks and porches should have guardrails on the edges. ?Have any leaves, snow, or ice cleared regularly. ?Use sand or salt on walking paths during winter. ?Clean up any spills in your garage right away. This includes oil or grease spills. ?What can I do in the bathroom? ?Use night lights. ?Install grab bars by the toilet and in the tub and shower. Do not use towel bars as grab bars. ?Use non-skid mats or decals in the tub or shower. ?If you need to sit down in the shower, use a plastic, non-slip stool. ?Keep the floor dry. Clean up any water that spills on the floor as soon as it  happens. ?Remove soap buildup in the tub or shower regularly. ?Attach bath mats securely with double-sided non-slip rug tape. ?Do not have throw rugs and other things on the floor that can make you trip. ?What can I do in the bedroom? ?Use night lights. ?Make sure that you have a light by your bed that is easy to reach. ?Do not use any sheets or blankets that are too big for your bed. They should not hang down onto the floor. ?Have a firm chair that has side arms. You can use this for support while you get dressed. ?Do not have throw rugs and other things on the floor that can make you trip. ?What can I do in the kitchen? ?Clean up any spills right away. ?Avoid walking on wet floors. ?Keep items that you use a lot in easy-to-reach places. ?If you need to reach something above you, use a strong step stool that has a grab bar. ?Keep electrical cords out of the way. ?Do not use floor polish or wax that makes floors slippery. If you must use wax, use non-skid floor wax. ?Do not have throw rugs and other things on the floor that can make you trip. ?What can I do with my stairs? ?Do not leave any items on the stairs. ?Make sure that there are handrails on both sides of the stairs and use them. Fix handrails that are broken or loose. Make sure that handrails are as long as the stairways. ?Check any carpeting  to make sure that it is firmly attached to the stairs. Fix any carpet that is loose or worn. ?Avoid having throw rugs at the top or bottom of the stairs. If you do have throw rugs, attach them to the floor with carpet tape. ?Make sure that you have a light switch at the top of the stairs and the bottom of the stairs. If you do not have them, ask someone to add them for you. ?What else can I do to help prevent falls? ?Wear shoes that: ?Do not have high heels. ?Have rubber bottoms. ?Are comfortable and fit you well. ?Are closed at the toe. Do not wear sandals. ?If you use a stepladder: ?Make sure that it is fully opened.  Do not climb a closed stepladder. ?Make sure that both sides of the stepladder are locked into place. ?Ask someone to hold it for you, if possible. ?Clearly mark and make sure that you can see: ?Any grab bars or

## 2022-02-02 ENCOUNTER — Ambulatory Visit: Payer: Medicare HMO | Admitting: Endocrinology

## 2022-02-02 VITALS — BP 148/88 | HR 83 | Ht 65.0 in | Wt 175.2 lb

## 2022-02-02 DIAGNOSIS — E118 Type 2 diabetes mellitus with unspecified complications: Secondary | ICD-10-CM

## 2022-02-02 DIAGNOSIS — M25642 Stiffness of left hand, not elsewhere classified: Secondary | ICD-10-CM | POA: Diagnosis not present

## 2022-02-02 LAB — POCT GLYCOSYLATED HEMOGLOBIN (HGB A1C): Hemoglobin A1C: 6.3 % — AB (ref 4.0–5.6)

## 2022-02-02 MED ORDER — METFORMIN HCL ER 500 MG PO TB24: 1500.0000 mg | ORAL_TABLET | Freq: Every day | ORAL | 2 refills | Status: DC

## 2022-02-02 NOTE — Patient Instructions (Addendum)
I have sent a prescription to your pharmacy, to increase the metformin.   ?You could have an endocrinology follow-up appointment in 6 months, or Dr Sharlet Salina will be happy to help you.   ? ?

## 2022-02-02 NOTE — Progress Notes (Signed)
? ?Subjective:  ? ? Patient ID: Sonya Brady, female    DOB: [birth year], 71 y.o.   MRN: [id number] ? ?HPI ?Pt returns for f/u of diabetes mellitus:  ?DM type: 2 ?Dx'ed: 2017 ?Complications: none ?Therapy: metformin. ?GDM: never ?DKA: never ?Severe hypoglycemia: never ?Pancreatitis: never ?Pancreatic imaging: normal on 2019 CT.   ?Other: pt states she feels well in general.  She says cbg's are well-controlled.   ?She also has hypothyroidism (dx'ed 58; [place] in 2020 showed heterogeneous enlarged thyroid compatible with small goiter--bx or f/u not needed).  Pt says she will have labs with Dr Sharlet Salina next week.   ?Past Medical History:  ?Diagnosis Date  ? Acne   ? Allergy   ? Arthritis   ? Carpal tunnel syndrome of left wrist 10/21/2021  ? Complication of anesthesia   ? could hear sounds with r breast biopsy yrs ago,  ? Depression   ? situational when husband was sick and dying.  ? Diabetes mellitus Type 2   ? Diverticulosis   ? GERD (gastroesophageal reflux disease)   ? Goiter   ? benign   right 5.7 cm/3.0x1.8cm left 5.2 x 2.4 x 2.1 cm per 05-09-2019 [place] epic no further follow up needed  ? H. pylori infection   ? History of COVID-19 2020  ? sick x 1 week all symptoms resolved  ? Hypercholesteremia   ? Hypertension   ? Hypothyroidism   ? Insomnia   ? PONV (postoperative nausea and vomiting)   ? x 1 yrs ago per pt on 10-24-2021  ? Tinnitus, left ear 10/24/2021  ? for last 30 yrs  ? Unspecified Eustachian tube disorder, left ear   ? ? ?Past Surgical History:  ?Procedure Laterality Date  ? ABDOMINAL HYSTERECTOMY    ? both ovary intact age 72  ? BREAST LUMPECTOMY Bilateral   ? benign mmore than 35 yrs ago  ? CARPAL TUNNEL RELEASE Left 10/27/2021  ? Procedure: CARPAL TUNNEL RELEASE;  Surgeon: Orene Desanctis, MD;  Location: [facility];  Service: Orthopedics;  Laterality: Left;  ? carpel tunnel surgery Right   ? right 30 yrs ago per pt on 10-24-2021  ? CARPOMETACARPEL SUSPENSION PLASTY Left 10/27/2021  ? Procedure:  left thumb carpometacarpal joint arthroplasty with tendon transfer;  Surgeon: Orene Desanctis, MD;  Location: [facility];  Service: Orthopedics;  Laterality: Left;  with MAC  ? EYE SURGERY    ? cataracts yrs ago per pt on 10-24-2021  ? left ear eardrum reconstruction    ? ponv after 40 yrs ago  ? right lower abdominal mass benign removed  1978  ? TYMPANOPLASTY    ? tube left ear multiple times ober last 44 years  ? UPPER GI ENDOSCOPY    ? 2014 and 2018  ? ? ?Social History  ? ?Socioeconomic History  ? Marital status: Single  ?  Spouse name: Not on file  ? Number of children: 2  ? Years of education: Not on file  ? Highest education level: Not on file  ?Occupational History  ? Occupation: bookkeeper  ?Tobacco Use  ? Smoking status: Never  ? Smokeless tobacco: Never  ?Vaping Use  ? Vaping Use: Never used  ?Substance and Sexual Activity  ? Alcohol use: Yes  ?  Comment: social  ? Drug use: No  ? Sexual activity: Not on file  ?Other Topics Concern  ? Not on file  ?Social History Narrative  ? Married '72-widowed '97  ? HSG  ?  1 daughter 11/22/1979, 1 son 21-Nov-1981: son going thru divorce ['09]; applying to medical school  ? Work: bookkeeper [employer]  ? ?Social Determinants of Health  ? ?Financial Resource Strain: Low Risk   ? Difficulty of Paying Living Expenses: Not hard at all  ?Food Insecurity: No Food Insecurity  ? Worried About Charity fundraiser in the Last Year: Never true  ? Ran Out of Food in the Last Year: Never true  ?Transportation Needs: No Transportation Needs  ? Lack of Transportation (Medical): No  ? Lack of Transportation (Non-Medical): No  ?Physical Activity: Sufficiently Active  ? Days of Exercise per Week: 5 days  ? Minutes of Exercise per Session: 30 min  ?Stress: No Stress Concern Present  ? Feeling of Stress : Not at all  ?Social Connections: Moderately Integrated  ? Frequency of Communication with Friends and Family: More than three times a week  ? Frequency of Social Gatherings with Friends  and Family: More than three times a week  ? Attends Religious Services: More than 4 times per year  ? Active Member of Clubs or Organizations: Yes  ? Attends Archivist Meetings: More than 4 times per year  ? Marital Status: Widowed  ?Intimate Partner Violence: Not At Risk  ? Fear of Current or Ex-Partner: No  ? Emotionally Abused: No  ? Physically Abused: No  ? Sexually Abused: No  ? ? ?Current Outpatient Medications on File Prior to Visit  ?Medication Sig Dispense Refill  ? atorvastatin (LIPITOR) 80 MG tablet Take 1 tablet by mouth once daily 90 tablet 0  ? Blood Glucose Monitoring Suppl (ONETOUCH VERIO) w/Device KIT Use As Directed 1 kit 0  ? latanoprost (XALATAN) 0.005 % ophthalmic solution Place 1 drop into both eyes at bedtime.    ? levothyroxine (SYNTHROID) 112 MCG tablet TAKE 1 TABLET BY MOUTH ONCE DAILY BEFORE BREAKFAST 90 tablet 0  ? lisinopril-hydrochlorothiazide (ZESTORETIC) 20-25 MG tablet Take 1 tablet by mouth once daily 30 tablet 0  ? Melatonin 10 MG TABS Take by mouth at bedtime as needed.    ? montelukast (SINGULAIR) 10 MG tablet Take 10 mg by mouth at bedtime.    ? Multiple Vitamins-Minerals (MULTIVITAMIN PO) Take by mouth. 2 chew tabs daily    ? omeprazole (PRILOSEC) 20 MG capsule Take 1 capsule (20 mg total) by mouth daily. Office visit for further refills 90 capsule 0  ? OneTouch Delica Lancets 50N MISC USE 1  TO CHECK GLUCOSE ONCE DAILY 100 each 0  ? ONETOUCH VERIO test strip USE 1 STRIP TO CHECK GLUCOSE ONCE DAILY (Patient taking differently: Machine broke as of 10-24-2021) 75 each 0  ? timolol (BETIMOL) 0.5 % ophthalmic solution 1 drop 2 (two) times daily.    ? UNABLE TO FIND Clindomycin ear drop 4 drops 3 x week to left ear on Monday Wednesday and friday    ? ?No current facility-administered medications on file prior to visit.  ? ? ?Allergies  ?Allergen Reactions  ? Flagyl [Metronidazole]   ?  Redness on arms  ? No Known Allergies   ? ? ?Family History  ?Problem Relation Age of  Onset  ? Other Father 74  ?     deceased, unknown causes in 11-21-22  ? Hypertension Mother   ? Other Mother 20  ?     SBO, deceased  ? Thyroid disease Brother   ? Thyroid disease Sister   ? Breast cancer Neg Hx   ? Colon cancer  Neg Hx   ? Esophageal cancer Neg Hx   ? Rectal cancer Neg Hx   ? Stomach cancer Neg Hx   ? Diabetes Neg Hx   ? ? ?BP (!) 148/88 (BP Location: Left Arm, Patient Position: Sitting, Cuff Size: Normal)   Pulse 83   Ht _0  (1.651 m)   Wt 175 lb 3.2 oz (79.5 kg)   SpO2 98%   BMI 29.15 kg/m?  ? ? ?Review of Systems ? ?   ?Objective:  ? Physical Exam ?VITAL SIGNS:  See vs page.   ?GENERAL: no distress.  ? ? ?Lab Results  ?Component Value Date  ? CREATININE 0.70 10/27/2021  ? BUN 13 10/27/2021  ? NA 139 10/27/2021  ? K 3.5 10/27/2021  ? CL 103 10/27/2021  ? CO2 28 01/13/2021  ? ?Lab Results  ?Component Value Date  ? TSH 2.04 01/13/2021  ? ? ?A1c=6.3% ?   ?Assessment & Plan:  ?Type 2 DM: uncontrolled, as goal A1c is 6.0% here ? ?Patient Instructions  ?I have sent a prescription to your pharmacy, to increase the metformin.   ?You could have an endocrinology follow-up appointment in 6 months, or Dr Sharlet Salina will be happy to help you.   ? ? ? ?

## 2022-02-07 ENCOUNTER — Encounter: Payer: Self-pay | Admitting: Internal Medicine

## 2022-02-07 ENCOUNTER — Ambulatory Visit (INDEPENDENT_AMBULATORY_CARE_PROVIDER_SITE_OTHER): Payer: Medicare HMO | Admitting: Internal Medicine

## 2022-02-07 VITALS — BP 124/62 | HR 80 | Resp 18 | Ht 65.0 in | Wt 174.8 lb

## 2022-02-07 DIAGNOSIS — E049 Nontoxic goiter, unspecified: Secondary | ICD-10-CM | POA: Diagnosis not present

## 2022-02-07 DIAGNOSIS — K219 Gastro-esophageal reflux disease without esophagitis: Secondary | ICD-10-CM | POA: Diagnosis not present

## 2022-02-07 DIAGNOSIS — E1169 Type 2 diabetes mellitus with other specified complication: Secondary | ICD-10-CM | POA: Diagnosis not present

## 2022-02-07 DIAGNOSIS — Z0000 Encounter for general adult medical examination without abnormal findings: Secondary | ICD-10-CM

## 2022-02-07 DIAGNOSIS — I10 Essential (primary) hypertension: Secondary | ICD-10-CM

## 2022-02-07 DIAGNOSIS — E039 Hypothyroidism, unspecified: Secondary | ICD-10-CM | POA: Diagnosis not present

## 2022-02-07 DIAGNOSIS — D1721 Benign lipomatous neoplasm of skin and subcutaneous tissue of right arm: Secondary | ICD-10-CM

## 2022-02-07 DIAGNOSIS — E785 Hyperlipidemia, unspecified: Secondary | ICD-10-CM

## 2022-02-07 DIAGNOSIS — E118 Type 2 diabetes mellitus with unspecified complications: Secondary | ICD-10-CM

## 2022-02-07 LAB — COMPREHENSIVE METABOLIC PANEL
ALT: 20 U/L (ref 0–35)
AST: 20 U/L (ref 0–37)
Albumin: 4.7 g/dL (ref 3.5–5.2)
Alkaline Phosphatase: 80 U/L (ref 39–117)
BUN: 16 mg/dL (ref 6–23)
CO2: 27 mEq/L (ref 19–32)
Calcium: 9.8 mg/dL (ref 8.4–10.5)
Chloride: 101 mEq/L (ref 96–112)
Creatinine, Ser: 0.98 mg/dL (ref 0.40–1.20)
GFR: 57.74 mL/min — ABNORMAL LOW (ref >60.00)
Glucose, Bld: 99 mg/dL (ref 70–99)
Potassium: 3.7 mEq/L (ref 3.5–5.1)
Sodium: 137 mEq/L (ref 135–145)
Total Bilirubin: 0.5 mg/dL (ref 0.2–1.2)
Total Protein: 7.6 g/dL (ref 6.0–8.3)

## 2022-02-07 LAB — LIPID PANEL
Cholesterol: 156 mg/dL (ref 0–200)
HDL: 47.7 mg/dL (ref >39.00)
LDL Cholesterol: 73 mg/dL (ref 0–99)
NonHDL: 108.59
Total CHOL/HDL Ratio: 3
Triglycerides: 179 mg/dL — ABNORMAL HIGH (ref 0.0–149.0)
VLDL: 35.8 mg/dL (ref 0.0–40.0)

## 2022-02-07 LAB — CBC
HCT: 38.6 % (ref 36.0–46.0)
Hemoglobin: 13.1 g/dL (ref 12.0–15.0)
MCHC: 34 g/dL (ref 30.0–36.0)
MCV: 88.3 fl (ref 78.0–100.0)
Platelets: 217 10*3/uL (ref 150.0–400.0)
RBC: 4.37 Mil/uL (ref 3.87–5.11)
RDW: 13.7 % (ref 11.5–15.5)
WBC: 6.8 10*3/uL (ref 4.0–10.5)

## 2022-02-07 LAB — MICROALBUMIN / CREATININE URINE RATIO
Creatinine,U: 85 mg/dL
Microalb Creat Ratio: 1.1 mg/g (ref 0.0–30.0)
Microalb, Ur: 0.9 mg/dL (ref 0.0–1.9)

## 2022-02-07 LAB — TSH: TSH: 2.19 u[IU]/mL (ref 0.35–5.50)

## 2022-02-07 MED ORDER — METFORMIN HCL ER 500 MG PO TB24: 1500.0000 mg | ORAL_TABLET | Freq: Every day | ORAL | 3 refills | Status: DC

## 2022-02-07 MED ORDER — OMEPRAZOLE 20 MG PO CPDR: 20.0000 mg | DELAYED_RELEASE_CAPSULE | Freq: Every day | ORAL | 3 refills | Status: DC

## 2022-02-07 MED ORDER — LISINOPRIL-HYDROCHLOROTHIAZIDE 20-25 MG PO TABS: 1.0000 | ORAL_TABLET | Freq: Every day | ORAL | 3 refills | Status: DC

## 2022-02-07 MED ORDER — LEVOTHYROXINE SODIUM 112 MCG PO TABS: 112.0000 ug | ORAL_TABLET | Freq: Every day | ORAL | 3 refills | Status: DC

## 2022-02-07 MED ORDER — ATORVASTATIN CALCIUM 80 MG PO TABS: 80.0000 mg | ORAL_TABLET | Freq: Every day | ORAL | 3 refills | Status: DC

## 2022-02-07 NOTE — Progress Notes (Signed)
? ?  Subjective:  ? ?Patient ID: Sonya Brady, female    DOB: 1950/12/10, 71 y.o.   MRN: [id number] ? ?HPI ?The patient is here for physical. ? ?PMH, [facility], social history reviewed and updated ? ?Review of Systems  ?Constitutional: Negative.   ?HENT: Negative.    ?Eyes: Negative.   ?Respiratory:  Negative for cough, chest tightness and shortness of breath.   ?Cardiovascular:  Negative for chest pain, palpitations and leg swelling.  ?Gastrointestinal:  Negative for abdominal distention, abdominal pain, constipation, diarrhea, nausea and vomiting.  ?Musculoskeletal:  Positive for arthralgias.  ?Skin: Negative.   ?Neurological: Negative.   ?Psychiatric/Behavioral: Negative.    ? ?Objective:  ?Physical Exam ?Constitutional:   ?   Appearance: She is well-developed.  ?HENT:  ?   Head: Normocephalic and atraumatic.  ?Cardiovascular:  ?   Rate and Rhythm: Normal rate and regular rhythm.  ?Pulmonary:  ?   Effort: Pulmonary effort is normal. No respiratory distress.  ?   Breath sounds: Normal breath sounds. No wheezing or rales.  ?Abdominal:  ?   General: Bowel sounds are normal. There is no distension.  ?   Palpations: Abdomen is soft.  ?   Tenderness: There is no abdominal tenderness. There is no rebound.  ?Musculoskeletal:  ?   Cervical back: Normal range of motion.  ?Skin: ?   General: Skin is warm and dry.  ?Neurological:  ?   Mental Status: She is alert and oriented to person, place, and time.  ?   Coordination: Coordination normal.  ? ? ?Vitals:  ? 02/07/22 1441  ?BP: 124/62  ?Pulse: 80  ?Resp: 18  ?SpO2: 98%  ?Weight: 174 lb 12.8 oz (79.3 kg)  ?Height: '5\' 5"'$  (1.651 m)  ? ? ?This visit occurred during the SARS-CoV-2 public health emergency.  Safety protocols were in place, including screening questions prior to the visit, additional usage of staff PPE, and extensive cleaning of exam room while observing appropriate contact time as indicated for disinfecting solutions.  ? ?Assessment & Plan:  ? ?

## 2022-02-07 NOTE — Assessment & Plan Note (Signed)
Referral to general surgery for removal. Is getting pain due to bra strap and location. ?

## 2022-02-07 NOTE — Assessment & Plan Note (Signed)
BP at goal on lisinopril/hctz 20/25 mg daily. Checking CMP and adjust as needed.  ?

## 2022-02-07 NOTE — Assessment & Plan Note (Signed)
Reviewed [place] results from 2020 with her and no repeat indicated at this time.  ?

## 2022-02-07 NOTE — Assessment & Plan Note (Signed)
Checking microalbumin to creatinine ratio and CMP and lipid panel. Eye exam up to date. Taking metformin 1000 mg daily xr. We will take over her diabetes care now that her endocrinologist is leaving. Last HgA1c 6.3 within 2 weeks.  ?

## 2022-02-07 NOTE — Assessment & Plan Note (Signed)
Controlled on omeprazole 20 mg daily, refilled today will plan to continue. ?

## 2022-02-07 NOTE — Assessment & Plan Note (Signed)
Flu shot yearly. Covid-19 counseled. Pneumonia complete. Shingrix complete. Tetanus up to date. Colonoscopy up to date. Mammogram up to date, pap smear aged out and dexa complete. Counseled about sun safety and mole surveillance. Counseled about the dangers of distracted driving. Given 10 year screening recommendations.   

## 2022-02-07 NOTE — Assessment & Plan Note (Signed)
Checking lipid panel and adjust atorvastatin 80 mg daily as needed.  ?

## 2022-02-07 NOTE — Patient Instructions (Signed)
We will get you in with the surgeon to remove the spot on the shoulder.  ?

## 2022-02-07 NOTE — Assessment & Plan Note (Signed)
Checking TSH and adjust synthroid 112 mcg daily as needed. No clinical symptoms of over or under replacement.  ?

## 2022-02-08 DIAGNOSIS — M25642 Stiffness of left hand, not elsewhere classified: Secondary | ICD-10-CM | POA: Diagnosis not present

## 2022-02-10 DIAGNOSIS — M25642 Stiffness of left hand, not elsewhere classified: Secondary | ICD-10-CM | POA: Diagnosis not present

## 2022-02-13 DIAGNOSIS — M25642 Stiffness of left hand, not elsewhere classified: Secondary | ICD-10-CM | POA: Diagnosis not present

## 2022-02-15 DIAGNOSIS — N816 Rectocele: Secondary | ICD-10-CM | POA: Diagnosis not present

## 2022-02-15 DIAGNOSIS — Z1231 Encounter for screening mammogram for malignant neoplasm of breast: Secondary | ICD-10-CM | POA: Diagnosis not present

## 2022-02-15 DIAGNOSIS — N8110 Cystocele, unspecified: Secondary | ICD-10-CM | POA: Diagnosis not present

## 2022-02-15 DIAGNOSIS — Z01419 Encounter for gynecological examination (general) (routine) without abnormal findings: Secondary | ICD-10-CM | POA: Diagnosis not present

## 2022-02-15 DIAGNOSIS — E079 Disorder of thyroid, unspecified: Secondary | ICD-10-CM | POA: Diagnosis not present

## 2022-02-17 DIAGNOSIS — M25642 Stiffness of left hand, not elsewhere classified: Secondary | ICD-10-CM | POA: Diagnosis not present

## 2022-02-22 DIAGNOSIS — H7312 Chronic myringitis, left ear: Secondary | ICD-10-CM | POA: Diagnosis not present

## 2022-02-22 DIAGNOSIS — H6522 Chronic serous otitis media, left ear: Secondary | ICD-10-CM | POA: Diagnosis not present

## 2022-02-22 DIAGNOSIS — M25642 Stiffness of left hand, not elsewhere classified: Secondary | ICD-10-CM | POA: Diagnosis not present

## 2022-02-27 DIAGNOSIS — M25642 Stiffness of left hand, not elsewhere classified: Secondary | ICD-10-CM | POA: Diagnosis not present

## 2022-03-01 DIAGNOSIS — M1812 Unilateral primary osteoarthritis of first carpometacarpal joint, left hand: Secondary | ICD-10-CM | POA: Diagnosis not present

## 2022-03-01 DIAGNOSIS — G5602 Carpal tunnel syndrome, left upper limb: Secondary | ICD-10-CM | POA: Diagnosis not present

## 2022-03-02 DIAGNOSIS — M25642 Stiffness of left hand, not elsewhere classified: Secondary | ICD-10-CM | POA: Diagnosis not present

## 2022-03-08 DIAGNOSIS — M25642 Stiffness of left hand, not elsewhere classified: Secondary | ICD-10-CM | POA: Diagnosis not present

## 2022-03-17 ENCOUNTER — Telehealth: Payer: Self-pay | Admitting: Internal Medicine

## 2022-03-17 NOTE — Telephone Encounter (Signed)
1.Medication Requested: levothyroxine (SYNTHROID) 112 MCG tablet 2. Pharmacy (Name, Street, [place]): [place], [place] - [address] Phone:  [phone]  Fax:  [phone]     3. On Med List: yes  4. Last Visit with PCP:  5. Next visit date with PCP:   Pt requesting 3 months supply   Agent: Please be advised that RX refills may take up to 3 business days. We ask that you follow-up with your pharmacy.

## 2022-03-17 NOTE — Telephone Encounter (Signed)
90 day supply with 3 refills was sent on 02/07/2022. Pt has been made aware

## 2022-03-22 DIAGNOSIS — M25642 Stiffness of left hand, not elsewhere classified: Secondary | ICD-10-CM | POA: Diagnosis not present

## 2022-03-29 DIAGNOSIS — M25642 Stiffness of left hand, not elsewhere classified: Secondary | ICD-10-CM | POA: Diagnosis not present

## 2022-05-08 ENCOUNTER — Encounter: Payer: Self-pay | Admitting: Internal Medicine

## 2022-05-08 ENCOUNTER — Ambulatory Visit: Payer: Medicare HMO | Admitting: Internal Medicine

## 2022-05-08 VITALS — BP 124/76 | HR 73 | Ht 66.0 in | Wt 170.0 lb

## 2022-05-08 DIAGNOSIS — K5732 Diverticulitis of large intestine without perforation or abscess without bleeding: Secondary | ICD-10-CM

## 2022-05-08 DIAGNOSIS — R142 Eructation: Secondary | ICD-10-CM

## 2022-05-08 DIAGNOSIS — K219 Gastro-esophageal reflux disease without esophagitis: Secondary | ICD-10-CM

## 2022-05-08 MED ORDER — OMEPRAZOLE 20 MG PO CPDR: 20.0000 mg | DELAYED_RELEASE_CAPSULE | Freq: Every day | ORAL | 3 refills | Status: DC

## 2022-05-08 NOTE — Progress Notes (Signed)
HISTORY OF PRESENT ILLNESS:  Sonya Brady is a 71 y.o. female who is followed in this office for history of GERD complicated by peptic stricture, colon cancer screening, and history of acute diverticulitis.  She was last evaluated in the office September 16, 2020.  See that dictation for details.  She has had no recurrent issues with diverticulitis.  She did have some issues with constipation when she was on Protonix after hand surgery.  This responded to laxatives.  She is up-to-date on screening colonoscopy.  Her last upper endoscopy with esophageal dilation was performed April 2018.  She continues on omeprazole 20 mg daily.  This controls GERD symptoms.  She does complain of burping and belching.  She has questions about her medication.  No recurrent dysphagia.  No additional or new GI complaints.  Review of blood work from Feb 07, 2022 shows unremarkable comprehensive metabolic panel.  Normal CBC with hemoglobin 13.1.  REVIEW OF SYSTEMS:  All non-GI ROS negative unless otherwise stated in the HPI except for arthritis  Past Medical History:  Diagnosis Date   Acne    Allergy    Arthritis    Carpal tunnel syndrome of left wrist 79/39/0300   Complication of anesthesia    could hear sounds with r breast biopsy yrs ago,   Depression    situational when husband was sick and dying.   Diabetes mellitus Type 2    Diverticulosis    GERD (gastroesophageal reflux disease)    Goiter    benign   right 5.7 cm/3.0x1.8cm left 5.2 x 2.4 x 2.1 cm per 05-09-2019 [place] epic no further follow up needed   H. pylori infection    History of COVID-19 2020   sick x 1 week all symptoms resolved   Hypercholesteremia    Hypertension    Hypothyroidism    Insomnia    PONV (postoperative nausea and vomiting)    x 1 yrs ago per pt on 10-24-2021   Tinnitus, left ear 10/24/2021   for last 30 yrs   Unspecified Eustachian tube disorder, left ear     Past Surgical History:  Procedure Laterality Date   ABDOMINAL  HYSTERECTOMY     both ovary intact age 20   BREAST LUMPECTOMY Bilateral    benign mmore than 35 yrs ago   CARPAL TUNNEL RELEASE Left 10/27/2021   Procedure: CARPAL TUNNEL RELEASE;  Surgeon: Orene Desanctis, MD;  Location: [place];  Service: Orthopedics;  Laterality: Left;   carpel tunnel surgery Right    right 30 yrs ago per pt on 10-24-2021   CARPOMETACARPEL SUSPENSION PLASTY Left 10/27/2021   Procedure: left thumb carpometacarpal joint arthroplasty with tendon transfer;  Surgeon: Orene Desanctis, MD;  Location: [facility];  Service: Orthopedics;  Laterality: Left;  with MAC   EYE SURGERY     cataracts yrs ago per pt on 10-24-2021   left ear eardrum reconstruction     ponv after 40 yrs ago   right lower abdominal mass benign removed  1978   TYMPANOPLASTY     tube left ear multiple times ober last 7 years   UPPER GI ENDOSCOPY     2014 and 2018    Social History Sonya Brady  reports that she has never smoked. She has never used smokeless tobacco. She reports current alcohol use. She reports that she does not use drugs.  family history includes Hypertension in her mother; Other (age of onset: 76) in her father; Other (  age of onset: 54) in her mother; Thyroid disease in her brother and sister.  Allergies  Allergen Reactions   Flagyl [Metronidazole]     Redness on arms   No Known Allergies        PHYSICAL EXAMINATION: Vital signs: BP 124/76   Pulse 73   Ht _0  (1.676 m)   Wt 170 lb (77.1 kg)   BMI 27.44 kg/m   Constitutional: generally well-appearing, no acute distress Psychiatric: alert and oriented x3, cooperative Eyes: extraocular movements intact, anicteric, conjunctiva pink Mouth: oral pharynx moist, no lesions Neck: supple no lymphadenopathy Cardiovascular: heart regular rate and rhythm, no murmur Lungs: clear to auscultation bilaterally Abdomen: soft, nontender, nondistended, no obvious ascites, no peritoneal signs, normal bowel  sounds, no organomegaly Rectal: Omitted Extremities: no clubbing, cyanosis, or lower extremity edema bilaterally Skin: no lesions on visible extremities Neuro: No focal deficits.  Cranial nerves intact  ASSESSMENT:  1.  GERD complicated by peptic stricture.  Asymptomatic post dilation on PPI 2.  History of acute diverticulitis.  No recurrent problems since her last bout several years ago 3.  Alternating bowel habits 4.  Belching.  Benign   PLAN:  1.  Reflux precautions 2.  Continue omeprazole 20 mg daily 3.  Refill omeprazole for 1 year.  Medication risks reviewed 4.  High-fiber diet 5.  Discussion on belching.  Reassurance 6.  Screening colonoscopy around 2028 6.  Routine office follow-up 1 year.  Contact the office in the interim for any questions or problems

## 2022-05-08 NOTE — Patient Instructions (Signed)
If you are age 72 or older, your body mass index should be between 23-30. Your Body mass index is 27.44 kg/m. If this is out of the aforementioned range listed, please consider follow up with your Primary Care Provider. ________________________________________________________  The [facility] providers would like to encourage you to use [facility] to communicate with providers for non-urgent requests or questions.  Due to long hold times on the telephone, sending your provider a message by [facility] may be a faster and more efficient way to get a response.  Please allow 48 business hours for a response.  Please remember that this is for non-urgent requests.  _______________________________________________________  We have sent the following medications to your pharmacy for you to pick up at your convenience:  Omeprazole  We will see you back in the office in 1 year.  Please contact our office in 1 year to schedule a follow up appointment.  Thank you for entrusting me with your care and choosing [facility].  Dr Ardis Hughs

## 2022-05-17 DIAGNOSIS — R2231 Localized swelling, mass and lump, right upper limb: Secondary | ICD-10-CM | POA: Diagnosis not present

## 2022-05-29 ENCOUNTER — Encounter (HOSPITAL_BASED_OUTPATIENT_CLINIC_OR_DEPARTMENT_OTHER)
Admission: RE | Admit: 2022-05-29 | Discharge: 2022-05-29 | Disposition: A | Discharge status: Home / Self Care | Payer: Medicare HMO | Source: Ambulatory Visit | Attending: Surgery | Admitting: Surgery

## 2022-05-29 ENCOUNTER — Other Ambulatory Visit: Payer: Self-pay

## 2022-05-29 ENCOUNTER — Encounter (HOSPITAL_BASED_OUTPATIENT_CLINIC_OR_DEPARTMENT_OTHER): Payer: Self-pay | Admitting: Surgery

## 2022-05-29 DIAGNOSIS — Z01812 Encounter for preprocedural laboratory examination: Secondary | ICD-10-CM | POA: Diagnosis not present

## 2022-05-29 DIAGNOSIS — E119 Type 2 diabetes mellitus without complications: Secondary | ICD-10-CM | POA: Diagnosis not present

## 2022-05-29 LAB — BASIC METABOLIC PANEL
Anion gap: 11 (ref 5–15)
BUN: 9 mg/dL (ref 8–23)
CO2: 24 mmol/L (ref 22–32)
Calcium: 9.4 mg/dL (ref 8.9–10.3)
Chloride: 103 mmol/L (ref 98–111)
Creatinine, Ser: 0.85 mg/dL (ref 0.44–1.00)
GFR, Estimated: >60 mL/min (ref >60)
Glucose, Bld: 115 mg/dL — ABNORMAL HIGH (ref 70–99)
Potassium: 2.9 mmol/L — ABNORMAL LOW (ref 3.5–5.1)
Sodium: 138 mmol/L (ref 135–145)

## 2022-06-02 NOTE — Progress Notes (Signed)
Potassium is 2.9. Reviewed with Dr Marcie Bal who ordered Stat K+ recheck on DOS. Called pt and advised her to come here at 1000 (rather than 1100) on Monday and to eat potassium rich foods this weekend. Also encouraged her not to ignore any chest pains or irregular heart beats and to seek emergency care if these are noted. Pt verbalized understanding.

## 2022-06-04 NOTE — H&P (Signed)
REFERRING PHYSICIAN: Charna Brady*  PROVIDER: Joya San, MD  MRN: [id number] DOB: Feb 27, 1951 DATE OF ENCOUNTER: 05/17/2022  Subjective   Chief Complaint: No chief complaint on file.   History of Present Illness: Sonya Brady is a 71 y.o. female who is seen today as an office consultation at the request of Dr. Pricilla Brady for evaluation of mass on the right shoulder  This has been growing in size and is nontender. It is up on the [facility] joint and is freely moveable. I think that it can be excised under MAC at [facility].   Review of Systems: See HPI as well for other ROS.  ROS   Medical History: Past Medical History:  Diagnosis Date  Anemia  Arthritis  Diabetes mellitus without complication (CMS-HCC)  Glaucoma (increased eye pressure)  Hyperlipidemia  Hypertension  Thyroid disease   Patient Active Problem List  Diagnosis  Acute postoperative pain  Bilateral carpal tunnel syndrome  Chronic constipation  Chronic myringitis, left ear  Controlled type 2 diabetes mellitus (CMS-HCC)  Esophageal reflux  Essential hypertension  Hyperlipidemia associated with type 2 diabetes mellitus (CMS-HCC)  Goiter  Lipoma of right shoulder  Pain of toe of left foot  Routine health maintenance   Past Surgical History:  Procedure Laterality Date  carpal tunnel thumb surgery N/A  eardrum repair  HYSTERECTOMY    Allergies  Allergen Reactions  Metronidazole Other (See Comments)  Redness on arms   Current Outpatient Medications on File Prior to Visit  Medication Sig Dispense Refill  atorvastatin (LIPITOR) 80 MG tablet Take by mouth  levothyroxine (SYNTHROID) 112 MCG tablet Take by mouth  lisinopriL-hydroCHLOROthiazide (ZESTORETIC) 20-25 mg tablet Take 1 tablet by mouth once daily  metFORMIN (GLUCOPHAGE) 500 MG tablet TAKE 1 TABLET BY MOUTH TWICE DAILY WITH A MEAL  omeprazole (PRILOSEC) 20 MG DR capsule Take 1 capsule by mouth once daily  latanoprost (XALATAN)  0.005 % ophthalmic solution Apply 1 drop to eye at bedtime  montelukast (SINGULAIR) 10 mg tablet Take 10 mg by mouth at bedtime   No current facility-administered medications on file prior to visit.   Family History  Problem Relation Age of Onset  Diabetes Mother  Heart valve disease Brother  Hyperlipidemia (Elevated cholesterol) Brother  High blood pressure (Hypertension) Brother    Social History   Tobacco Use  Smoking Status Never  Smokeless Tobacco Never    Social History   Socioeconomic History  Marital status: Single  Tobacco Use  Smoking status: Never  Smokeless tobacco: Never  Vaping Use  Vaping Use: Never used  Substance and Sexual Activity  Alcohol use: Never  Drug use: Never   Objective:   Vitals:  05/17/22 0917  BP: 118/78  Pulse: 82  Temp: 36.8 C (98.2 F)  SpO2: 98%  Weight: 79.2 kg (174 lb 9.6 oz)  Height: 167.6 cm (_0 )   Body mass index is 28.18 kg/m.  Physical Exam General: Well maintained F HEENT :Unremarkable Chest: clear  Heart: SR Breast: not examined Extremities over the right A C joint on the should is a soft, golf ball sized mass of the shoulder Neuro alert and oriented x 3  Labs, Imaging and Diagnostic Testing: none  Assessment and Plan:  Diagnoses and all orders for this visit:  Mass of skin of right shoulder    An old patient of Sonya Brady with a lipomatous feeling mass of the right shoulder. For excision at [facility] under [place]  No follow-ups on file.  Sonya Brady  Sonya Pounds, MD

## 2022-06-05 ENCOUNTER — Ambulatory Visit (HOSPITAL_BASED_OUTPATIENT_CLINIC_OR_DEPARTMENT_OTHER)
Admission: RE | Admit: 2022-06-05 | Discharge: 2022-06-05 | Disposition: A | Discharge status: Home / Self Care | Payer: Medicare HMO | Attending: Surgery | Admitting: Surgery

## 2022-06-05 ENCOUNTER — Ambulatory Visit (HOSPITAL_BASED_OUTPATIENT_CLINIC_OR_DEPARTMENT_OTHER): Payer: Medicare HMO | Admitting: Anesthesiology

## 2022-06-05 ENCOUNTER — Other Ambulatory Visit: Payer: Self-pay

## 2022-06-05 ENCOUNTER — Encounter (HOSPITAL_BASED_OUTPATIENT_CLINIC_OR_DEPARTMENT_OTHER)
Admission: RE | Disposition: A | Discharge status: Home / Self Care | Payer: Self-pay | Source: Home / Self Care | Attending: Surgery

## 2022-06-05 ENCOUNTER — Encounter (HOSPITAL_BASED_OUTPATIENT_CLINIC_OR_DEPARTMENT_OTHER): Payer: Self-pay | Admitting: Surgery

## 2022-06-05 DIAGNOSIS — K219 Gastro-esophageal reflux disease without esophagitis: Secondary | ICD-10-CM | POA: Insufficient documentation

## 2022-06-05 DIAGNOSIS — Z794 Long term (current) use of insulin: Secondary | ICD-10-CM | POA: Insufficient documentation

## 2022-06-05 DIAGNOSIS — E118 Type 2 diabetes mellitus with unspecified complications: Secondary | ICD-10-CM

## 2022-06-05 DIAGNOSIS — I10 Essential (primary) hypertension: Secondary | ICD-10-CM | POA: Insufficient documentation

## 2022-06-05 DIAGNOSIS — R2231 Localized swelling, mass and lump, right upper limb: Secondary | ICD-10-CM | POA: Diagnosis not present

## 2022-06-05 DIAGNOSIS — Z7984 Long term (current) use of oral hypoglycemic drugs: Secondary | ICD-10-CM | POA: Diagnosis not present

## 2022-06-05 DIAGNOSIS — Z01818 Encounter for other preprocedural examination: Secondary | ICD-10-CM

## 2022-06-05 DIAGNOSIS — E039 Hypothyroidism, unspecified: Secondary | ICD-10-CM | POA: Insufficient documentation

## 2022-06-05 DIAGNOSIS — E119 Type 2 diabetes mellitus without complications: Secondary | ICD-10-CM | POA: Insufficient documentation

## 2022-06-05 DIAGNOSIS — D1721 Benign lipomatous neoplasm of skin and subcutaneous tissue of right arm: Secondary | ICD-10-CM | POA: Diagnosis not present

## 2022-06-05 HISTORY — PX: EXCISION MASS UPPER EXTREMETIES: SHX6704

## 2022-06-05 LAB — GLUCOSE, CAPILLARY
Glucose-Capillary: 107 mg/dL — ABNORMAL HIGH (ref 70–99)
Glucose-Capillary: 107 mg/dL — ABNORMAL HIGH (ref 70–99)

## 2022-06-05 LAB — POTASSIUM: Potassium: 3.8 mmol/L (ref 3.5–5.1)

## 2022-06-05 SURGERY — EXCISION MASS UPPER EXTREMITIES
Anesthesia: Monitor Anesthesia Care | Laterality: Right

## 2022-06-05 MED ORDER — CHLORHEXIDINE GLUCONATE CLOTH 2 % EX PADS: 6.0000 | MEDICATED_PAD | Freq: Once | CUTANEOUS | Status: DC

## 2022-06-05 MED ORDER — LIDOCAINE-EPINEPHRINE 1 %-1:100000 IJ SOLN
INTRAMUSCULAR | Status: AC
  Filled 2022-06-05: qty 1

## 2022-06-05 MED ORDER — SODIUM BICARBONATE 4.2 % IV SOLN
INTRAVENOUS | Status: AC
  Filled 2022-06-05: qty 10

## 2022-06-05 MED ORDER — HYDROCODONE-ACETAMINOPHEN 5-325 MG PO TABS: 1.0000 | ORAL_TABLET | Freq: Four times a day (QID) | ORAL | 0 refills | Status: DC | PRN

## 2022-06-05 MED ORDER — PROPOFOL 500 MG/50ML IV EMUL
INTRAVENOUS | Status: AC
  Filled 2022-06-05: qty 50

## 2022-06-05 MED ORDER — PHENYLEPHRINE HCL (PRESSORS) 10 MG/ML IV SOLN
INTRAVENOUS | Status: DC | PRN
  Administered 2022-06-05 (×2): 40 ug via INTRAVENOUS

## 2022-06-05 MED ORDER — LIDOCAINE HCL (CARDIAC) PF 100 MG/5ML IV SOSY
PREFILLED_SYRINGE | INTRAVENOUS | Status: DC | PRN
  Administered 2022-06-05: 20 mg via INTRAVENOUS

## 2022-06-05 MED ORDER — CEFAZOLIN SODIUM-DEXTROSE 2-4 GM/100ML-% IV SOLN
INTRAVENOUS | Status: AC
  Filled 2022-06-05: qty 100

## 2022-06-05 MED ORDER — PROPOFOL 10 MG/ML IV BOLUS
INTRAVENOUS | Status: DC | PRN
  Administered 2022-06-05 (×2): 10 mg via INTRAVENOUS
  Administered 2022-06-05: 20 mg via INTRAVENOUS

## 2022-06-05 MED ORDER — LACTATED RINGERS IV SOLN: INTRAVENOUS | Status: DC

## 2022-06-05 MED ORDER — LIDOCAINE 2% (20 MG/ML) 5 ML SYRINGE
INTRAMUSCULAR | Status: AC
  Filled 2022-06-05: qty 5

## 2022-06-05 MED ORDER — LIDOCAINE HCL (PF) 1 % IJ SOLN: INTRAMUSCULAR | Status: DC | PRN

## 2022-06-05 MED ORDER — BUPIVACAINE HCL (PF) 0.5 % IJ SOLN
INTRAMUSCULAR | Status: AC
Start: 2022-06-05 — End: ?
  Filled 2022-06-05: qty 30

## 2022-06-05 MED ORDER — ONDANSETRON HCL 4 MG/2ML IJ SOLN
INTRAMUSCULAR | Status: DC | PRN
  Administered 2022-06-05: 4 mg via INTRAVENOUS

## 2022-06-05 MED ORDER — ONDANSETRON HCL 4 MG/2ML IJ SOLN
INTRAMUSCULAR | Status: AC
  Filled 2022-06-05: qty 2

## 2022-06-05 MED ORDER — LIDOCAINE HCL (PF) 1 % IJ SOLN
INTRAMUSCULAR | Status: AC
  Filled 2022-06-05: qty 30

## 2022-06-05 MED ORDER — SODIUM BICARBONATE 4.2 % IV SOLN
INTRAVENOUS | Status: DC | PRN
  Administered 2022-06-05: 8 mL via SUBCUTANEOUS

## 2022-06-05 MED ORDER — ACETAMINOPHEN 10 MG/ML IV SOLN: 1000.0000 mg | Freq: Once | INTRAVENOUS | Status: DC | PRN

## 2022-06-05 MED ORDER — FENTANYL CITRATE (PF) 100 MCG/2ML IJ SOLN
INTRAMUSCULAR | Status: AC
  Filled 2022-06-05: qty 2

## 2022-06-05 MED ORDER — FENTANYL CITRATE (PF) 100 MCG/2ML IJ SOLN: 25.0000 ug | INTRAMUSCULAR | Status: DC | PRN

## 2022-06-05 MED ORDER — FENTANYL CITRATE (PF) 100 MCG/2ML IJ SOLN
INTRAMUSCULAR | Status: DC | PRN
  Administered 2022-06-05: 50 ug via INTRAVENOUS

## 2022-06-05 MED ORDER — PROPOFOL 500 MG/50ML IV EMUL
INTRAVENOUS | Status: DC | PRN
  Administered 2022-06-05: 75 ug/kg/min via INTRAVENOUS

## 2022-06-05 MED ORDER — CEFAZOLIN SODIUM-DEXTROSE 2-4 GM/100ML-% IV SOLN
2.0000 g | INTRAVENOUS | Status: AC
  Administered 2022-06-05: 2 g via INTRAVENOUS

## 2022-06-05 SURGICAL SUPPLY — 48 items
ADH SKN CLS APL DERMABOND .7 (GAUZE/BANDAGES/DRESSINGS)
APL SKNCLS STERI-STRIP NONHPOA (GAUZE/BANDAGES/DRESSINGS)
BENZOIN TINCTURE PRP APPL 2/3 (GAUZE/BANDAGES/DRESSINGS) IMPLANT
BLADE CLIPPER SURG (BLADE) IMPLANT
BLADE SURG 15 STRL LF DISP TIS (BLADE) ×1 IMPLANT
BLADE SURG 15 STRL SS (BLADE) ×1
CANISTER SUCT 1200ML W/VALVE (MISCELLANEOUS) IMPLANT
CLEANER CAUTERY TIP 5X5 PAD (MISCELLANEOUS) ×1 IMPLANT
COVER BACK TABLE 60X90IN (DRAPES) ×1 IMPLANT
COVER MAYO STAND STRL (DRAPES) ×1 IMPLANT
DERMABOND ADVANCED (GAUZE/BANDAGES/DRESSINGS)
DERMABOND ADVANCED .7 DNX12 (GAUZE/BANDAGES/DRESSINGS) IMPLANT
DRAPE LAPAROTOMY 100X72 PEDS (DRAPES) IMPLANT
DRAPE U-SHAPE 76X120 STRL (DRAPES) IMPLANT
ELECT REM PT RETURN 9FT ADLT (ELECTROSURGICAL) ×1
ELECTRODE REM PT RTRN 9FT ADLT (ELECTROSURGICAL) ×1 IMPLANT
GAUZE SPONGE 4X4 12PLY STRL LF (GAUZE/BANDAGES/DRESSINGS) ×1 IMPLANT
GLOVE BIO SURGEON STRL SZ8 (GLOVE) ×1 IMPLANT
GOWN STRL REUS W/ TWL LRG LVL3 (GOWN DISPOSABLE) ×1 IMPLANT
GOWN STRL REUS W/ TWL XL LVL3 (GOWN DISPOSABLE) ×1 IMPLANT
GOWN STRL REUS W/TWL LRG LVL3 (GOWN DISPOSABLE) ×1
GOWN STRL REUS W/TWL XL LVL3 (GOWN DISPOSABLE) ×1
NDL HYPO 25X1 1.5 SAFETY (NEEDLE) ×1 IMPLANT
NDL HYPO 27GX1-1/4 (NEEDLE) IMPLANT
NEEDLE HYPO 25X1 1.5 SAFETY (NEEDLE) ×1 IMPLANT
NEEDLE HYPO 27GX1-1/4 (NEEDLE) IMPLANT
NS IRRIG 1000ML POUR BTL (IV SOLUTION) IMPLANT
PACK BASIN DAY SURGERY FS (CUSTOM PROCEDURE TRAY) ×1 IMPLANT
PAD CLEANER CAUTERY TIP 5X5 (MISCELLANEOUS) ×1
PENCIL SMOKE EVACUATOR (MISCELLANEOUS) ×1 IMPLANT
SLEEVE SCD COMPRESS KNEE MED (STOCKING) ×1 IMPLANT
SPIKE FLUID TRANSFER (MISCELLANEOUS) ×1 IMPLANT
STRIP CLOSURE SKIN 1/2X4 (GAUZE/BANDAGES/DRESSINGS) IMPLANT
SUT ETHILON 3 0 FSL (SUTURE) IMPLANT
SUT ETHILON 5 0 PS 2 18 (SUTURE) IMPLANT
SUT MNCRL AB 4-0 PS2 18 (SUTURE) IMPLANT
SUT VIC AB 3-0 SH 27 (SUTURE)
SUT VIC AB 3-0 SH 27X BRD (SUTURE) IMPLANT
SUT VIC AB 4-0 SH 18 (SUTURE) ×1 IMPLANT
SUT VIC AB 5-0 PS2 18 (SUTURE) IMPLANT
SUT VICRYL 3-0 CR8 SH (SUTURE) IMPLANT
SYR BULB EAR ULCER 3OZ GRN STR (SYRINGE) ×1 IMPLANT
SYR CONTROL 10ML LL (SYRINGE) ×1 IMPLANT
TOWEL GREEN STERILE FF (TOWEL DISPOSABLE) ×1 IMPLANT
TRAY DSU PREP LF (CUSTOM PROCEDURE TRAY) ×1 IMPLANT
TUBE CONNECTING 20X1/4 (TUBING) IMPLANT
UNDERPAD 30X36 HEAVY ABSORB (UNDERPADS AND DIAPERS) IMPLANT
YANKAUER SUCT BULB TIP NO VENT (SUCTIONS) IMPLANT

## 2022-06-05 NOTE — Anesthesia Preprocedure Evaluation (Signed)
Anesthesia Evaluation  Patient identified by MRN, date of birth, ID band Patient awake    Reviewed: Allergy & Precautions, NPO status , Patient's Chart, lab work & pertinent test results  History of Anesthesia Complications (+) PONV and history of anesthetic complications  Airway Mallampati: II  TM Distance: >3 FB Neck ROM: Full    Dental no notable dental hx.    Pulmonary neg pulmonary ROS,    Pulmonary exam normal        Cardiovascular hypertension, Pt. on medications  Rhythm:Regular Rate:Normal     Neuro/Psych Depression negative neurological ROS     GI/Hepatic GERD  Medicated,  Endo/Other  diabetes, Type 2, Oral Hypoglycemic AgentsHypothyroidism   Renal/GU   negative genitourinary   Musculoskeletal  (+) Arthritis , Osteoarthritis,    Abdominal   Peds  Hematology negative hematology ROS (+)   Anesthesia Other Findings   Reproductive/Obstetrics                             Anesthesia Physical Anesthesia Plan  ASA: 2  Anesthesia Plan: MAC   Post-op Pain Management:    Induction: Intravenous  PONV Risk Score and Plan: 3 and Ondansetron, Dexamethasone, Propofol infusion and Treatment may vary due to age or medical condition  Airway Management Planned: Simple Face Mask, Natural Airway and Nasal Cannula  Additional Equipment: None  Intra-op Plan:   Post-operative Plan:   Informed Consent: I have reviewed the patients History and Physical, chart, labs and discussed the procedure including the risks, benefits and alternatives for the proposed anesthesia with the patient or authorized representative who has indicated his/her understanding and acceptance.     Dental advisory given  Plan Discussed with:   Anesthesia Plan Comments:         Anesthesia Quick Evaluation

## 2022-06-05 NOTE — Anesthesia Postprocedure Evaluation (Signed)
Anesthesia Post Note  Patient: Sonya Brady  Procedure(s) Performed: EXCISION MASS RIGHT SHOULDER (Right)     Patient location during evaluation: PACU Anesthesia Type: MAC Level of consciousness: awake and alert Pain management: pain level controlled Vital Signs Assessment: post-procedure vital signs reviewed and stable Respiratory status: spontaneous breathing, nonlabored ventilation, respiratory function stable and patient connected to nasal cannula oxygen Cardiovascular status: stable and blood pressure returned to baseline Postop Assessment: no apparent nausea or vomiting Anesthetic complications: no   No notable events documented.  Last Vitals:  Vitals:   06/05/22 1345 06/05/22 1400  BP: 139/74 (!) 140/76  Pulse: 64 68  Resp: 15 14  Temp:    SpO2: 96% 97%    Last Pain:  Vitals:   06/05/22 1415  TempSrc:   PainSc: 0-No pain                 Belenda Cruise P Tyann Niehaus

## 2022-06-05 NOTE — Discharge Instructions (Addendum)
You may shower tomorrow Expect to see some bruising around the site     Post Anesthesia Home Care Instructions  Activity: Get plenty of rest for the remainder of the day. A responsible individual must stay with you for 24 hours following the procedure.  For the next 24 hours, DO NOT: -Drive a car -Paediatric nurse -Drink alcoholic beverages -Take any medication unless instructed by your physician -Make any legal decisions or sign important papers.  Meals: Start with liquid foods such as gelatin or soup. Progress to regular foods as tolerated. Avoid greasy, spicy, heavy foods. If nausea and/or vomiting occur, drink only clear liquids until the nausea and/or vomiting subsides. Call your physician if vomiting continues.  Special Instructions/Symptoms: Your throat may feel dry or sore from the anesthesia or the breathing tube placed in your throat during surgery. If this causes discomfort, gargle with warm salt water. The discomfort should disappear within 24 hours.  If you had a scopolamine patch placed behind your ear for the management of post- operative nausea and/or vomiting:  1. The medication in the patch is effective for 72 hours, after which it should be removed.  Wrap patch in a tissue and discard in the trash. Wash hands thoroughly with soap and water. 2. You may remove the patch earlier than 72 hours if you experience unpleasant side effects which may include dry mouth, dizziness or visual disturbances. 3. Avoid touching the patch. Wash your hands with soap and water after contact with the patch.

## 2022-06-05 NOTE — Transfer of Care (Signed)
Immediate Anesthesia Transfer of Care Note  Patient: Sonya Brady  Procedure(s) Performed: EXCISION MASS RIGHT SHOULDER (Right)  Patient Location: PACU  Anesthesia Type:MAC  Level of Consciousness: awake, alert  and oriented  Airway & Oxygen Therapy: Patient Spontanous Breathing and Patient connected to face mask oxygen  Post-op Assessment: Report given to RN and Post -op Vital signs reviewed and stable  Post vital signs: Reviewed and stable  Last Vitals:  Vitals Value Taken Time  BP 118/68 06/05/22 1324  Temp    Pulse 70 06/05/22 1325  Resp 15 06/05/22 1325  SpO2 97 % 06/05/22 1325  Vitals shown include unvalidated device data.  Last Pain:  Vitals:   06/05/22 1031  TempSrc: Oral  PainSc: 0-No pain         Complications: No notable events documented.

## 2022-06-05 NOTE — Op Note (Signed)
Sonya Brady  Jul 26, 1951   06/05/2022    PCP:  Hoyt Koch, MD   Surgeon: Kaylyn Lim, MD, FACS  Asst:  None  Anes:  Local 1% lidocaine with sodium bicarb and MAC  Preop Dx: Mass right shoulder Postop Dx: Lipomatous mass top right shoulder  Procedure: Excision of mass Location Surgery: [facility] 5 Complications: None noted  EBL:   Minimal cc  Drains: None  Description of Procedure:  The patient was taken to OR 5.  After anesthesia was administered and the patient was prepped  with ChloraPrep and a timeout was performed.  A linear incision was made over this mass and carried down through the skin and the little subcutaneous tissue to a pseudocapsule.  This was incised revealing the mass which would extrude through the incision.  I cut this in enlargement incision little bit to allow enable me to work around the mass and then mobilize it from the skin on the right medial and lateral sides and then take it off from the tissue posteriorly.  This mass seem to come out in toto.  The resultant cavity was closed with 4-0 Vicryl deep and subcutaneously and there was some subcuticular 4-0 Monocryl and Dermabond.  The mass was sent for permanent sections  The patient tolerated the procedure well and was taken to the PACU in stable condition.     Matt B. Hassell Done, [place], [facility], [place]

## 2022-06-05 NOTE — Interval H&P Note (Signed)
History and Physical Interval Note:  06/05/2022 12:16 PM  Sonya Brady  has presented today for surgery, with the diagnosis of [name].  The various methods of treatment have been discussed with the patient and family. After consideration of risks, benefits and other options for treatment, the patient has consented to  Procedure(s): EXCISION MASS RIGHT SHOULDER (Right) as a surgical intervention.  The patient's history has been reviewed, patient examined, no change in status, stable for surgery.  I have reviewed the patient's chart and labs.  Questions were answered to the patient's satisfaction.     [name]

## 2022-06-06 ENCOUNTER — Encounter (HOSPITAL_BASED_OUTPATIENT_CLINIC_OR_DEPARTMENT_OTHER): Payer: Self-pay | Admitting: Surgery

## 2022-06-06 LAB — SURGICAL PATHOLOGY

## 2022-06-14 ENCOUNTER — Telehealth: Payer: Self-pay

## 2022-06-14 DIAGNOSIS — L709 Acne, unspecified: Secondary | ICD-10-CM | POA: Insufficient documentation

## 2022-06-14 DIAGNOSIS — N63 Unspecified lump in unspecified breast: Secondary | ICD-10-CM | POA: Insufficient documentation

## 2022-06-14 DIAGNOSIS — K649 Unspecified hemorrhoids: Secondary | ICD-10-CM | POA: Insufficient documentation

## 2022-06-14 DIAGNOSIS — D259 Leiomyoma of uterus, unspecified: Secondary | ICD-10-CM | POA: Insufficient documentation

## 2022-06-14 NOTE — Telephone Encounter (Signed)
Mammogram was done with Dr. Ulanda Edison with [facility] . Pt states that it was completed it May 2023.  Phone: [phone]

## 2022-06-16 NOTE — Telephone Encounter (Signed)
Request has been faxed to [facility].

## 2022-06-28 DIAGNOSIS — H6522 Chronic serous otitis media, left ear: Secondary | ICD-10-CM | POA: Diagnosis not present

## 2022-06-28 DIAGNOSIS — H7312 Chronic myringitis, left ear: Secondary | ICD-10-CM | POA: Diagnosis not present

## 2022-07-05 DIAGNOSIS — H43813 Vitreous degeneration, bilateral: Secondary | ICD-10-CM | POA: Diagnosis not present

## 2022-07-05 DIAGNOSIS — H52203 Unspecified astigmatism, bilateral: Secondary | ICD-10-CM | POA: Diagnosis not present

## 2022-07-05 DIAGNOSIS — E119 Type 2 diabetes mellitus without complications: Secondary | ICD-10-CM | POA: Diagnosis not present

## 2022-07-05 DIAGNOSIS — H04123 Dry eye syndrome of bilateral lacrimal glands: Secondary | ICD-10-CM | POA: Diagnosis not present

## 2022-07-05 DIAGNOSIS — H40023 Open angle with borderline findings, high risk, bilateral: Secondary | ICD-10-CM | POA: Diagnosis not present

## 2022-07-05 DIAGNOSIS — H524 Presbyopia: Secondary | ICD-10-CM | POA: Diagnosis not present

## 2022-07-05 LAB — HM DIABETES EYE EXAM

## 2022-07-10 ENCOUNTER — Encounter: Payer: Self-pay | Admitting: *Deleted

## 2022-07-12 ENCOUNTER — Other Ambulatory Visit: Payer: Self-pay

## 2022-07-12 ENCOUNTER — Ambulatory Visit (INDEPENDENT_AMBULATORY_CARE_PROVIDER_SITE_OTHER): Payer: Medicare HMO

## 2022-07-12 DIAGNOSIS — Z23 Encounter for immunization: Secondary | ICD-10-CM

## 2022-07-12 MED ORDER — ONETOUCH DELICA LANCETS 30G MISC: 0 refills | Status: DC

## 2022-07-12 NOTE — Progress Notes (Signed)
After obtaining consent, and per orders of Dr. Sharlet Salina, injection of High Dose Flu given in the left deltoid by Marrian Salvage. Patient tolerated well and instructed to report any adverse reaction to me immediately.

## 2022-07-17 ENCOUNTER — Other Ambulatory Visit: Payer: Self-pay

## 2022-07-17 DIAGNOSIS — E118 Type 2 diabetes mellitus with unspecified complications: Secondary | ICD-10-CM

## 2022-07-17 MED ORDER — ONETOUCH VERIO W/DEVICE KIT: PACK | 0 refills | Status: DC

## 2022-07-28 DIAGNOSIS — Z0100 Encounter for examination of eyes and vision without abnormal findings: Secondary | ICD-10-CM | POA: Diagnosis not present

## 2022-07-31 ENCOUNTER — Other Ambulatory Visit: Payer: Self-pay

## 2022-07-31 MED ORDER — ONETOUCH VERIO VI STRP: ORAL_STRIP | 1 refills | Status: DC

## 2022-08-21 ENCOUNTER — Other Ambulatory Visit: Payer: Self-pay | Admitting: Internal Medicine

## 2022-08-21 DIAGNOSIS — E118 Type 2 diabetes mellitus with unspecified complications: Secondary | ICD-10-CM

## 2022-08-25 ENCOUNTER — Other Ambulatory Visit: Payer: Self-pay | Admitting: Endocrinology

## 2022-09-19 ENCOUNTER — Telehealth: Payer: Self-pay | Admitting: Internal Medicine

## 2022-09-19 NOTE — Telephone Encounter (Signed)
Patient called states she thinks she is having a diverticulitis flare up and is seeking advise.

## 2022-09-19 NOTE — Telephone Encounter (Signed)
Pt states Saturday she started having pain in her LLQ and diarrhea. She also reports lots of gas. She has not had any fever. She thinks she is having a diverticulitis flare. Please advise.

## 2022-09-20 ENCOUNTER — Other Ambulatory Visit: Payer: Self-pay

## 2022-09-20 MED ORDER — AMOXICILLIN-POT CLAVULANATE 875-125 MG PO TABS: 1.0000 | ORAL_TABLET | Freq: Two times a day (BID) | ORAL | 0 refills | Status: DC

## 2022-09-20 NOTE — Telephone Encounter (Signed)
Spoke with pt and she is aware of Dr. Blanch Media recommendations. Script sent to pharmacy. Pt scheduled to see Nicoletta Ba PA 12/21 at 1:30pm. Pt aware.

## 2022-09-20 NOTE — Telephone Encounter (Signed)
1.  Residue diet 2.  Augmentin 875 mg p.o. twice daily x 7 days 3.  Extra strength Tylenol as needed for pain 4.  Make her a visit with one of the advanced practitioners sometime next week. 5.  Problem worsened significantly in the interim, go to the ER

## 2022-09-20 NOTE — Telephone Encounter (Signed)
Patient is calling awaiting a response. Please advise

## 2022-09-28 ENCOUNTER — Ambulatory Visit: Payer: Medicare HMO | Admitting: Physician Assistant

## 2022-09-28 ENCOUNTER — Encounter: Payer: Self-pay | Admitting: Physician Assistant

## 2022-09-28 VITALS — BP 136/70 | HR 71 | Ht 66.0 in | Wt 169.0 lb

## 2022-09-28 DIAGNOSIS — K5792 Diverticulitis of intestine, part unspecified, without perforation or abscess without bleeding: Secondary | ICD-10-CM | POA: Diagnosis not present

## 2022-09-28 DIAGNOSIS — R194 Change in bowel habit: Secondary | ICD-10-CM | POA: Diagnosis not present

## 2022-09-28 NOTE — Progress Notes (Signed)
Subjective:    Patient ID: Sonya Brady, female    DOB: 1950-12-29, 71 y.o.   MRN: [id number]  HPI Sonya Brady is a pleasant 71 year old female, established with Dr. Henrene Pastor who comes in today for follow-up after recent episode of diverticulitis.  She had called on 09/19/2022 concerned that she was having an episode of diverticulitis with left lower quadrant abdominal pain and was started on a course of Augmentin 875 twice daily x 7 days. She has 1 day remaining on antibiotics.  She says she had traveled to [place] last week and while there did have problems with constipation and was unable to have a bowel movement for couple of days, had some increase in gas and bloating.  Since returning home she has been able to have bowel movements, did have a couple of loose to liquid stools yesterday which she thought were darker but today had a normal bowel movement.  She feels she has had an increase in gas while being on the antibiotics.  Her abdominal pain has improved but has not completely resolved.  She is asking whether the symptoms may be secondary to IBS as she was told she probably had this a long time ago.  She also relates that her gynecologist recently told her that she has a rectocele and cystocele by patient report. She is also being treated for GERD and says that her symptoms are under good control on omeprazole 20 mg daily p.o. every morning.  No dysphagia or odynophagia.  She last had colonoscopy in April 2018 with finding of 2 diminutive polyps 1 to 2 mm in size and multiple diverticuli in the right and left colon.  Biopsies showed 1 hyperplastic polyp and 1 benign colonic tissue, follow-up in 10 years was recommended. EGD in April 2018 with a distal esophageal stricture dilated to 55 [place] otherwise negative exam She had CT of the abdomen pelvis in December 2021 showing descending and sigmoid diverticulosis, no evidence of acute diverticulitis, no other acute findings in the abdomen or pelvis,  she is status post hysterectomy and had evidence of cholelithiasis.  Review of Systems Pertinent positive and negative review of systems were noted in the above HPI section.  All other review of systems was otherwise negative.   Outpatient Encounter Medications as of 09/28/2022  Medication Sig   amoxicillin-clavulanate (AUGMENTIN) 875-125 MG tablet Take 1 tablet by mouth 2 (two) times daily.   atorvastatin (LIPITOR) 80 MG tablet Take 1 tablet (80 mg total) by mouth daily.   Blood Glucose Monitoring Suppl (ONETOUCH VERIO) w/Device KIT Use As Directed   glucose blood (ONETOUCH VERIO) test strip USE 1 STRIP TO CHECK GLUCOSE ONCE DAILY   latanoprost (XALATAN) 0.005 % ophthalmic solution Place 1 drop into both eyes at bedtime.   levothyroxine (SYNTHROID) 112 MCG tablet Take 1 tablet (112 mcg total) by mouth daily before breakfast.   lisinopril-hydrochlorothiazide (ZESTORETIC) 20-25 MG tablet Take 1 tablet by mouth daily.   Melatonin 10 MG TABS Take by mouth at bedtime as needed.   metFORMIN (GLUCOPHAGE-XR) 500 MG 24 hr tablet Take 3 tablets (1,500 mg total) by mouth daily with breakfast.   montelukast (SINGULAIR) 10 MG tablet Take 10 mg by mouth at bedtime.   Multiple Vitamins-Minerals (MULTIVITAMIN PO) Take by mouth. 2 chew tabs daily   omeprazole (PRILOSEC) 20 MG capsule Take 1 capsule (20 mg total) by mouth daily.   OneTouch Delica Lancets 03U MISC USE 1  TO CHECK GLUCOSE ONCE DAILY  timolol (BETIMOL) 0.5 % ophthalmic solution 1 drop 2 (two) times daily.   UNABLE TO FIND Clindomycin ear drop 4 drops 3 x week to left ear on Monday Wednesday and friday   HYDROcodone-acetaminophen (NORCO/VICODIN) 5-325 MG tablet Take 1 tablet by mouth every 6 (six) hours as needed for moderate pain. (Patient not taking: Reported on 09/28/2022)   No facility-administered encounter medications on file as of 09/28/2022.   Allergies  Allergen Reactions   Flagyl [Metronidazole]     Redness on arms   No Known  Allergies    Patient Active Problem List   Diagnosis Date Noted   Acne 06/14/2022   Breast lump 06/14/2022   Hemorrhoids 06/14/2022   Uterine leiomyoma 06/14/2022   Lipoma of right shoulder 02/07/2022   Acute postoperative pain 11/01/2021   Pain of toe of left foot 09/09/2021   Bilateral carpal tunnel syndrome 08/24/2021   Goiter 12/24/2017   Controlled type 2 diabetes mellitus ([place]) 10/19/2017   Chronic myringitis, left ear 07/12/2015   Routine health maintenance 07/24/2012   Hyperlipidemia associated with type 2 diabetes mellitus ([place]) 07/23/2012   GERD 09/05/2010   Hypothyroidism 09/04/2007   Essential hypertension 09/04/2007   Social History   Socioeconomic History   Marital status: Single    Spouse name: Not on file   Number of children: 2   Years of education: Not on file   Highest education level: Not on file  Occupational History   Occupation: bookkeeper  Tobacco Use   Smoking status: Never   Smokeless tobacco: Never  Vaping Use   Vaping Use: Never used  Substance and Sexual Activity   Alcohol use: Yes    Comment: social   Drug use: No   Sexual activity: Not on file  Other Topics Concern   Not on file  Social History Narrative   Married '72-widowed '97   HSG   1 daughter '81, 1 son '83: son going thru divorce ['09]; applying to medical school   Work: bookkeeper [employer]   Social Determinants of Health   Financial Resource Strain: Low Risk  (01/31/2022)   Overall Financial Resource Strain (CARDIA)    Difficulty of Paying Living Expenses: Not hard at all  Food Insecurity: No Food Insecurity (01/31/2022)   Hunger Vital Sign    Worried About Running Out of Food in the Last Year: Never true    Ran Out of Food in the Last Year: Never true  Transportation Needs: No Transportation Needs (01/31/2022)   PRAPARE - Hydrologist (Medical): No    Lack of Transportation (Non-Medical): No  Physical Activity: Sufficiently Active  (01/31/2022)   Exercise Vital Sign    Days of Exercise per Week: 5 days    Minutes of Exercise per Session: 30 min  Stress: No Stress Concern Present (01/31/2022)   [place]    Feeling of Stress : Not at all  Social Connections: Moderately Integrated (01/31/2022)   Social Connection and Isolation Panel [NHANES]    Frequency of Communication with Friends and Family: More than three times a week    Frequency of Social Gatherings with Friends and Family: More than three times a week    Attends Religious Services: More than 4 times per year    Active Member of [employer] or Organizations: Yes    Attends Archivist Meetings: More than 4 times per year    Marital Status: Widowed  Human resources officer  Violence: Not At Risk (01/31/2022)   Humiliation, Afraid, Rape, and Kick questionnaire    Fear of Current or Ex-Partner: No    Emotionally Abused: No    Physically Abused: No    Sexually Abused: No    Ms. Volkert family history includes Hypertension in her mother; Other (age of onset: 8) in her father; Other (age of onset: 51) in her mother; Thyroid disease in her brother and sister.      Objective:    Vitals:   09/28/22 1319  BP: 136/70  Pulse: 71    Physical Exam. Well-developed well-nourished older WF  in no acute distress.  Height, Weight, 169 BMI 27.8  HEENT; nontraumatic normocephalic, EOMI, PE R LA, sclera anicteric. Oropharynx; not examined today Neck; supple, no JVD Cardiovascular; regular rate and rhythm with S1-S2, no murmur rub or gallop Pulmonary; Clear bilaterally Abdomen; soft, there is some very mild tenderness in the left lower quadrant, no guarding or rebound ,nondistended, no palpable mass or hepatosplenomegaly, bowel sounds are active Rectal; not done today Skin; benign exam, no jaundice rash or appreciable lesions Extremities; no clubbing cyanosis or edema skin warm and dry Neuro/Psych; alert  and oriented x4, grossly nonfocal mood and affect appropriate        Assessment & Plan:   #91 71 year old female with prior history of diverticulitis, currently completing a course of Augmentin for presumed diverticulitis after calling on 09/19/2022 with left lower abdominal pain. Her abdominal pain has almost completely resolved on Augmentin but she has noticed increase in constipation last week and now having alternating bowel habits with loose to liquid stools yesterday and normal bowel movement today.  She has also noticed increase in gas. She may have some underlying IBS symptoms but I do think her presentation at this time was consistent with mild acute diverticulitis which is resolving. Likely has had alteration in bowel habits from the diverticulitis and antibiotics.  #2 colon cancer screening-up-to-date with last colonoscopy April 2018 1 diminutive hyperplastic polyp and indicated for 10-year interval follow-up 3.  Chronic GERD under good control on low-dose omeprazole 20 mg daily, no dysphagia.  She does have history of prior esophageal dilation 2018. 4.  Hypertension 5.  Hyperlipidemia 6.  Hypothyroidism 7.  Adult onset diabetes mellitus  Plan; complete the current course of Augmentin, she has about a day and a half left of current treatment. We discussed a low gas diet and she was given a copy of this to review and perhaps consider eliminating some of the higher gas producing foods that she is eating on a regular basis. Can try Gas-X 1-2 with lunch and dinner. Start Benefiber 1 dose daily in a glass of water. Continue omeprazole 20 mg p.o. every morning I have asked her to call back and speak to my nurse in a week if she has not had complete resolution of symptoms, and improvement in the altered bowel habits.  Choua Ikner Genia Harold PA-C 09/28/2022   Cc: Hoyt Koch, *

## 2022-09-28 NOTE — Patient Instructions (Signed)
_______________________________________________________  If you are age 72 or older, your body mass index should be between 23-30. Your Body mass index is 27.28 kg/m. If this is out of the aforementioned range listed, please consider follow up with your Primary Care Provider.  If you are age 73 or younger, your body mass index should be between 19-25. Your Body mass index is 27.28 kg/m. If this is out of the aformentioned range listed, please consider follow up with your Primary Care Provider.   Please follow Gas and flactulence prevention diet.  Try gas-x 1-2 at lunch and dinner.  Call back in 7-10 days if you are not feeling better.  The [facility] providers would like to encourage you to use [facility] to communicate with providers for non-urgent requests or questions.  Due to long hold times on the telephone, sending your provider a message by [facility] may be a faster and more efficient way to get a response.  Please allow 48 business hours for a response.  Please remember that this is for non-urgent requests.   It was a pleasure to see you today!  Thank you for trusting me with your gastrointestinal care!

## 2022-09-29 NOTE — Progress Notes (Signed)
Assessment and plans noted ?

## 2022-10-06 ENCOUNTER — Telehealth: Payer: Self-pay

## 2022-10-06 MED ORDER — AMOXICILLIN-POT CLAVULANATE 875-125 MG PO TABS: 1.0000 | ORAL_TABLET | Freq: Two times a day (BID) | ORAL | 0 refills | Status: AC

## 2022-10-06 MED ORDER — DICYCLOMINE HCL 10 MG PO CAPS: ORAL_CAPSULE | ORAL | 1 refills | Status: DC

## 2022-10-06 NOTE — Telephone Encounter (Signed)
I was told to send the calls to you this afternoon since the office is closed.  Is it ok for this to wait?

## 2022-10-06 NOTE — Telephone Encounter (Signed)
1.  Have her take another 5 days of Augmentin 875 mg p.o. twice daily.  Please prescribe 2.  Low residue diet 3.  Please prescribe Bentyl 20 mg; #30; take 1 p.o. every 4 6 hours as needed for severe pain 4.  Tylenol 5.  Have her call the office on Tuesday Wednesday with an update  Thanks, Dr.  Henrene Pastor

## 2022-10-06 NOTE — Telephone Encounter (Signed)
The pt has a recent history of diverticulitis. She finished a 10 day course augmentin BID on 12/21. Pt states she has mid to left lower abd pain with "mushy stools"  that tylenol and ibuprofen is not helping that started at 4 am with 4-5 mushy stools.. Of note she has an allergy to flagyl (rash over arms).

## 2022-10-06 NOTE — Telephone Encounter (Signed)
The pt has been advised. The augmentin and bentyl script has been sent. She will follow a low residue diet and call back in Tue to update.  She will also take tylenol as needed.

## 2022-11-08 DIAGNOSIS — H7312 Chronic myringitis, left ear: Secondary | ICD-10-CM | POA: Diagnosis not present

## 2022-11-08 DIAGNOSIS — J039 Acute tonsillitis, unspecified: Secondary | ICD-10-CM | POA: Diagnosis not present

## 2022-11-08 DIAGNOSIS — Z9622 Myringotomy tube(s) status: Secondary | ICD-10-CM | POA: Diagnosis not present

## 2022-11-20 DIAGNOSIS — G5602 Carpal tunnel syndrome, left upper limb: Secondary | ICD-10-CM | POA: Diagnosis not present

## 2022-11-20 DIAGNOSIS — M1812 Unilateral primary osteoarthritis of first carpometacarpal joint, left hand: Secondary | ICD-10-CM | POA: Diagnosis not present

## 2022-12-11 ENCOUNTER — Encounter: Payer: Self-pay | Admitting: Internal Medicine

## 2022-12-19 ENCOUNTER — Other Ambulatory Visit: Payer: Self-pay | Admitting: Endocrinology

## 2022-12-27 DIAGNOSIS — H7312 Chronic myringitis, left ear: Secondary | ICD-10-CM | POA: Diagnosis not present

## 2022-12-27 DIAGNOSIS — Z9622 Myringotomy tube(s) status: Secondary | ICD-10-CM | POA: Diagnosis not present

## 2023-01-11 ENCOUNTER — Other Ambulatory Visit (HOSPITAL_COMMUNITY): Payer: Self-pay

## 2023-01-11 ENCOUNTER — Telehealth: Payer: Self-pay | Admitting: Pharmacy Technician

## 2023-01-11 NOTE — Telephone Encounter (Signed)
Patient Advocate Encounter  Prior Authorization for DICYCLOMINE 10MG  has been approved by [facility].    PA# [id number] Effective dates: 1.1.24 through 12.31.24   Received notification from [facility] that prior authorization for DICYCLOMINE 10MG  is required.   PA submitted on 4.4.24 Key [id number] Status is pending

## 2023-01-12 DIAGNOSIS — H40023 Open angle with borderline findings, high risk, bilateral: Secondary | ICD-10-CM | POA: Diagnosis not present

## 2023-01-12 DIAGNOSIS — H02403 Unspecified ptosis of bilateral eyelids: Secondary | ICD-10-CM | POA: Diagnosis not present

## 2023-01-12 LAB — HM DIABETES EYE EXAM

## 2023-01-19 ENCOUNTER — Other Ambulatory Visit: Payer: Self-pay | Admitting: Internal Medicine

## 2023-01-23 ENCOUNTER — Telehealth: Payer: Self-pay

## 2023-01-23 NOTE — Telephone Encounter (Signed)
Contacted Sonya Brady to schedule their annual wellness visit. Patient declined to schedule AWV at this time.  Patient states she is driving and will call back later to schedule.  [name], CMA (AAMA)  [facility]- AWV Program

## 2023-02-13 ENCOUNTER — Other Ambulatory Visit: Payer: Self-pay | Admitting: Internal Medicine

## 2023-02-15 ENCOUNTER — Other Ambulatory Visit: Payer: Self-pay | Admitting: Internal Medicine

## 2023-02-16 ENCOUNTER — Encounter: Payer: Self-pay | Admitting: Internal Medicine

## 2023-02-16 ENCOUNTER — Ambulatory Visit (INDEPENDENT_AMBULATORY_CARE_PROVIDER_SITE_OTHER): Payer: Medicare HMO | Admitting: Internal Medicine

## 2023-02-16 VITALS — BP 130/80 | HR 81 | Temp 98.3°F | Ht 66.0 in | Wt 165.0 lb

## 2023-02-16 DIAGNOSIS — K219 Gastro-esophageal reflux disease without esophagitis: Secondary | ICD-10-CM

## 2023-02-16 DIAGNOSIS — E118 Type 2 diabetes mellitus with unspecified complications: Secondary | ICD-10-CM

## 2023-02-16 DIAGNOSIS — E039 Hypothyroidism, unspecified: Secondary | ICD-10-CM | POA: Diagnosis not present

## 2023-02-16 DIAGNOSIS — I10 Essential (primary) hypertension: Secondary | ICD-10-CM

## 2023-02-16 DIAGNOSIS — E785 Hyperlipidemia, unspecified: Secondary | ICD-10-CM

## 2023-02-16 DIAGNOSIS — Z0000 Encounter for general adult medical examination without abnormal findings: Secondary | ICD-10-CM | POA: Diagnosis not present

## 2023-02-16 DIAGNOSIS — E1169 Type 2 diabetes mellitus with other specified complication: Secondary | ICD-10-CM | POA: Diagnosis not present

## 2023-02-16 LAB — CBC
HCT: 39.8 % (ref 36.0–46.0)
Hemoglobin: 13.5 g/dL (ref 12.0–15.0)
MCHC: 34 g/dL (ref 30.0–36.0)
MCV: 88.3 fl (ref 78.0–100.0)
Platelets: 237 10*3/uL (ref 150.0–400.0)
RBC: 4.51 Mil/uL (ref 3.87–5.11)
RDW: 13.2 % (ref 11.5–15.5)
WBC: 4.4 10*3/uL (ref 4.0–10.5)

## 2023-02-16 LAB — COMPREHENSIVE METABOLIC PANEL
ALT: 12 U/L (ref 0–35)
AST: 13 U/L (ref 0–37)
Albumin: 4.4 g/dL (ref 3.5–5.2)
Alkaline Phosphatase: 86 U/L (ref 39–117)
BUN: 15 mg/dL (ref 6–23)
CO2: 27 mEq/L (ref 19–32)
Calcium: 9.7 mg/dL (ref 8.4–10.5)
Chloride: 101 mEq/L (ref 96–112)
Creatinine, Ser: 0.69 mg/dL (ref 0.40–1.20)
GFR: 86.14 mL/min (ref >60.00)
Glucose, Bld: 118 mg/dL — ABNORMAL HIGH (ref 70–99)
Potassium: 3.8 mEq/L (ref 3.5–5.1)
Sodium: 138 mEq/L (ref 135–145)
Total Bilirubin: 0.7 mg/dL (ref 0.2–1.2)
Total Protein: 7.6 g/dL (ref 6.0–8.3)

## 2023-02-16 LAB — VITAMIN B12: Vitamin B-12: 269 pg/mL (ref 211–911)

## 2023-02-16 LAB — MICROALBUMIN / CREATININE URINE RATIO
Creatinine,U: 88.3 mg/dL
Microalb Creat Ratio: 0.8 mg/g (ref 0.0–30.0)
Microalb, Ur: <0.7 mg/dL (ref 0.0–1.9)

## 2023-02-16 LAB — LIPID PANEL
Cholesterol: 147 mg/dL (ref 0–200)
HDL: 43.5 mg/dL (ref >39.00)
LDL Cholesterol: 84 mg/dL (ref 0–99)
NonHDL: 103.25
Total CHOL/HDL Ratio: 3
Triglycerides: 97 mg/dL (ref 0.0–149.0)
VLDL: 19.4 mg/dL (ref 0.0–40.0)

## 2023-02-16 LAB — HEMOGLOBIN A1C: Hgb A1c MFr Bld: 6.2 % (ref 4.6–6.5)

## 2023-02-16 LAB — TSH: TSH: 1.23 u[IU]/mL (ref 0.35–5.50)

## 2023-02-16 LAB — VITAMIN D 25 HYDROXY (VIT D DEFICIENCY, FRACTURES): VITD: 26.33 ng/mL — ABNORMAL LOW (ref 30.00–100.00)

## 2023-02-16 NOTE — Assessment & Plan Note (Signed)
Checking lipid panel and adjust lipitor 80 mg daily as needed. LDL goal <100.

## 2023-02-16 NOTE — Patient Instructions (Signed)
We will check the labs today. 

## 2023-02-16 NOTE — Progress Notes (Signed)
Subjective:   Patient ID: Sonya Brady, female    DOB: 01-30-1951, 72 y.o.   MRN: [id number]  HPI Here for medicare wellness, no new complaints. Please see A/P for status and treatment of chronic medical problems.   Diet: DM since diabetic Physical activity: sedentary Depression/mood screen: negative Hearing: intact to whispered voice Visual acuity: grossly normal, performs annual eye exam  ADLs: capable Fall risk: none Home safety: good Cognitive evaluation: intact to orientation, naming, recall and repetition EOL planning: adv directives discussed, in place  [employer] Visit from 02/16/2023 in [facility] [place]  PHQ-2 Total Score 0       Flowsheet Row Office Visit from 02/16/2023 in [facility] [place]  PHQ-9 Total Score 0         01/13/2021   10:49 AM 01/31/2022   10:07 AM 02/07/2022    2:46 PM 06/05/2022   10:33 AM 02/16/2023    8:43 AM  Fall Risk  Falls in the past year? 0 0 0  0  Was there an injury with Fall? 0 0 0  0  Fall Risk Category Calculator 0 0 0  0  Fall Risk Category (Retired) Low Low Low    (RETIRED) Patient Fall Risk Level  Low fall risk Low fall risk Moderate fall risk   Patient at Risk for Falls Due to  No Fall Risks     Fall risk Follow up  Falls evaluation completed   Falls evaluation completed    I have personally reviewed and have noted 1. The patient's medical and social history - reviewed today no changes 2. Their use of alcohol, tobacco or illicit drugs 3. Their current medications and supplements 4. The patient's functional ability including ADL's, fall risks, home safety risks and hearing or visual impairment. 5. Diet and physical activities 6. Evidence for depression or mood disorders 7. Care team reviewed and updated 8.  The patient is not on an opioid pain medication  Patient Care Team: Myrlene Broker, MD as PCP - General (Internal Medicine) Mardella Layman,  MD (Gastroenterology) Past Medical History:  Diagnosis Date   Acne    Allergy    Arthritis    Carpal tunnel syndrome of left wrist 10/21/2021   Complication of anesthesia    could hear sounds with r breast biopsy yrs ago,   Depression    situational when husband was sick and dying.   Diabetes mellitus Type 2    Diverticulosis    GERD (gastroesophageal reflux disease)    Goiter    benign   right 5.7 cm/3.0x1.8cm left 5.2 x 2.4 x 2.1 cm per 05-09-2019 [place] epic no further follow up needed   H. pylori infection    History of COVID-19 2020   sick x 1 week all symptoms resolved   Hypercholesteremia    Hypertension    Hypothyroidism    Insomnia    PONV (postoperative nausea and vomiting)    x 1 yrs ago per pt on 10-24-2021   Tinnitus, left ear 10/24/2021   for last 30 yrs   Unspecified Eustachian tube disorder, left ear    Past Surgical History:  Procedure Laterality Date   ABDOMINAL HYSTERECTOMY     both ovary intact age 80   BREAST LUMPECTOMY Bilateral    benign mmore than 35 yrs ago   CARPAL TUNNEL RELEASE Left 10/27/2021   Procedure: CARPAL TUNNEL RELEASE;  Surgeon: Gomez Cleverly, MD;  Location:  SURGERY CENTER;  Service: Orthopedics;  Laterality: Left;   carpel tunnel surgery Right    right 30 yrs ago per pt on 10-24-2021   CARPOMETACARPEL SUSPENSION PLASTY Left 10/27/2021   Procedure: left thumb carpometacarpal joint arthroplasty with tendon transfer;  Surgeon: Gomez Cleverly, MD;  Location: [facility];  Service: Orthopedics;  Laterality: Left;  with MAC   EXCISION MASS UPPER EXTREMETIES Right 06/05/2022   Procedure: EXCISION MASS RIGHT SHOULDER;  Surgeon: Luretha Murphy, MD;  Location: [facility];  Service: General;  Laterality: Right;   EYE SURGERY     cataracts yrs ago per pt on 10-24-2021   left ear eardrum reconstruction     ponv after 40 yrs ago   right lower abdominal mass benign removed  1978   TYMPANOPLASTY     tube left ear  multiple times ober last 44 years   UPPER GI ENDOSCOPY     2013-03-24 and Mar 24, 2017   Family History  Problem Relation Age of Onset   Other Father 63       deceased, unknown causes in 2023-03-25   Hypertension Mother    Other Mother 72       SBO, deceased   Thyroid disease Brother    Thyroid disease Sister    Breast cancer Neg Hx    Colon cancer Neg Hx    Esophageal cancer Neg Hx    Rectal cancer Neg Hx    Stomach cancer Neg Hx    Diabetes Neg Hx    Review of Systems  Constitutional: Negative.   HENT: Negative.    Eyes: Negative.   Respiratory:  Negative for cough, chest tightness and shortness of breath.   Cardiovascular:  Negative for chest pain, palpitations and leg swelling.  Gastrointestinal:  Negative for abdominal distention, abdominal pain, constipation, diarrhea, nausea and vomiting.  Musculoskeletal: Negative.   Skin: Negative.   Neurological: Negative.   Psychiatric/Behavioral: Negative.      Objective:  Physical Exam Constitutional:      Appearance: She is well-developed.  HENT:     Head: Normocephalic and atraumatic.  Cardiovascular:     Rate and Rhythm: Normal rate and regular rhythm.  Pulmonary:     Effort: Pulmonary effort is normal. No respiratory distress.     Breath sounds: Normal breath sounds. No wheezing or rales.  Abdominal:     General: Bowel sounds are normal. There is no distension.     Palpations: Abdomen is soft.     Tenderness: There is no abdominal tenderness. There is no rebound.  Musculoskeletal:     Cervical back: Normal range of motion.  Skin:    General: Skin is warm and dry.  Neurological:     Mental Status: She is alert and oriented to person, place, and time.     Coordination: Coordination normal.     Vitals:   02/16/23 0841  BP: 130/80  Pulse: 81  Temp: 98.3 F (36.8 C)  TempSrc: Oral  SpO2: 98%  Weight: 165 lb (74.8 kg)  Height: 5\' 6"  (1.676 m)    Assessment & Plan:

## 2023-02-16 NOTE — Assessment & Plan Note (Signed)
Flu shot yearly. Pneumonia complete. Shingrix complete. Tetanus due 2032. Colonoscopy due 2028. Mammogram due 2025, pap smear aged out and dexa complete. Counseled about sun safety and mole surveillance. Counseled about the dangers of distracted driving. Given 10 year screening recommendations.

## 2023-02-16 NOTE — Assessment & Plan Note (Signed)
Checking TSH and adjust synthroid 112 mcg daily as needed. Some increased fatigue which may be related.

## 2023-02-16 NOTE — Assessment & Plan Note (Signed)
Taking omeprazole 20 mg daily and well controlled. Will continue.

## 2023-02-16 NOTE — Assessment & Plan Note (Signed)
BP at goal on lisinopril/hctz. Checking CMP and adjust as needed.  

## 2023-02-16 NOTE — Assessment & Plan Note (Signed)
Foot exam done, checking microalbumin to creatinine ratio and lipid panel and cmp and HgA1c. Adjust as needed metformin 1500 mg daily. Is on ACE-I and statin. Reminded about eye exam.

## 2023-02-19 ENCOUNTER — Other Ambulatory Visit: Payer: Self-pay | Admitting: Internal Medicine

## 2023-02-28 ENCOUNTER — Other Ambulatory Visit: Payer: Self-pay | Admitting: Internal Medicine

## 2023-03-02 ENCOUNTER — Other Ambulatory Visit: Payer: Self-pay | Admitting: Internal Medicine

## 2023-03-02 MED ORDER — LISINOPRIL-HYDROCHLOROTHIAZIDE 20-25 MG PO TABS: 1.0000 | ORAL_TABLET | Freq: Every day | ORAL | 3 refills | Status: DC

## 2023-03-02 NOTE — Addendum Note (Signed)
Addended by: Levonne Lapping on: 03/02/2023 03:28 PM   Modules accepted: Orders

## 2023-03-10 ENCOUNTER — Other Ambulatory Visit: Payer: Self-pay | Admitting: Internal Medicine

## 2023-03-12 ENCOUNTER — Other Ambulatory Visit: Payer: Self-pay | Admitting: Internal Medicine

## 2023-03-30 DIAGNOSIS — N816 Rectocele: Secondary | ICD-10-CM | POA: Diagnosis not present

## 2023-03-30 DIAGNOSIS — Z01419 Encounter for gynecological examination (general) (routine) without abnormal findings: Secondary | ICD-10-CM | POA: Diagnosis not present

## 2023-03-30 DIAGNOSIS — Z1231 Encounter for screening mammogram for malignant neoplasm of breast: Secondary | ICD-10-CM | POA: Diagnosis not present

## 2023-03-30 DIAGNOSIS — N8111 Cystocele, midline: Secondary | ICD-10-CM | POA: Diagnosis not present

## 2023-04-02 ENCOUNTER — Ambulatory Visit
Admission: RE | Admit: 2023-04-02 | Discharge: 2023-04-02 | Disposition: A | Discharge status: Home / Self Care | Payer: Medicare HMO | Source: Ambulatory Visit | Attending: Emergency Medicine | Admitting: Emergency Medicine

## 2023-04-02 VITALS — BP 157/85 | HR 88 | Temp 99.0°F | Resp 17

## 2023-04-02 DIAGNOSIS — U071 COVID-19: Secondary | ICD-10-CM | POA: Diagnosis not present

## 2023-04-02 DIAGNOSIS — J069 Acute upper respiratory infection, unspecified: Secondary | ICD-10-CM | POA: Diagnosis not present

## 2023-04-02 NOTE — ED Provider Notes (Signed)
[facility]    CSN: [id number] Arrival date & time: 04/02/23  1402      History   Chief Complaint Chief Complaint  Patient presents with   Sore Throat    Cough, nasal ,stuffy ear - Entered by patient   Appointment    HPI Sonya Brady is a 72 y.o. female.  Patient was out of town in [place] this past weekend on 03/31/2023 when she began feeling ill.  She complains of nasal congestion, sore throat, bilateral ear aches, dry cough X 4 days. C/o loss of taste and smell.  She also reports feeling very tired.  She has had COVID in the past, and this feels different than when she had COVID before.  Has not tested herself for COVID at home.  Denies shortness of breath.  She has been taking NyQuil and DayQuil for her symptoms for some temporary relief.    Sore Throat    Past Medical History:  Diagnosis Date   Acne    Allergy    Arthritis    Carpal tunnel syndrome of left wrist 10/21/2021   Complication of anesthesia    could hear sounds with r breast biopsy yrs ago,   Depression    situational when husband was sick and dying.   Diabetes mellitus Type 2    Diverticulosis    GERD (gastroesophageal reflux disease)    Goiter    benign   right 5.7 cm/3.0x1.8cm left 5.2 x 2.4 x 2.1 cm per 05-09-2019 [place] epic no further follow up needed   H. pylori infection    History of COVID-19 2020   sick x 1 week all symptoms resolved   Hypercholesteremia    Hypertension    Hypothyroidism    Insomnia    PONV (postoperative nausea and vomiting)    x 1 yrs ago per pt on 10-24-2021   Tinnitus, left ear 10/24/2021   for last 30 yrs   Unspecified Eustachian tube disorder, left ear     Patient Active Problem List   Diagnosis Date Noted   Breast lump 06/14/2022   Hemorrhoids 06/14/2022   Uterine leiomyoma 06/14/2022   Bilateral carpal tunnel syndrome 08/24/2021   Controlled type 2 diabetes mellitus (HCC) 10/19/2017   Chronic myringitis, left ear 07/12/2015   Routine health  maintenance 07/24/2012   Hyperlipidemia associated with type 2 diabetes mellitus (HCC) 07/23/2012   GERD 09/05/2010   Hypothyroidism 09/04/2007   Essential hypertension 09/04/2007    Past Surgical History:  Procedure Laterality Date   ABDOMINAL HYSTERECTOMY     both ovary intact age 77   BREAST LUMPECTOMY Bilateral    benign mmore than 35 yrs ago   CARPAL TUNNEL RELEASE Left 10/27/2021   Procedure: CARPAL TUNNEL RELEASE;  Surgeon: Gomez Cleverly, MD;  Location: [facility];  Service: Orthopedics;  Laterality: Left;   carpel tunnel surgery Right    right 30 yrs ago per pt on 10-24-2021   CARPOMETACARPEL SUSPENSION PLASTY Left 10/27/2021   Procedure: left thumb carpometacarpal joint arthroplasty with tendon transfer;  Surgeon: Gomez Cleverly, MD;  Location: [facility];  Service: Orthopedics;  Laterality: Left;  with MAC   EXCISION MASS UPPER EXTREMETIES Right 06/05/2022   Procedure: EXCISION MASS RIGHT SHOULDER;  Surgeon: Luretha Murphy, MD;  Location: [facility];  Service: General;  Laterality: Right;   EYE SURGERY     cataracts yrs ago per pt on 10-24-2021   left ear eardrum reconstruction  ponv after 40 yrs ago   right lower abdominal mass benign removed  1978   TYMPANOPLASTY     tube left ear multiple times ober last 44 years   UPPER GI ENDOSCOPY     2013/05/13 and 2017-05-13    OB History   No obstetric history on file.      Home Medications    Prior to Admission medications   Medication Sig Start Date End Date Taking? Authorizing Provider  atorvastatin (LIPITOR) 80 MG tablet Take 1 tablet by mouth once daily 02/19/23   Myrlene Broker, MD  Blood Glucose Monitoring Suppl [facility] VERIO) w/Device KIT Use As Directed 08/22/22   Myrlene Broker, MD  dicyclomine (BENTYL) 10 MG capsule Take 20 mg every 4-6 hours as needed 10/06/22   Hilarie Fredrickson, MD  glucose blood (ONETOUCH VERIO) test strip USE 1 STRIP TO CHECK GLUCOSE ONCE DAILY  07/31/22   Reather Littler, MD  latanoprost (XALATAN) 0.005 % ophthalmic solution Place 1 drop into both eyes at bedtime.    [provider]  levothyroxine (SYNTHROID) 112 MCG tablet TAKE 1 TABLET BY MOUTH ONCE DAILY BEFORE BREAKFAST 03/12/23   Myrlene Broker, MD  lisinopril-hydrochlorothiazide (ZESTORETIC) 20-25 MG tablet Take 1 tablet by mouth daily. 03/02/23   Myrlene Broker, MD  Melatonin 10 MG TABS Take by mouth at bedtime as needed.    [provider]  metFORMIN (GLUCOPHAGE-XR) 500 MG 24 hr tablet TAKE 3 TABLETS BY MOUTH ONCE DAILY WITH BREAKFAST 02/13/23   Myrlene Broker, MD  montelukast (SINGULAIR) 10 MG tablet Take 10 mg by mouth at bedtime.    [provider]  Multiple Vitamins-Minerals (MULTIVITAMIN PO) Take by mouth. 2 chew tabs daily    [provider]  omeprazole (PRILOSEC) 20 MG capsule Take 1 capsule (20 mg total) by mouth daily. 05/08/22   Hilarie Fredrickson, MD  OneTouch Delica Lancets 30G MISC USE 1  TO CHECK GLUCOSE ONCE DAILY 07/12/22   Reather Littler, MD  timolol (BETIMOL) 0.5 % ophthalmic solution 1 drop 2 (two) times daily.    [provider]  UNABLE TO FIND Clindomycin ear drop 4 drops 3 x week to left ear on Monday 2023-05-14 and friday    [provider]    Family History Family History  Problem Relation Age of Onset   Other Father 3       deceased, unknown causes in 05-14-2023   Hypertension Mother    Other Mother 60       SBO, deceased   Thyroid disease Brother    Thyroid disease Sister    Breast cancer Neg Hx    Colon cancer Neg Hx    Esophageal cancer Neg Hx    Rectal cancer Neg Hx    Stomach cancer Neg Hx    Diabetes Neg Hx     Social History Social History   Tobacco Use   Smoking status: Never   Smokeless tobacco: Never  Vaping Use   Vaping Use: Never used  Substance Use Topics   Alcohol use: Yes    Comment: social   Drug use: No     Allergies   Flagyl [metronidazole] and No known  allergies   Review of Systems Review of Systems   Physical Exam Triage Vital Signs ED Triage Vitals [04/02/23 1428]  Enc Vitals Group     BP (!) 157/85     Pulse Rate 88     Resp 17  Temp 99 F (37.2 C)     Temp Source Oral     SpO2 97 %     Weight      Height      Head Circumference      Peak Flow      Pain Score 5     Pain Loc      Pain Edu?      Excl. in GC?    No data found.  Updated Vital Signs BP (!) 157/85 (BP Location: Left Arm)   Pulse 88   Temp 99 F (37.2 C) (Oral)   Resp 17   SpO2 97%   Visual Acuity Right Eye Distance:   Left Eye Distance:   Bilateral Distance:    Right Eye Near:   Left Eye Near:    Bilateral Near:     Physical Exam Constitutional:      General: She is not in acute distress.    Appearance: Normal appearance. She is well-developed. She is ill-appearing. She is not toxic-appearing.  HENT:     Right Ear: Tympanic membrane, ear canal and external ear normal.     Left Ear: Tympanic membrane, ear canal and external ear normal. A PE tube is present.     Nose: Congestion present.     Mouth/Throat:     Mouth: Mucous membranes are moist.     Pharynx: Oropharynx is clear. No oropharyngeal exudate or posterior oropharyngeal erythema.  Cardiovascular:     Rate and Rhythm: Normal rate and regular rhythm.  Pulmonary:     Effort: Pulmonary effort is normal.     Breath sounds: Normal breath sounds.  Lymphadenopathy:     Head:     Right side of head: No submandibular adenopathy.     Left side of head: No submandibular adenopathy.  Neurological:     Mental Status: She is alert.      UC Treatments / Results  Labs (all labs ordered are listed, but only abnormal results are displayed) Labs Reviewed  SARS CORONAVIRUS 2 (TAT 6-24 HRS)    EKG   Radiology No results found.  Procedures Procedures (including critical care time)  Medications Ordered in UC Medications - No data to display  Initial Impression / Assessment  and Plan / UC Course  I have reviewed the triage vital signs and the nursing notes.  Pertinent labs & imaging results that were available during my care of the patient were reviewed by me and considered in my medical decision making (see chart for details).    Will test for COVID.  Results pending.  Reviewed supportive care measures.  Final Clinical Impressions(s) / UC Diagnoses   Final diagnoses:  Upper respiratory tract infection, unspecified type     Discharge Instructions      You will get a call if test is positive, you will not get a call if test is negative but you can check results in MyChart if you have a MyChart account.   Continue taking your over the counter cold medicines to help manage her symptoms until you get better.  I also suggest taking Mucinex (okay to take generic guaifenesin) and saline nasal spray to help your congestion drain.  You can use saline nasal spray several times a day.   ED Prescriptions   None    PDMP not reviewed this encounter.   Cathlyn Parsons, NP 04/02/23 [phone]

## 2023-04-02 NOTE — Discharge Instructions (Signed)
You will get a call if test is positive, you will not get a call if test is negative but you can check results in MyChart if you have a MyChart account.   Continue taking your over the counter cold medicines to help manage her symptoms until you get better.  I also suggest taking Mucinex (okay to take generic guaifenesin) and saline nasal spray to help your congestion drain.  You can use saline nasal spray several times a day.

## 2023-04-02 NOTE — ED Triage Notes (Signed)
Pt presents with c/o nasal congestion, sore throat, bilateral ear aches, dry cough X 4 days.   C/o loss of taste and smell.

## 2023-04-03 ENCOUNTER — Telehealth: Payer: Self-pay | Admitting: Internal Medicine

## 2023-04-03 LAB — SARS CORONAVIRUS 2 (TAT 6-24 HRS): SARS Coronavirus 2: POSITIVE — AB

## 2023-04-03 NOTE — Telephone Encounter (Signed)
Ok for virtual with anyone today or virtual care now to help with antiviral prescription

## 2023-04-03 NOTE — Telephone Encounter (Signed)
Patient called and said she tested positive for covid yesterday 04/02/23 at urgent care. She has a cough with yellow phlegm. Urgent cared declined to prescribe her any medication to help. She said today is day 4 of her symptoms. Patient would like a call back at [phone].

## 2023-04-04 ENCOUNTER — Ambulatory Visit (INDEPENDENT_AMBULATORY_CARE_PROVIDER_SITE_OTHER): Payer: Medicare HMO

## 2023-04-04 ENCOUNTER — Encounter: Payer: Self-pay | Admitting: Internal Medicine

## 2023-04-04 ENCOUNTER — Ambulatory Visit (INDEPENDENT_AMBULATORY_CARE_PROVIDER_SITE_OTHER): Payer: Medicare HMO | Admitting: Internal Medicine

## 2023-04-04 VITALS — BP 128/82 | HR 85 | Temp 98.6°F | Ht 66.0 in | Wt 164.0 lb

## 2023-04-04 DIAGNOSIS — I10 Essential (primary) hypertension: Secondary | ICD-10-CM | POA: Diagnosis not present

## 2023-04-04 DIAGNOSIS — U071 COVID-19: Secondary | ICD-10-CM

## 2023-04-04 DIAGNOSIS — R0602 Shortness of breath: Secondary | ICD-10-CM | POA: Diagnosis not present

## 2023-04-04 DIAGNOSIS — H7312 Chronic myringitis, left ear: Secondary | ICD-10-CM

## 2023-04-04 DIAGNOSIS — R053 Chronic cough: Secondary | ICD-10-CM | POA: Diagnosis not present

## 2023-04-04 MED ORDER — HYDROCODONE BIT-HOMATROP MBR 5-1.5 MG/5ML PO SOLN: 5.0000 mL | Freq: Four times a day (QID) | ORAL | 0 refills | Status: AC | PRN

## 2023-04-04 MED ORDER — ALBUTEROL SULFATE HFA 108 (90 BASE) MCG/ACT IN AERS: 2.0000 | INHALATION_SPRAY | Freq: Four times a day (QID) | RESPIRATORY_TRACT | 1 refills | Status: DC | PRN

## 2023-04-04 MED ORDER — NIRMATRELVIR/RITONAVIR (PAXLOVID)TABLET: 3.0000 | ORAL_TABLET | Freq: Two times a day (BID) | ORAL | 0 refills | Status: AC

## 2023-04-04 NOTE — Telephone Encounter (Signed)
Pt has been scheduled to be seen today in office!

## 2023-04-04 NOTE — Patient Instructions (Signed)
Please take all new medication as prescribed - the paxlovid antibiotic, cough medicine, and inhaler  You can also take Mucinex (or it's generic off brand) for congestion, and tylenol as needed for pain.  Please continue all other medications as before, and refills have been done if requested.  Please have the pharmacy call with any other refills you may need.  Please keep your appointments with your specialists as you may have planned  Please go to the XRAY Department in the first floor for the x-ray testing  You will be contacted by phone if any changes need to be made immediately.  Otherwise, you will receive a letter about your results with an explanation, but please check with MyChart first.  Please remember to sign up for MyChart if you have not done so, as this will be important to you in the future with finding out test results, communicating by private email, and scheduling acute appointments online when needed.

## 2023-04-04 NOTE — Progress Notes (Signed)
Patient ID: Sonya Brady, female   DOB: [birth year], 72 y.o.   MRN: [id number]        Chief Complaint: follow up covid infection, htn, left ear       HPI:  Sonya Brady is a 72 y.o. female  Here with 2-3 days acute onset fever, facial pain, pressure, headache, general weakness and malaise, and clearish d/c, with mild ST and cough, but pt denies chest pain, wheezing, increased sob or doe, orthopnea, PND, increased LE swelling, palpitations, dizziness or syncope.  Was found to have Covid + testing yesterday.  Pt interested in tx.   Pt denies polydipsia, polyuria, or new focal neuro s/s.    Pt denies wt loss, night sweats, loss of appetite, or other constitutional symptoms         Wt Readings from Last 3 Encounters:  04/04/23 164 lb (74.4 kg)  02/16/23 165 lb (74.8 kg)  09/28/22 169 lb (76.7 kg)   BP Readings from Last 3 Encounters:  04/04/23 128/82  04/02/23 (!) 157/85  02/16/23 130/80         Past Medical History:  Diagnosis Date   Acne    Allergy    Arthritis    Carpal tunnel syndrome of left wrist 10/21/2021   Complication of anesthesia    could hear sounds with r breast biopsy yrs ago,   Depression    situational when husband was sick and dying.   Diabetes mellitus Type 2    Diverticulosis    GERD (gastroesophageal reflux disease)    Goiter    benign   right 5.7 cm/3.0x1.8cm left 5.2 x 2.4 x 2.1 cm per 05-09-2019 [place] epic no further follow up needed   H. pylori infection    History of COVID-19 2020   sick x 1 week all symptoms resolved   Hypercholesteremia    Hypertension    Hypothyroidism    Insomnia    PONV (postoperative nausea and vomiting)    x 1 yrs ago per pt on 10-24-2021   Tinnitus, left ear 10/24/2021   for last 30 yrs   Unspecified Eustachian tube disorder, left ear    Past Surgical History:  Procedure Laterality Date   ABDOMINAL HYSTERECTOMY     both ovary intact age 22   BREAST LUMPECTOMY Bilateral    benign mmore than 35 yrs ago   CARPAL TUNNEL  RELEASE Left 10/27/2021   Procedure: CARPAL TUNNEL RELEASE;  Surgeon: Gomez Cleverly, MD;  Location: [facility] [place];  Service: Orthopedics;  Laterality: Left;   carpel tunnel surgery Right    right 30 yrs ago per pt on 10-24-2021   CARPOMETACARPEL SUSPENSION PLASTY Left 10/27/2021   Procedure: left thumb carpometacarpal joint arthroplasty with tendon transfer;  Surgeon: Gomez Cleverly, MD;  Location: [facility];  Service: Orthopedics;  Laterality: Left;  with MAC   EXCISION MASS UPPER EXTREMETIES Right 06/05/2022   Procedure: EXCISION MASS RIGHT SHOULDER;  Surgeon: Luretha Murphy, MD;  Location: [facility];  Service: General;  Laterality: Right;   EYE SURGERY     cataracts yrs ago per pt on 10-24-2021   left ear eardrum reconstruction     ponv after 40 yrs ago   right lower abdominal mass benign removed  1978   TYMPANOPLASTY     tube left ear multiple times ober last 44 years   UPPER GI ENDOSCOPY     2014 and 2018    reports that she has never smoked.  She has never used smokeless tobacco. She reports current alcohol use. She reports that she does not use drugs. family history includes Hypertension in her mother; Other (age of onset: 58) in her father; Other (age of onset: 87) in her mother; Thyroid disease in her brother and sister. Allergies  Allergen Reactions   Flagyl [Metronidazole]     Redness on arms   No Known Allergies    Current Outpatient Medications on File Prior to Visit  Medication Sig Dispense Refill   atorvastatin (LIPITOR) 80 MG tablet Take 1 tablet by mouth once daily 90 tablet 0   Blood Glucose Monitoring Suppl (ONETOUCH VERIO) w/Device KIT Use As Directed 1 kit 0   dicyclomine (BENTYL) 10 MG capsule Take 20 mg every 4-6 hours as needed 60 capsule 1   glucose blood (ONETOUCH VERIO) test strip USE 1 STRIP TO CHECK GLUCOSE ONCE DAILY 75 each 1   latanoprost (XALATAN) 0.005 % ophthalmic solution Place 1 drop into both eyes at  bedtime.     levothyroxine (SYNTHROID) 112 MCG tablet TAKE 1 TABLET BY MOUTH ONCE DAILY BEFORE BREAKFAST 90 tablet 0   lisinopril-hydrochlorothiazide (ZESTORETIC) 20-25 MG tablet Take 1 tablet by mouth daily. 90 tablet 3   Melatonin 10 MG TABS Take by mouth at bedtime as needed.     metFORMIN (GLUCOPHAGE-XR) 500 MG 24 hr tablet TAKE 3 TABLETS BY MOUTH ONCE DAILY WITH BREAKFAST 90 tablet 0   montelukast (SINGULAIR) 10 MG tablet Take 10 mg by mouth at bedtime.     Multiple Vitamins-Minerals (MULTIVITAMIN PO) Take by mouth. 2 chew tabs daily     omeprazole (PRILOSEC) 20 MG capsule Take 1 capsule (20 mg total) by mouth daily. 90 capsule 3   OneTouch Delica Lancets 30G MISC USE 1  TO CHECK GLUCOSE ONCE DAILY 100 each 0   timolol (BETIMOL) 0.5 % ophthalmic solution 1 drop 2 (two) times daily.     UNABLE TO FIND Clindomycin ear drop 4 drops 3 x week to left ear on Monday Wednesday and friday     No current facility-administered medications on file prior to visit.        ROS:  All others reviewed and negative.  Objective        PE:  BP 128/82 (BP Location: Right Arm, Patient Position: Sitting, Cuff Size: Normal)   Pulse 85   Temp 98.6 F (37 C) (Oral)   Ht 5\' 6"  (1.676 m)   Wt 164 lb (74.4 kg)   SpO2 99%   BMI 26.47 kg/m                 Constitutional: Pt appears in NAD               HENT: Head: NCAT.                Right Ear: External ear normal.                 Left Ear: External ear normal. Bilat tm's with mild erythema.  Max sinus areas non tender.  Pharynx with mild erythema, no exudate               Eyes: . Pupils are equal, round, and reactive to light. Conjunctivae and EOM are normal               Nose: without d/c or deformity               Neck: Neck supple. Gross normal ROM  Cardiovascular: Normal rate and regular rhythm.                 Pulmonary/Chest: Effort normal and breath sounds without rales or wheezing.                               Neurological: Pt is  alert. At baseline orientation, motor grossly intact               Skin: Skin is warm. No rashes, no other new lesions, LE edema - none               Psychiatric: Pt behavior is normal without agitation   Micro: none  Cardiac tracings I have personally interpreted today:  none  Pertinent Radiological findings (summarize): none   Lab Results  Component Value Date   WBC 4.4 02/16/2023   HGB 13.5 02/16/2023   HCT 39.8 02/16/2023   PLT 237.0 02/16/2023   GLUCOSE 118 (H) 02/16/2023   CHOL 147 02/16/2023   TRIG 97.0 02/16/2023   HDL 43.50 02/16/2023   LDLCALC 84 02/16/2023   ALT 12 02/16/2023   AST 13 02/16/2023   NA 138 02/16/2023   K 3.8 02/16/2023   CL 101 02/16/2023   CREATININE 0.69 02/16/2023   BUN 15 02/16/2023   CO2 27 02/16/2023   TSH 1.23 02/16/2023   HGBA1C 6.2 02/16/2023   MICROALBUR <0.7 02/16/2023   Assessment/Plan:  Sonya Brady is a 72 y.o. White or Caucasian [1] female with  has a past medical history of Acne, Allergy, Arthritis, Carpal tunnel syndrome of left wrist (10/21/2021), Complication of anesthesia, Depression, Diabetes mellitus Type 2, Diverticulosis, GERD (gastroesophageal reflux disease), Goiter, H. pylori infection, History of COVID-19 (2020), Hypercholesteremia, Hypertension, Hypothyroidism, Insomnia, PONV (postoperative nausea and vomiting), Tinnitus, left ear (10/24/2021), and Unspecified Eustachian tube disorder, left ear.  Chronic myringitis, left ear Stable overall,  to f/u any worsening symptoms or concerns  COVID-19 virus infection Mild to mod, for paxlovid course asd x 5 days, cough med prn, and inhaler prn,  to f/u any worsening symptoms or concerns  Essential hypertension BP Readings from Last 3 Encounters:  04/04/23 128/82  04/02/23 (!) 157/85  02/16/23 130/80   Stable, pt to continue medical treatment zestoretic 20-25 mg qd  Followup: Return if symptoms worsen or fail to improve.  Oliver Barre, MD 04/07/2023 3:14 PM [facility]

## 2023-04-06 DIAGNOSIS — H9201 Otalgia, right ear: Secondary | ICD-10-CM | POA: Diagnosis not present

## 2023-04-07 ENCOUNTER — Encounter: Payer: Self-pay | Admitting: Internal Medicine

## 2023-04-07 NOTE — Assessment & Plan Note (Signed)
Stable overall,  to f/u any worsening symptoms or concerns 

## 2023-04-07 NOTE — Assessment & Plan Note (Signed)
Mild to mod, for paxlovid course asd x 5 days, cough med prn, and inhaler prn,  to f/u any worsening symptoms or concerns

## 2023-04-07 NOTE — Assessment & Plan Note (Signed)
BP Readings from Last 3 Encounters:  04/04/23 128/82  04/02/23 (!) 157/85  02/16/23 130/80   Stable, pt to continue medical treatment zestoretic 20-25 mg qd

## 2023-04-13 ENCOUNTER — Other Ambulatory Visit: Payer: Self-pay | Admitting: Internal Medicine

## 2023-04-17 ENCOUNTER — Other Ambulatory Visit: Payer: Self-pay

## 2023-04-17 ENCOUNTER — Telehealth: Payer: Self-pay | Admitting: Internal Medicine

## 2023-04-17 DIAGNOSIS — E118 Type 2 diabetes mellitus with unspecified complications: Secondary | ICD-10-CM

## 2023-04-17 MED ORDER — METFORMIN HCL ER 500 MG PO TB24: 500.0000 mg | ORAL_TABLET | Freq: Every day | ORAL | 3 refills | Status: DC

## 2023-04-17 MED ORDER — ONETOUCH VERIO W/DEVICE KIT
PACK | 0 refills | Status: AC
Start: 2023-04-17 — End: ?

## 2023-04-17 MED ORDER — LEVOTHYROXINE SODIUM 112 MCG PO TABS: 112.0000 ug | ORAL_TABLET | Freq: Every day | ORAL | 3 refills | Status: DC

## 2023-04-17 MED ORDER — ATORVASTATIN CALCIUM 80 MG PO TABS: 80.0000 mg | ORAL_TABLET | Freq: Every day | ORAL | 3 refills | Status: DC

## 2023-04-17 MED ORDER — LISINOPRIL-HYDROCHLOROTHIAZIDE 20-25 MG PO TABS: 1.0000 | ORAL_TABLET | Freq: Every day | ORAL | 3 refills | Status: DC

## 2023-04-17 NOTE — Telephone Encounter (Signed)
fixed

## 2023-04-17 NOTE — Telephone Encounter (Signed)
Patient called and said her medications are only being filled for 30 days and that her pharmacy is telling her she needs an appointment for future refills. Patient was seen in May for her physical and she said she usually gets more medication in her refill. She would like a call back to see what's going on. Best callback is [phone].

## 2023-05-10 ENCOUNTER — Encounter: Payer: Self-pay | Admitting: Internal Medicine

## 2023-05-10 ENCOUNTER — Ambulatory Visit: Payer: Medicare HMO | Admitting: Internal Medicine

## 2023-05-10 VITALS — BP 122/80 | HR 76 | Ht 66.0 in | Wt 164.0 lb

## 2023-05-10 DIAGNOSIS — K219 Gastro-esophageal reflux disease without esophagitis: Secondary | ICD-10-CM | POA: Diagnosis not present

## 2023-05-10 DIAGNOSIS — K5792 Diverticulitis of intestine, part unspecified, without perforation or abscess without bleeding: Secondary | ICD-10-CM

## 2023-05-10 MED ORDER — OMEPRAZOLE 20 MG PO CPDR: 20.0000 mg | DELAYED_RELEASE_CAPSULE | Freq: Every day | ORAL | 3 refills | Status: DC

## 2023-05-10 NOTE — Patient Instructions (Signed)
_______________________________________________________  If your blood pressure at your visit was 140/90 or greater, please contact your primary care physician to follow up on this.  _______________________________________________________  If you are age 73 or older, your body mass index should be between 23-30. Your Body mass index is 26.47 kg/m. If this is out of the aforementioned range listed, please consider follow up with your Primary Care Provider.  If you are age 49 or younger, your body mass index should be between 19-25. Your Body mass index is 26.47 kg/m. If this is out of the aformentioned range listed, please consider follow up with your Primary Care Provider.   ________________________________________________________  The [facility] providers would like to encourage you to use [facility] to communicate with providers for non-urgent requests or questions.  Due to long hold times on the telephone, sending your provider a message by [facility] may be a faster and more efficient way to get a response.  Please allow 48 business hours for a response.  Please remember that this is for non-urgent requests.  _______________________________________________________  Follow up in 1 year.   It was a pleasure to see you today!  Thank you for trusting me with your gastrointestinal care!

## 2023-05-10 NOTE — Progress Notes (Signed)
HISTORY OF PRESENT ILLNESS:  Sonya Brady is a 72 y.o. female with past medical history as listed below who is followed in this office for GERD complicated by peptic stricture, colon cancer screening, and a history of acute diverticulitis.  She was last seen in the office November 30 first 2023.  See that dictation.  At that time she was doing well.  She was continued on her PPI and asked to follow-up at this time.  Patient tells me that she continues to take omeprazole 20 mg daily.  This controls her reflux symptoms.  No recurrent dysphagia.  She does request medication refill.  GI review of systems is otherwise negative.  She does have questions regarding diet and history of diverticulitis.  Her last upper endoscopy with dilation was performed April 2018.  Her last colonoscopy was also performed April 2018.  Negative for neoplasia.  Follow-up in 10 years recommended.  Review of blood work from Feb 16, 2023 shows unremarkable comprehensive metabolic panel and a CBC.  Hemoglobin was normal at 13.5.  REVIEW OF SYSTEMS:  All non-GI ROS negative unless otherwise stated in the HPI except for sleeping problems  Past Medical History:  Diagnosis Date   Acne    Allergy    Arthritis    Carpal tunnel syndrome of left wrist 10/21/2021   Complication of anesthesia    could hear sounds with r breast biopsy yrs ago,   Depression    situational when husband was sick and dying.   Diabetes mellitus Type 2    Diverticulosis    GERD (gastroesophageal reflux disease)    Goiter    benign   right 5.7 cm/3.0x1.8cm left 5.2 x 2.4 x 2.1 cm per 05-09-2019 [place] epic no further follow up needed   H. pylori infection    History of COVID-19 2020   sick x 1 week all symptoms resolved   Hypercholesteremia    Hypertension    Hypothyroidism    Insomnia    PONV (postoperative nausea and vomiting)    x 1 yrs ago per pt on 10-24-2021   Tinnitus, left ear 10/24/2021   for last 30 yrs   Unspecified Eustachian tube  disorder, left ear     Past Surgical History:  Procedure Laterality Date   ABDOMINAL HYSTERECTOMY     both ovary intact age 40   BREAST LUMPECTOMY Bilateral    benign mmore than 35 yrs ago   CARPAL TUNNEL RELEASE Left 10/27/2021   Procedure: CARPAL TUNNEL RELEASE;  Surgeon: Gomez Cleverly, MD;  Location: [facility] [place];  Service: Orthopedics;  Laterality: Left;   carpel tunnel surgery Right    right 30 yrs ago per pt on 10-24-2021   CARPOMETACARPEL SUSPENSION PLASTY Left 10/27/2021   Procedure: left thumb carpometacarpal joint arthroplasty with tendon transfer;  Surgeon: Gomez Cleverly, MD;  Location: [facility];  Service: Orthopedics;  Laterality: Left;  with MAC   EXCISION MASS UPPER EXTREMETIES Right 06/05/2022   Procedure: EXCISION MASS RIGHT SHOULDER;  Surgeon: Luretha Murphy, MD;  Location: [facility];  Service: General;  Laterality: Right;   EYE SURGERY     cataracts yrs ago per pt on 10-24-2021   left ear eardrum reconstruction     ponv after 40 yrs ago   right lower abdominal mass benign removed  1978   TYMPANOPLASTY     tube left ear multiple times ober last 44 years   UPPER GI ENDOSCOPY     2014  and 2018    Social History Sonya Brady  reports that she has never smoked. She has never used smokeless tobacco. She reports current alcohol use. She reports that she does not use drugs.  family history includes Hypertension in her mother; Other (age of onset: 84) in her father; Other (age of onset: 71) in her mother; Thyroid disease in her brother and sister.  Allergies  Allergen Reactions   Flagyl [Metronidazole]     Redness on arms   No Known Allergies        PHYSICAL EXAMINATION: Vital signs: BP 122/80   Pulse 76   Ht 5\' 6"  (1.676 m)   Wt 164 lb (74.4 kg)   BMI 26.47 kg/m   Constitutional: generally well-appearing, no acute distress Psychiatric: alert and oriented x3, cooperative Eyes: extraocular movements intact,  anicteric, conjunctiva pink Mouth: oral pharynx moist, no lesions Neck: supple no lymphadenopathy Cardiovascular: heart regular rate and rhythm, no murmur Lungs: clear to auscultation bilaterally Abdomen: soft, nontender, nondistended, no obvious ascites, no peritoneal signs, normal bowel sounds, no organomegaly Rectal: Omitted Extremities: no clubbing, cyanosis, or lower extremity edema bilaterally Skin: no lesions on visible extremities Neuro: No focal deficits.  Cranial nerves intact  ASSESSMENT:   1.  GERD complicated by peptic stricture.  Asymptomatic post dilation on PPI 2.  History of acute diverticulitis.  No recurrent problems since her last bout several years ago 3.  Negative screening colonoscopy 2018     PLAN:   1.  Reflux precautions 2.  Continue omeprazole 20 mg daily 3.  Refill omeprazole for 1 year.  Medication risks reviewed 4.  High-fiber diet 5.  Screening colonoscopy around 2028 6.  Routine office follow-up 1 year.  Contact the office in the interim for any questions or problems

## 2023-05-16 ENCOUNTER — Other Ambulatory Visit: Payer: Self-pay | Admitting: Internal Medicine

## 2023-05-16 ENCOUNTER — Telehealth: Payer: Self-pay | Admitting: Internal Medicine

## 2023-05-16 NOTE — Telephone Encounter (Signed)
Patient called and said she has been taking metFORMIN (GLUCOPHAGE-XR) 500 MG 24 hr tablet 3 times a day for approximately a year. The instructions on the medication say to take 1 a day. She said she had gotten it fixed in the system. She wanted to know if it could be updated so she could get a 90 day supply of the amount she is taking. Patient would like a call back at [phone].

## 2023-05-16 NOTE — Telephone Encounter (Signed)
Called pt back inform her the prescription was sent back to [facility] this am.. 3 day.Marland KitchenRaechel Chute

## 2023-06-27 DIAGNOSIS — H6522 Chronic serous otitis media, left ear: Secondary | ICD-10-CM | POA: Diagnosis not present

## 2023-06-27 DIAGNOSIS — H7312 Chronic myringitis, left ear: Secondary | ICD-10-CM | POA: Diagnosis not present

## 2023-07-06 ENCOUNTER — Ambulatory Visit (INDEPENDENT_AMBULATORY_CARE_PROVIDER_SITE_OTHER): Payer: Medicare HMO

## 2023-07-06 DIAGNOSIS — Z23 Encounter for immunization: Secondary | ICD-10-CM

## 2023-07-06 NOTE — Progress Notes (Signed)
Patient presented in office today for her HD Flu Vaccine. HD Flu Vaccine administered into her Right Deltoid Muscle. Patient tolerated injection well and her injection site looked really well also. Advised patient to return to the office immediately if she noticed any adverse reactions.

## 2023-07-25 ENCOUNTER — Ambulatory Visit (INDEPENDENT_AMBULATORY_CARE_PROVIDER_SITE_OTHER): Payer: Medicare HMO | Admitting: Internal Medicine

## 2023-07-25 ENCOUNTER — Encounter: Payer: Self-pay | Admitting: Internal Medicine

## 2023-07-25 VITALS — BP 124/82 | HR 70 | Temp 98.2°F | Ht 66.0 in | Wt 164.0 lb

## 2023-07-25 DIAGNOSIS — Z7984 Long term (current) use of oral hypoglycemic drugs: Secondary | ICD-10-CM

## 2023-07-25 DIAGNOSIS — R011 Cardiac murmur, unspecified: Secondary | ICD-10-CM

## 2023-07-25 DIAGNOSIS — R300 Dysuria: Secondary | ICD-10-CM | POA: Diagnosis not present

## 2023-07-25 DIAGNOSIS — E039 Hypothyroidism, unspecified: Secondary | ICD-10-CM | POA: Diagnosis not present

## 2023-07-25 DIAGNOSIS — E118 Type 2 diabetes mellitus with unspecified complications: Secondary | ICD-10-CM

## 2023-07-25 DIAGNOSIS — I10 Essential (primary) hypertension: Secondary | ICD-10-CM

## 2023-07-25 LAB — URINALYSIS, ROUTINE W REFLEX MICROSCOPIC
Bilirubin Urine: NEGATIVE
Ketones, ur: NEGATIVE
Nitrite: NEGATIVE
Specific Gravity, Urine: 1.02 (ref 1.000–1.030)
Total Protein, Urine: 100 — AB
Urine Glucose: NEGATIVE
Urobilinogen, UA: 0.2 (ref 0.0–1.0)
pH: 6 (ref 5.0–8.0)

## 2023-07-25 MED ORDER — CEPHALEXIN 500 MG PO CAPS
500.0000 mg | ORAL_CAPSULE | Freq: Three times a day (TID) | ORAL | 0 refills | Status: DC
Start: 2023-07-25 — End: 2023-08-10

## 2023-07-25 NOTE — Progress Notes (Signed)
Patient ID: Sonya Brady, female   DOB: Jun 10, 1951, 72 y.o.   MRN: [id number]        Chief Complaint: follow up dysuria, heart murmur, dm, htn low thyroid       HPI:  Sonya Brady is a 72 y.o. female here with c/o dysuria x 3 days, Denies urinary symptoms such as frequency, urgency, flank pain, hematuria or n/v, fever, chills.  Pt denies chest pain, increased sob or doe, wheezing, orthopnea, PND, increased LE swelling, palpitations, dizziness or syncope.   Pt denies polydipsia, polyuria, or new focal neuro s/s.    Pt denies fever, wt loss, night sweats, loss of appetite, or other constitutional symptoms         Wt Readings from Last 3 Encounters:  07/25/23 164 lb (74.4 kg)  05/10/23 164 lb (74.4 kg)  04/04/23 164 lb (74.4 kg)   BP Readings from Last 3 Encounters:  07/25/23 124/82  05/10/23 122/80  04/04/23 128/82         Past Medical History:  Diagnosis Date   Acne    Allergy    Arthritis    Carpal tunnel syndrome of left wrist 10/21/2021   Complication of anesthesia    could hear sounds with r breast biopsy yrs ago,   Depression    situational when husband was sick and dying.   Diabetes mellitus Type 2    Diverticulosis    GERD (gastroesophageal reflux disease)    Goiter    benign   right 5.7 cm/3.0x1.8cm left 5.2 x 2.4 x 2.1 cm per 05-09-2019 [place] epic no further follow up needed   H. pylori infection    History of COVID-19 2020   sick x 1 week all symptoms resolved   Hypercholesteremia    Hypertension    Hypothyroidism    Insomnia    PONV (postoperative nausea and vomiting)    x 1 yrs ago per pt on 10-24-2021   Tinnitus, left ear 10/24/2021   for last 30 yrs   Unspecified Eustachian tube disorder, left ear    Past Surgical History:  Procedure Laterality Date   ABDOMINAL HYSTERECTOMY     both ovary intact age 28   BREAST LUMPECTOMY Bilateral    benign mmore than 35 yrs ago   CARPAL TUNNEL RELEASE Left 10/27/2021   Procedure: CARPAL TUNNEL RELEASE;  Surgeon: Gomez Cleverly, MD;  Location: [facility];  Service: Orthopedics;  Laterality: Left;   carpel tunnel surgery Right    right 30 yrs ago per pt on 10-24-2021   CARPOMETACARPEL SUSPENSION PLASTY Left 10/27/2021   Procedure: left thumb carpometacarpal joint arthroplasty with tendon transfer;  Surgeon: Gomez Cleverly, MD;  Location: [facility];  Service: Orthopedics;  Laterality: Left;  with MAC   EXCISION MASS UPPER EXTREMETIES Right 06/05/2022   Procedure: EXCISION MASS RIGHT SHOULDER;  Surgeon: Luretha Murphy, MD;  Location: [facility];  Service: General;  Laterality: Right;   EYE SURGERY     cataracts yrs ago per pt on 10-24-2021   left ear eardrum reconstruction     ponv after 40 yrs ago   right lower abdominal mass benign removed  1978   TYMPANOPLASTY     tube left ear multiple times ober last 44 years   UPPER GI ENDOSCOPY     2014 and 2018    reports that she has never smoked. She has never used smokeless tobacco. She reports current alcohol use. She reports that she  does not use drugs. family history includes Hypertension in her mother; Other (age of onset: 18) in her father; Other (age of onset: 67) in her mother; Thyroid disease in her brother and sister. Allergies  Allergen Reactions   Flagyl [Metronidazole]     Redness on arms   No Known Allergies    Current Outpatient Medications on File Prior to Visit  Medication Sig Dispense Refill   atorvastatin (LIPITOR) 80 MG tablet Take 1 tablet (80 mg total) by mouth daily. 90 tablet 3   Blood Glucose Monitoring Suppl (ONETOUCH VERIO) w/Device KIT Use As Directed 1 kit 0   dicyclomine (BENTYL) 10 MG capsule Take 20 mg every 4-6 hours as needed 60 capsule 1   glucose blood (ONETOUCH VERIO) test strip USE 1 STRIP TO CHECK GLUCOSE ONCE DAILY 75 each 1   latanoprost (XALATAN) 0.005 % ophthalmic solution Place 1 drop into both eyes at bedtime.     levothyroxine (SYNTHROID) 112 MCG tablet Take 1 tablet (112  mcg total) by mouth daily before breakfast. 90 tablet 3   lisinopril-hydrochlorothiazide (ZESTORETIC) 20-25 MG tablet Take 1 tablet by mouth daily. 90 tablet 3   Melatonin 10 MG TABS Take by mouth at bedtime as needed.     metFORMIN (GLUCOPHAGE-XR) 500 MG 24 hr tablet Take 3 tablets (1,500 mg total) by mouth daily with breakfast. 90 tablet 5   montelukast (SINGULAIR) 10 MG tablet Take 10 mg by mouth at bedtime.     Multiple Vitamins-Minerals (MULTIVITAMIN PO) Take by mouth. 2 chew tabs daily     omeprazole (PRILOSEC) 20 MG capsule Take 1 capsule (20 mg total) by mouth daily. 90 capsule 3   OneTouch Delica Lancets 30G MISC USE 1  TO CHECK GLUCOSE ONCE DAILY 100 each 0   timolol (BETIMOL) 0.5 % ophthalmic solution 1 drop 2 (two) times daily.     UNABLE TO FIND Clindomycin ear drop 4 drops 3 x week to left ear on Monday Wednesday and friday     albuterol (VENTOLIN HFA) 108 (90 Base) MCG/ACT inhaler Inhale 2 puffs into the lungs every 6 (six) hours as needed for wheezing or shortness of breath. (Patient not taking: Reported on 05/10/2023) 8 g 1   No current facility-administered medications on file prior to visit.        ROS:  All others reviewed and negative.  Objective        PE:  BP 124/82 (BP Location: Right Arm, Patient Position: Sitting, Cuff Size: Normal)   Pulse 70   Temp 98.2 F (36.8 C) (Oral)   Ht 5\' 6"  (1.676 m)   Wt 164 lb (74.4 kg)   SpO2 99%   BMI 26.47 kg/m                 Constitutional: Pt appears in NAD               HENT: Head: NCAT.                Right Ear: External ear normal.                 Left Ear: External ear normal.                Eyes: . Pupils are equal, round, and reactive to light. Conjunctivae and EOM are normal               Nose: without d/c or deformity  Neck: Neck supple. Gross normal ROM               Cardiovascular: Normal rate and regular rhythm. With Gr 3/6 ssys murmur RUSB                Pulmonary/Chest: Effort normal and breath  sounds without rales or wheezing.                Abd:  Soft, mild low suprapubic tender, ND, + BS, no organomegaly               Neurological: Pt is alert. At baseline orientation, motor grossly intact               Skin: Skin is warm. No rashes, no other new lesions, LE edema - none               Psychiatric: Pt behavior is normal without agitation   Micro: none  Cardiac tracings I have personally interpreted today:  none  Pertinent Radiological findings (summarize): none   Lab Results  Component Value Date   WBC 4.4 02/16/2023   HGB 13.5 02/16/2023   HCT 39.8 02/16/2023   PLT 237.0 02/16/2023   GLUCOSE 118 (H) 02/16/2023   CHOL 147 02/16/2023   TRIG 97.0 02/16/2023   HDL 43.50 02/16/2023   LDLCALC 84 02/16/2023   ALT 12 02/16/2023   AST 13 02/16/2023   NA 138 02/16/2023   K 3.8 02/16/2023   CL 101 02/16/2023   CREATININE 0.69 02/16/2023   BUN 15 02/16/2023   CO2 27 02/16/2023   TSH 1.23 02/16/2023   HGBA1C 6.2 02/16/2023   MICROALBUR <0.7 02/16/2023   Assessment/Plan:  JAYDY AZCARATE is a 72 y.o. White or Caucasian [1] female with  has a past medical history of Acne, Allergy, Arthritis, Carpal tunnel syndrome of left wrist (10/21/2021), Complication of anesthesia, Depression, Diabetes mellitus Type 2, Diverticulosis, GERD (gastroesophageal reflux disease), Goiter, H. pylori infection, History of COVID-19 (2020), Hypercholesteremia, Hypertension, Hypothyroidism, Insomnia, PONV (postoperative nausea and vomiting), Tinnitus, left ear (10/24/2021), and Unspecified Eustachian tube disorder, left ear.  Controlled type 2 diabetes mellitus (HCC) Lab Results  Component Value Date   HGBA1C 6.2 02/16/2023   Stable, pt to continue current medical treatment metformin ER 500 mg - 3 qd   Dysuria Mild to mod, for antibx course cephalexin course, ua and culture,  to f/u any worsening symptoms or concerns  Essential hypertension BP Readings from Last 3 Encounters:  07/25/23 124/82   05/10/23 122/80  04/04/23 128/82   Stable, pt to continue medical treatment zestroetic 20 25 qd   Heart murmur Asympt, declines ecg and echo for now,  to f/u any worsening symptoms or concerns   Hypothyroidism Lab Results  Component Value Date   TSH 1.23 02/16/2023   Stable, pt to continue levothyroxine 112 mcg qd  Followup: Return if symptoms worsen or fail to improve.  Oliver Barre, MD 07/28/2023 8:45 PM [facility]

## 2023-07-25 NOTE — Patient Instructions (Signed)
Please take all new medication as prescribed - the antibiotic  Please call or see your PCP if you have unusual shortness of breath or dizziness as you may wish to have the Echocardiogram to further evaluate the heart murmur  Please continue all other medications as before, and refills have been done if requested.  Please have the pharmacy call with any other refills you may need.  Please keep your appointments with your specialists as you may have planned  Please go to the LAB at the blood drawing area for the tests to be done - just the urine testing today  You will be contacted by phone if any changes need to be made immediately.  Otherwise, you will receive a letter about your results with an explanation, but please check with MyChart first.

## 2023-07-27 LAB — URINE CULTURE

## 2023-07-28 ENCOUNTER — Encounter: Payer: Self-pay | Admitting: Internal Medicine

## 2023-07-28 DIAGNOSIS — R011 Cardiac murmur, unspecified: Secondary | ICD-10-CM | POA: Insufficient documentation

## 2023-07-28 DIAGNOSIS — R300 Dysuria: Secondary | ICD-10-CM | POA: Insufficient documentation

## 2023-07-28 NOTE — Assessment & Plan Note (Signed)
Lab Results  Component Value Date   TSH 1.23 02/16/2023   Stable, pt to continue levothyroxine 112 mcg qd

## 2023-07-28 NOTE — Assessment & Plan Note (Signed)
Asympt, declines ecg and echo for now,  to f/u any worsening symptoms or concerns

## 2023-07-28 NOTE — Assessment & Plan Note (Addendum)
Mild to mod, for antibx course cephalexin course, ua and culture,  to f/u any worsening symptoms or concerns

## 2023-07-28 NOTE — Assessment & Plan Note (Signed)
Lab Results  Component Value Date   HGBA1C 6.2 02/16/2023   Stable, pt to continue current medical treatment metformin ER 500 mg - 3 qd

## 2023-07-28 NOTE — Assessment & Plan Note (Signed)
BP Readings from Last 3 Encounters:  07/25/23 124/82  05/10/23 122/80  04/04/23 128/82   Stable, pt to continue medical treatment zestroetic 20 25 qd

## 2023-08-04 ENCOUNTER — Emergency Department (HOSPITAL_BASED_OUTPATIENT_CLINIC_OR_DEPARTMENT_OTHER): Payer: Medicare HMO

## 2023-08-04 ENCOUNTER — Encounter (HOSPITAL_BASED_OUTPATIENT_CLINIC_OR_DEPARTMENT_OTHER): Payer: Self-pay | Admitting: Emergency Medicine

## 2023-08-04 ENCOUNTER — Emergency Department (HOSPITAL_BASED_OUTPATIENT_CLINIC_OR_DEPARTMENT_OTHER)
Admission: EM | Admit: 2023-08-04 | Discharge: 2023-08-04 | Disposition: A | Discharge status: Home / Self Care | Payer: Medicare HMO | Attending: Emergency Medicine | Admitting: Emergency Medicine

## 2023-08-04 ENCOUNTER — Telehealth: Payer: Self-pay | Admitting: Physician Assistant

## 2023-08-04 DIAGNOSIS — N281 Cyst of kidney, acquired: Secondary | ICD-10-CM | POA: Diagnosis not present

## 2023-08-04 DIAGNOSIS — K8020 Calculus of gallbladder without cholecystitis without obstruction: Secondary | ICD-10-CM | POA: Diagnosis not present

## 2023-08-04 DIAGNOSIS — K5792 Diverticulitis of intestine, part unspecified, without perforation or abscess without bleeding: Secondary | ICD-10-CM

## 2023-08-04 DIAGNOSIS — K5732 Diverticulitis of large intestine without perforation or abscess without bleeding: Secondary | ICD-10-CM | POA: Insufficient documentation

## 2023-08-04 LAB — URINALYSIS, ROUTINE W REFLEX MICROSCOPIC
Bilirubin Urine: NEGATIVE
Glucose, UA: NEGATIVE mg/dL
Hgb urine dipstick: NEGATIVE
Ketones, ur: NEGATIVE mg/dL
Nitrite: NEGATIVE
Protein, ur: NEGATIVE mg/dL
Specific Gravity, Urine: 1.005 (ref 1.005–1.030)
pH: 7 (ref 5.0–8.0)

## 2023-08-04 LAB — LIPASE, BLOOD: Lipase: 43 U/L (ref 11–51)

## 2023-08-04 LAB — COMPREHENSIVE METABOLIC PANEL
ALT: 15 U/L (ref 0–44)
AST: 16 U/L (ref 15–41)
Albumin: 4.6 g/dL (ref 3.5–5.0)
Alkaline Phosphatase: 84 U/L (ref 38–126)
Anion gap: 11 (ref 5–15)
BUN: 13 mg/dL (ref 8–23)
CO2: 26 mmol/L (ref 22–32)
Calcium: 10.5 mg/dL — ABNORMAL HIGH (ref 8.9–10.3)
Chloride: 98 mmol/L (ref 98–111)
Creatinine, Ser: 0.67 mg/dL (ref 0.44–1.00)
GFR, Estimated: >60 mL/min (ref >60)
Glucose, Bld: 112 mg/dL — ABNORMAL HIGH (ref 70–99)
Potassium: 4 mmol/L (ref 3.5–5.1)
Sodium: 135 mmol/L (ref 135–145)
Total Bilirubin: 1 mg/dL (ref 0.3–1.2)
Total Protein: 8.6 g/dL — ABNORMAL HIGH (ref 6.5–8.1)

## 2023-08-04 LAB — CBC
HCT: 39.2 % (ref 36.0–46.0)
Hemoglobin: 13.4 g/dL (ref 12.0–15.0)
MCH: 30 pg (ref 26.0–34.0)
MCHC: 34.2 g/dL (ref 30.0–36.0)
MCV: 87.7 fL (ref 80.0–100.0)
Platelets: 261 10*3/uL (ref 150–400)
RBC: 4.47 MIL/uL (ref 3.87–5.11)
RDW: 12.8 % (ref 11.5–15.5)
WBC: 11.5 10*3/uL — ABNORMAL HIGH (ref 4.0–10.5)
nRBC: 0 % (ref 0.0–0.2)

## 2023-08-04 MED ORDER — OXYCODONE HCL 5 MG PO TABS: 5.0000 mg | ORAL_TABLET | ORAL | 0 refills | Status: DC | PRN

## 2023-08-04 MED ORDER — IOHEXOL 300 MG/ML  SOLN
100.0000 mL | Freq: Once | INTRAMUSCULAR | Status: AC | PRN
  Administered 2023-08-04: 100 mL via INTRAVENOUS

## 2023-08-04 MED ORDER — FENTANYL CITRATE PF 50 MCG/ML IJ SOSY
25.0000 ug | PREFILLED_SYRINGE | Freq: Once | INTRAMUSCULAR | Status: AC
  Administered 2023-08-04: 25 ug via INTRAVENOUS
  Filled 2023-08-04: qty 1

## 2023-08-04 MED ORDER — ONDANSETRON HCL 4 MG/2ML IJ SOLN
4.0000 mg | Freq: Once | INTRAMUSCULAR | Status: AC
  Administered 2023-08-04: 4 mg via INTRAVENOUS
  Filled 2023-08-04: qty 2

## 2023-08-04 MED ORDER — AMOXICILLIN-POT CLAVULANATE 875-125 MG PO TABS: 1.0000 | ORAL_TABLET | Freq: Two times a day (BID) | ORAL | 0 refills | Status: AC

## 2023-08-04 MED ORDER — ONDANSETRON 4 MG PO TBDP: 4.0000 mg | ORAL_TABLET | Freq: Three times a day (TID) | ORAL | 0 refills | Status: DC | PRN

## 2023-08-04 MED ORDER — HYDROCODONE-ACETAMINOPHEN 5-325 MG PO TABS
1.0000 | ORAL_TABLET | Freq: Once | ORAL | Status: AC
  Administered 2023-08-04: 1 via ORAL
  Filled 2023-08-04: qty 1

## 2023-08-04 NOTE — ED Triage Notes (Signed)
Abdominal pain. Cramping " I feel like I'm having a baby" "I think its the diverticulitis"  Recently finished Abt for UTI (burning gone)  And placed on Amox today from PCP for symptoms.  No n/v/d

## 2023-08-04 NOTE — ED Provider Notes (Signed)
[place] EMERGENCY DEPARTMENT AT [facility] Provider Note   CSN: [id number] Arrival date & time: 08/04/23  1403    History  Chief Complaint  Patient presents with   Abdominal Pain    Sonya Brady is a 72 y.o. female here for evaluation of lower abdominal pain.  Intermittent, cramping in nature.  Located suprapubic region and left lower abdomen.  Recently completed Keflex for UTI.  At that time she was having dysuria and frequency which has resolved.  Over the last few days pain has changed.  She was placed on Augmentin today by her gastroenterologist Corinda Gubler) for possible diverticulitis.  Has history of similar.  Last bowel movement today.  No fever, nausea, vomiting, chest pain, back pain, dysuria, hematuria, blood in stool.  She is unsure of her last colonoscopy.  Prior hysterectomy  HPI     Home Medications Prior to Admission medications   Medication Sig Start Date End Date Taking? Authorizing Provider  ondansetron (ZOFRAN-ODT) 4 MG disintegrating tablet Take 1 tablet (4 mg total) by mouth every 8 (eight) hours as needed. 08/04/23  Yes Kaspian Muccio A, PA-C  oxyCODONE (ROXICODONE) 5 MG immediate release tablet Take 1 tablet (5 mg total) by mouth every 4 (four) hours as needed for severe pain (pain score 7-10). 08/04/23  Yes Alphons Burgert A, PA-C  albuterol (VENTOLIN HFA) 108 (90 Base) MCG/ACT inhaler Inhale 2 puffs into the lungs every 6 (six) hours as needed for wheezing or shortness of breath. Patient not taking: Reported on 05/10/2023 04/04/23   Corwin Levins, MD  amoxicillin-clavulanate (AUGMENTIN) 875-125 MG tablet Take 1 tablet by mouth 2 (two) times daily for 7 days. 08/04/23 08/11/23  Unk Lightning, PA  atorvastatin (LIPITOR) 80 MG tablet Take 1 tablet (80 mg total) by mouth daily. 04/17/23   Myrlene Broker, MD  Blood Glucose Monitoring Suppl [facility] VERIO) w/Device KIT Use As Directed 04/17/23   Myrlene Broker, MD  cephALEXin (KEFLEX)  500 MG capsule Take 1 capsule (500 mg total) by mouth 3 (three) times daily. 07/25/23   Corwin Levins, MD  dicyclomine (BENTYL) 10 MG capsule Take 20 mg every 4-6 hours as needed 10/06/22   Hilarie Fredrickson, MD  glucose blood [facility] VERIO) test strip USE 1 STRIP TO CHECK GLUCOSE ONCE DAILY 07/31/22   Reather Littler, MD  latanoprost (XALATAN) 0.005 % ophthalmic solution Place 1 drop into both eyes at bedtime.    [provider]  levothyroxine (SYNTHROID) 112 MCG tablet Take 1 tablet (112 mcg total) by mouth daily before breakfast. 04/17/23   Myrlene Broker, MD  lisinopril-hydrochlorothiazide (ZESTORETIC) 20-25 MG tablet Take 1 tablet by mouth daily. 04/17/23   Myrlene Broker, MD  Melatonin 10 MG TABS Take by mouth at bedtime as needed.    [provider]  metFORMIN (GLUCOPHAGE-XR) 500 MG 24 hr tablet Take 3 tablets (1,500 mg total) by mouth daily with breakfast. 05/16/23   Myrlene Broker, MD  montelukast (SINGULAIR) 10 MG tablet Take 10 mg by mouth at bedtime.    [provider]  Multiple Vitamins-Minerals (MULTIVITAMIN PO) Take by mouth. 2 chew tabs daily    [provider]  omeprazole (PRILOSEC) 20 MG capsule Take 1 capsule (20 mg total) by mouth daily. 05/10/23   Hilarie Fredrickson, MD  OneTouch Delica Lancets 30G MISC USE 1  TO CHECK GLUCOSE ONCE DAILY 07/12/22   Reather Littler, MD  timolol (BETIMOL) 0.5 % ophthalmic solution 1 drop 2 (  two) times daily.    [provider]  UNABLE TO FIND Clindomycin ear drop 4 drops 3 x week to left ear on Monday Wednesday and friday    [provider]      Allergies    Flagyl [metronidazole] and No known allergies    Review of Systems   Review of Systems  Constitutional: Negative.   HENT: Negative.    Respiratory: Negative.    Cardiovascular: Negative.   Gastrointestinal:  Positive for abdominal pain. Negative for abdominal distention, anal bleeding, blood in stool, constipation, diarrhea, nausea,  rectal pain and vomiting.  Genitourinary: Negative.   Musculoskeletal: Negative.   Skin: Negative.   Neurological: Negative.   All other systems reviewed and are negative.   Physical Exam Updated Vital Signs BP (!) 145/77 (BP Location: Right Arm)   Pulse (!) 106   Temp 98.3 F (36.8 C)   Resp 16   SpO2 98%  Physical Exam Vitals and nursing note reviewed.  Constitutional:      General: She is not in acute distress.    Appearance: She is well-developed. She is not ill-appearing, toxic-appearing or diaphoretic.  HENT:     Head: Atraumatic.  Eyes:     Pupils: Pupils are equal, round, and reactive to light.  Cardiovascular:     Rate and Rhythm: Normal rate.     Heart sounds: Normal heart sounds.  Pulmonary:     Effort: Pulmonary effort is normal. No respiratory distress.     Breath sounds: Normal breath sounds.  Abdominal:     General: Bowel sounds are normal. There is no distension.     Palpations: Abdomen is soft.     Tenderness: There is abdominal tenderness in the suprapubic area and left lower quadrant. There is no right CVA tenderness, left CVA tenderness, guarding or rebound. Negative signs include Murphy's sign and McBurney's sign.     Hernia: No hernia is present.  Musculoskeletal:        General: Normal range of motion.     Cervical back: Normal range of motion.  Skin:    General: Skin is warm and dry.  Neurological:     General: No focal deficit present.     Mental Status: She is alert.  Psychiatric:        Mood and Affect: Mood normal.    ED Results / Procedures / Treatments   Labs (all labs ordered are listed, but only abnormal results are displayed) Labs Reviewed  COMPREHENSIVE METABOLIC PANEL - Abnormal; Notable for the following components:      Result Value   Glucose, Bld 112 (*)    Calcium 10.5 (*)    Total Protein 8.6 (*)    All other components within normal limits  CBC - Abnormal; Notable for the following components:   WBC 11.5 (*)    All  other components within normal limits  URINALYSIS, ROUTINE W REFLEX MICROSCOPIC - Abnormal; Notable for the following components:   Color, Urine COLORLESS (*)    Leukocytes,Ua TRACE (*)    Bacteria, UA RARE (*)    All other components within normal limits  LIPASE, BLOOD    EKG None  Radiology CT ABDOMEN PELVIS W CONTRAST  Result Date: 08/04/2023 CLINICAL DATA:  Left lower quadrant abdominal pain, recent diagnosis of UTI EXAM: CT ABDOMEN AND PELVIS WITH CONTRAST TECHNIQUE: Multidetector CT imaging of the abdomen and pelvis was performed using the standard protocol following bolus administration of intravenous contrast. RADIATION DOSE REDUCTION: This  exam was performed according to the departmental dose-optimization program which includes automated exposure control, adjustment of the mA and/or kV according to patient size and/or use of iterative reconstruction technique. CONTRAST:  OMNIPAQUE IOHEXOL 300 MG/ML  SOLN COMPARISON:  09/30/2020 FINDINGS: Lower chest: No acute abnormality.  Coronary artery calcifications Hepatobiliary: No solid liver abnormality is seen. Gallstone. No gallbladder wall thickening, or biliary dilatation. Pancreas: Unremarkable. No pancreatic ductal dilatation or surrounding inflammatory changes. Spleen: Normal in size without significant abnormality. Adrenals/Urinary Tract: Adrenal glands are unremarkable. Simple, benign bilateral renal cortical cysts, for which no further follow-up or characterization is required. Kidneys are otherwise normal, without renal calculi, solid lesion, or hydronephrosis. Bladder is unremarkable. Stomach/Bowel: Stomach is within normal limits. Appendix appears normal. Pancolonic diverticulosis. Relatively focal circumferential wall thickening and adjacent fat stranding of the mid sigmoid colon, segment 5 cm in length (series 2, image 63). Vascular/Lymphatic: Aortic atherosclerosis. No enlarged abdominal or pelvic lymph nodes. Reproductive:  Status post hysterectomy. Other: Bilateral inguinal hernias.  No ascites. Musculoskeletal: No acute or significant osseous findings. IMPRESSION: 1. Pancolonic diverticulosis. Relatively focal circumferential wall thickening and adjacent fat stranding of the mid sigmoid colon, segment 5 cm in length, consistent with acute uncomplicated diverticulitis. Underlying mass not excluded. Consider colonoscopy at the resolution of acute clinical presentation to exclude underlying mass. 2. Cholelithiasis without evidence of acute cholecystitis. 3. Coronary artery disease. Aortic Atherosclerosis (ICD10-I70.0). Electronically Signed   By: Jearld Lesch M.D.   On: 08/04/2023 18:24    Procedures Procedures    Medications Ordered in ED Medications  fentaNYL (SUBLIMAZE) injection 25 mcg (25 mcg Intravenous Given 08/04/23 1643)  ondansetron (ZOFRAN) injection 4 mg (4 mg Intravenous Given 08/04/23 1642)  iohexol (OMNIPAQUE) 300 MG/ML solution 100 mL (100 mLs Intravenous Contrast Given 08/04/23 1653)    ED Course/ Medical Decision Making/ A&P   72 year old here for evaluation of abdominal pain.  Recently completed antibiotics for UTI, pain subsequently changed.  She called her gastroenterologist who called her in Augmentin for possible diverticulitis.  She is afebrile, nonseptic, not ill-appearing.  Her urinary symptoms have resolved.  She has pain to her suprapubic and left lower quadrant.  She has negative CVA tap.  She appears clinically well-hydrated.  She denies any change or blood in her stool.  Her heart and lungs are clear.  Plan on labs, imaging and reassess  Labs and imaging personally viewed and interpreted:  CBC leukocytosis 11.5 Metabolic panel glucose 112, calcium 10.5 Urine negative for UTI Lipase 43 CT abdomen pelvis shows UNcomplicated diverticulitis  Patient reassessed.  Discussed labs and imaging with patient, family in room.  She declines additional pain medication.  Will have her continue  on the Augmentin which she started today.  Will write for pain medication as well as Zofran. Tolerating PO intake. Will have her follow-up gastroenterology/PCP.  She will return for any new or worsening symptoms.  The patient has been appropriately medically screened and/or stabilized in the ED. I have low suspicion for any other emergent medical condition which would require further screening, evaluation or treatment in the ED or require inpatient management.  Patient is hemodynamically stable and in no acute distress.  Patient able to ambulate in department prior to ED.  Evaluation does not show acute pathology that would require ongoing or additional emergent interventions while in the emergency department or further inpatient treatment.  I have discussed the diagnosis with the patient and answered all questions.  Pain is been managed while in the emergency  department and patient has no further complaints prior to discharge.  Patient is comfortable with plan discussed in room and is stable for discharge at this time.  I have discussed strict return precautions for returning to the emergency department.  Patient was encouraged to follow-up with PCP/specialist refer to at discharge.                                  Medical Decision Making Amount and/or Complexity of Data Reviewed Independent Historian:     Details: Family in room External Data Reviewed: labs, radiology and notes. Labs: ordered. Decision-making details documented in ED Course. Radiology: ordered and independent interpretation performed. Decision-making details documented in ED Course.  Risk OTC drugs. Prescription drug management. Parenteral controlled substances. Decision regarding hospitalization. Diagnosis or treatment significantly limited by social determinants of health.         Final Clinical Impression(s) / ED Diagnoses Final diagnoses:  Diverticulitis    Rx / DC Orders ED Discharge Orders           Ordered    oxyCODONE (ROXICODONE) 5 MG immediate release tablet  Every 4 hours PRN        08/04/23 1848    ondansetron (ZOFRAN-ODT) 4 MG disintegrating tablet  Every 8 hours PRN        08/04/23 1848              Donyae Kohn A, PA-C 08/04/23 1851    Arby Barrette, MD 08/05/23 1503

## 2023-08-04 NOTE — Discharge Instructions (Addendum)
It was a pleasure take care of you here in the emergency department  Your CT scan showed diverticulitis. This is inflammation and infection in the colon.  Continue taking the antibiotics prescribed to you by the gastroenterologist called Augmentin until the antibiotics are completely done.  I have written you for some pain medication, oxycodone.  Please use caution as this medication can make you sleepy and off-balance.  You may also take additional Tylenol as this medication does NOT have Tylenol in it.  I have written you for Zofran which is a nausea medicine.  You place the tablet under your tongue for any nausea or vomiting.  The gastroenterologist should be calling you early next week to follow-up with you  Return to the emergency department for fever greater than 100.4, persistent vomiting, pain not controlled with medication

## 2023-08-04 NOTE — ED Notes (Signed)
Pts pain was starting to increase after the Fent... Pt asked if she wanted additional pain meds... Pt refused additional meds.

## 2023-08-04 NOTE — Telephone Encounter (Signed)
  Telephone call to on call service over the weekend 9:48 08/04/23  UTI 16th of this month- last abx for UTI today- no burn in urine-but a shooting lwr abdominal pain has developed that shoots into her left lower quadrant- felt like have a BM but this did not help- feels like diverticulitis symptoms that she has had before- started yesterday or day before.  Eating only small amounts over the past couple of days due to pain -trialed her dicyclomine 10 mg 2 last night- 1 tylenol/ 1 ibuprofen- pain awakening her at 3 am- some chills last night- this am has continued on Ibuprofen.  She feels like this is her diverticulitis.  Reviewed chart, has had recurrent diverticulitis last treated in December 2023 with Augmentin 875 mg twice daily for 7 days.  Went ahead and called this into the pharmacy for her.  Will have her nursing staff check on her on Monday, would like to get her into the clinic for follow-up shortly thereafter.  [name], PA-C

## 2023-08-04 NOTE — Telephone Encounter (Signed)
Noted. Thanks.

## 2023-08-04 NOTE — ED Notes (Signed)
Discharge paperwork given and verbally understood. 

## 2023-08-06 ENCOUNTER — Inpatient Hospital Stay (HOSPITAL_COMMUNITY)
Admission: EM | Admit: 2023-08-06 | Discharge: 2023-08-10 | DRG: 392 | Disposition: A | Discharge status: Home / Self Care | Payer: Medicare HMO | Attending: Internal Medicine | Admitting: Internal Medicine

## 2023-08-06 ENCOUNTER — Other Ambulatory Visit: Payer: Self-pay

## 2023-08-06 ENCOUNTER — Encounter (HOSPITAL_COMMUNITY): Payer: Self-pay

## 2023-08-06 ENCOUNTER — Emergency Department (HOSPITAL_COMMUNITY): Payer: Medicare HMO

## 2023-08-06 DIAGNOSIS — Z7984 Long term (current) use of oral hypoglycemic drugs: Secondary | ICD-10-CM | POA: Diagnosis not present

## 2023-08-06 DIAGNOSIS — I700 Atherosclerosis of aorta: Secondary | ICD-10-CM | POA: Diagnosis not present

## 2023-08-06 DIAGNOSIS — K8020 Calculus of gallbladder without cholecystitis without obstruction: Secondary | ICD-10-CM | POA: Diagnosis present

## 2023-08-06 DIAGNOSIS — E119 Type 2 diabetes mellitus without complications: Secondary | ICD-10-CM | POA: Diagnosis present

## 2023-08-06 DIAGNOSIS — Z8349 Family history of other endocrine, nutritional and metabolic diseases: Secondary | ICD-10-CM

## 2023-08-06 DIAGNOSIS — E7800 Pure hypercholesterolemia, unspecified: Secondary | ICD-10-CM | POA: Diagnosis not present

## 2023-08-06 DIAGNOSIS — Z8249 Family history of ischemic heart disease and other diseases of the circulatory system: Secondary | ICD-10-CM | POA: Diagnosis not present

## 2023-08-06 DIAGNOSIS — I10 Essential (primary) hypertension: Secondary | ICD-10-CM | POA: Diagnosis not present

## 2023-08-06 DIAGNOSIS — E871 Hypo-osmolality and hyponatremia: Secondary | ICD-10-CM | POA: Diagnosis present

## 2023-08-06 DIAGNOSIS — K219 Gastro-esophageal reflux disease without esophagitis: Secondary | ICD-10-CM | POA: Diagnosis present

## 2023-08-06 DIAGNOSIS — E039 Hypothyroidism, unspecified: Secondary | ICD-10-CM | POA: Diagnosis present

## 2023-08-06 DIAGNOSIS — Z8489 Family history of other specified conditions: Secondary | ICD-10-CM | POA: Diagnosis not present

## 2023-08-06 DIAGNOSIS — I2510 Atherosclerotic heart disease of native coronary artery without angina pectoris: Secondary | ICD-10-CM | POA: Diagnosis not present

## 2023-08-06 DIAGNOSIS — D649 Anemia, unspecified: Secondary | ICD-10-CM | POA: Diagnosis present

## 2023-08-06 DIAGNOSIS — F32A Depression, unspecified: Secondary | ICD-10-CM | POA: Diagnosis present

## 2023-08-06 DIAGNOSIS — Z888 Allergy status to other drugs, medicaments and biological substances status: Secondary | ICD-10-CM

## 2023-08-06 DIAGNOSIS — Z79890 Hormone replacement therapy (postmenopausal): Secondary | ICD-10-CM | POA: Diagnosis not present

## 2023-08-06 DIAGNOSIS — K5792 Diverticulitis of intestine, part unspecified, without perforation or abscess without bleeding: Secondary | ICD-10-CM | POA: Diagnosis not present

## 2023-08-06 DIAGNOSIS — K5732 Diverticulitis of large intestine without perforation or abscess without bleeding: Secondary | ICD-10-CM | POA: Diagnosis not present

## 2023-08-06 DIAGNOSIS — Z79899 Other long term (current) drug therapy: Secondary | ICD-10-CM

## 2023-08-06 DIAGNOSIS — Z8616 Personal history of COVID-19: Secondary | ICD-10-CM | POA: Diagnosis not present

## 2023-08-06 DIAGNOSIS — E876 Hypokalemia: Secondary | ICD-10-CM | POA: Diagnosis not present

## 2023-08-06 DIAGNOSIS — K5909 Other constipation: Secondary | ICD-10-CM | POA: Diagnosis not present

## 2023-08-06 DIAGNOSIS — K838 Other specified diseases of biliary tract: Secondary | ICD-10-CM | POA: Diagnosis not present

## 2023-08-06 DIAGNOSIS — R188 Other ascites: Secondary | ICD-10-CM | POA: Diagnosis not present

## 2023-08-06 DIAGNOSIS — Z90710 Acquired absence of both cervix and uterus: Secondary | ICD-10-CM

## 2023-08-06 DIAGNOSIS — K5733 Diverticulitis of large intestine without perforation or abscess with bleeding: Secondary | ICD-10-CM | POA: Diagnosis not present

## 2023-08-06 DIAGNOSIS — E860 Dehydration: Secondary | ICD-10-CM | POA: Diagnosis present

## 2023-08-06 LAB — URINALYSIS, ROUTINE W REFLEX MICROSCOPIC
Bacteria, UA: NONE SEEN
Bilirubin Urine: NEGATIVE
Glucose, UA: NEGATIVE mg/dL
Hgb urine dipstick: NEGATIVE
Ketones, ur: NEGATIVE mg/dL
Nitrite: NEGATIVE
Protein, ur: NEGATIVE mg/dL
Specific Gravity, Urine: 1.013 (ref 1.005–1.030)
pH: 7 (ref 5.0–8.0)

## 2023-08-06 LAB — CBC
HCT: 37.2 % (ref 36.0–46.0)
Hemoglobin: 12.3 g/dL (ref 12.0–15.0)
MCH: 29.6 pg (ref 26.0–34.0)
MCHC: 33.1 g/dL (ref 30.0–36.0)
MCV: 89.4 fL (ref 80.0–100.0)
Platelets: 272 10*3/uL (ref 150–400)
RBC: 4.16 MIL/uL (ref 3.87–5.11)
RDW: 12.5 % (ref 11.5–15.5)
WBC: 11.3 10*3/uL — ABNORMAL HIGH (ref 4.0–10.5)
nRBC: 0 % (ref 0.0–0.2)

## 2023-08-06 LAB — COMPREHENSIVE METABOLIC PANEL
ALT: 15 U/L (ref 0–44)
AST: 24 U/L (ref 15–41)
Albumin: 3.8 g/dL (ref 3.5–5.0)
Alkaline Phosphatase: 84 U/L (ref 38–126)
Anion gap: 14 (ref 5–15)
BUN: 13 mg/dL (ref 8–23)
CO2: 23 mmol/L (ref 22–32)
Calcium: 9.3 mg/dL (ref 8.9–10.3)
Chloride: 97 mmol/L — ABNORMAL LOW (ref 98–111)
Creatinine, Ser: 0.75 mg/dL (ref 0.44–1.00)
GFR, Estimated: >60 mL/min (ref >60)
Glucose, Bld: 117 mg/dL — ABNORMAL HIGH (ref 70–99)
Potassium: 3.7 mmol/L (ref 3.5–5.1)
Sodium: 134 mmol/L — ABNORMAL LOW (ref 135–145)
Total Bilirubin: 1 mg/dL (ref 0.3–1.2)
Total Protein: 7.8 g/dL (ref 6.5–8.1)

## 2023-08-06 LAB — LIPASE, BLOOD: Lipase: 27 U/L (ref 11–51)

## 2023-08-06 LAB — LACTIC ACID, PLASMA: Lactic Acid, Venous: 1 mmol/L (ref 0.5–1.9)

## 2023-08-06 MED ORDER — ONDANSETRON HCL 4 MG/2ML IJ SOLN
4.0000 mg | Freq: Once | INTRAMUSCULAR | Status: AC
Start: 2023-08-06 — End: 2023-08-06
  Administered 2023-08-06: 4 mg via INTRAVENOUS
  Filled 2023-08-06: qty 2

## 2023-08-06 MED ORDER — ACETAMINOPHEN 500 MG PO TABS
1000.0000 mg | ORAL_TABLET | Freq: Four times a day (QID) | ORAL | Status: DC
  Administered 2023-08-07 (×2): 1000 mg via ORAL
  Filled 2023-08-06 (×2): qty 2

## 2023-08-06 MED ORDER — HYDROMORPHONE HCL 1 MG/ML IJ SOLN
1.0000 mg | Freq: Once | INTRAMUSCULAR | Status: AC
  Administered 2023-08-06: 1 mg via INTRAVENOUS
  Filled 2023-08-06: qty 1

## 2023-08-06 MED ORDER — SODIUM CHLORIDE 0.9 % IV BOLUS
500.0000 mL | Freq: Once | INTRAVENOUS | Status: AC
  Administered 2023-08-06: 500 mL via INTRAVENOUS

## 2023-08-06 MED ORDER — OXYCODONE HCL 5 MG PO TABS
5.0000 mg | ORAL_TABLET | ORAL | Status: DC | PRN
  Administered 2023-08-07: 5 mg via ORAL
  Filled 2023-08-06: qty 1

## 2023-08-06 MED ORDER — IOHEXOL 300 MG/ML  SOLN
100.0000 mL | Freq: Once | INTRAMUSCULAR | Status: AC | PRN
  Administered 2023-08-06: 100 mL via INTRAVENOUS

## 2023-08-06 MED ORDER — HYDROMORPHONE HCL 1 MG/ML IJ SOLN
0.5000 mg | INTRAMUSCULAR | Status: DC | PRN
  Administered 2023-08-07 – 2023-08-08 (×2): 0.5 mg via INTRAVENOUS
  Filled 2023-08-06 (×2): qty 0.5

## 2023-08-06 MED ORDER — ENOXAPARIN SODIUM 40 MG/0.4ML IJ SOSY
40.0000 mg | PREFILLED_SYRINGE | INTRAMUSCULAR | Status: DC
  Administered 2023-08-07 – 2023-08-10 (×4): 40 mg via SUBCUTANEOUS
  Filled 2023-08-06 (×4): qty 0.4

## 2023-08-06 MED ORDER — OXYCODONE HCL 5 MG PO TABS
2.5000 mg | ORAL_TABLET | ORAL | Status: DC | PRN
  Administered 2023-08-07: 2.5 mg via ORAL
  Filled 2023-08-06: qty 1

## 2023-08-06 MED ORDER — HYDROMORPHONE HCL 1 MG/ML IJ SOLN
0.5000 mg | Freq: Once | INTRAMUSCULAR | Status: AC
  Administered 2023-08-06: 0.5 mg via INTRAVENOUS
  Filled 2023-08-06: qty 1

## 2023-08-06 MED ORDER — SODIUM CHLORIDE 0.9 % IV SOLN: INTRAVENOUS | Status: AC

## 2023-08-06 MED ORDER — PIPERACILLIN-TAZOBACTAM 3.375 G IVPB
3.3750 g | Freq: Three times a day (TID) | INTRAVENOUS | Status: DC
  Administered 2023-08-07 – 2023-08-10 (×11): 3.375 g via INTRAVENOUS
  Filled 2023-08-06 (×11): qty 50

## 2023-08-06 MED ORDER — ONDANSETRON HCL 4 MG/2ML IJ SOLN: 4.0000 mg | Freq: Four times a day (QID) | INTRAMUSCULAR | Status: DC | PRN

## 2023-08-06 NOTE — ED Provider Notes (Signed)
[place] EMERGENCY DEPARTMENT AT [facility] Provider Note   CSN: [id number] Arrival date & time: 08/06/23  1645     History  Chief Complaint  Patient presents with   Abdominal Pain   Emesis   Dizziness    Sonya Brady is a 72 y.o. female.  72 year old female presenting emergency department for evaluation of left lower quadrant abdominal pain.  Seen 2 days close emergency department diagnosed with diverticulitis.  Was discharged on Augmentin.  Took dose yesterday, today noted worsening pain, with nausea and vomiting.  Feels like she has to defecate.  No reported fevers or chills, chest pain.  Did note some lightheadedness.   Abdominal Pain Associated symptoms: vomiting   Emesis Associated symptoms: abdominal pain   Dizziness Associated symptoms: vomiting        Home Medications Prior to Admission medications   Medication Sig Start Date End Date Taking? Authorizing Provider  albuterol (VENTOLIN HFA) 108 (90 Base) MCG/ACT inhaler Inhale 2 puffs into the lungs every 6 (six) hours as needed for wheezing or shortness of breath. Patient not taking: Reported on 05/10/2023 04/04/23   Corwin Levins, MD  amoxicillin-clavulanate (AUGMENTIN) 875-125 MG tablet Take 1 tablet by mouth 2 (two) times daily for 7 days. 08/04/23 08/11/23  Unk Lightning, PA  atorvastatin (LIPITOR) 80 MG tablet Take 1 tablet (80 mg total) by mouth daily. 04/17/23   Myrlene Broker, MD  Blood Glucose Monitoring Suppl [facility] VERIO) w/Device KIT Use As Directed 04/17/23   Myrlene Broker, MD  cephALEXin (KEFLEX) 500 MG capsule Take 1 capsule (500 mg total) by mouth 3 (three) times daily. 07/25/23   Corwin Levins, MD  dicyclomine (BENTYL) 10 MG capsule Take 20 mg every 4-6 hours as needed 10/06/22   Hilarie Fredrickson, MD  glucose blood [facility] VERIO) test strip USE 1 STRIP TO CHECK GLUCOSE ONCE DAILY 07/31/22   Reather Littler, MD  latanoprost (XALATAN) 0.005 % ophthalmic solution Place 1  drop into both eyes at bedtime.    [provider]  levothyroxine (SYNTHROID) 112 MCG tablet Take 1 tablet (112 mcg total) by mouth daily before breakfast. 04/17/23   Myrlene Broker, MD  lisinopril-hydrochlorothiazide (ZESTORETIC) 20-25 MG tablet Take 1 tablet by mouth daily. 04/17/23   Myrlene Broker, MD  Melatonin 10 MG TABS Take by mouth at bedtime as needed.    [provider]  metFORMIN (GLUCOPHAGE-XR) 500 MG 24 hr tablet Take 3 tablets (1,500 mg total) by mouth daily with breakfast. 05/16/23   Myrlene Broker, MD  montelukast (SINGULAIR) 10 MG tablet Take 10 mg by mouth at bedtime.    [provider]  Multiple Vitamins-Minerals (MULTIVITAMIN PO) Take by mouth. 2 chew tabs daily    [provider]  omeprazole (PRILOSEC) 20 MG capsule Take 1 capsule (20 mg total) by mouth daily. 05/10/23   Hilarie Fredrickson, MD  ondansetron (ZOFRAN-ODT) 4 MG disintegrating tablet Take 1 tablet (4 mg total) by mouth every 8 (eight) hours as needed. 08/04/23   Henderly, Britni A, PA-C  OneTouch Delica Lancets 30G MISC USE 1  TO CHECK GLUCOSE ONCE DAILY 07/12/22   Reather Littler, MD  oxyCODONE (ROXICODONE) 5 MG immediate release tablet Take 1 tablet (5 mg total) by mouth every 4 (four) hours as needed for severe pain (pain score 7-10). 08/04/23   Henderly, Britni A, PA-C  timolol (BETIMOL) 0.5 % ophthalmic solution 1 drop 2 (two) times daily.    [provider]  UNABLE TO FIND Clindomycin ear drop 4 drops 3 x week to left ear on Monday Wednesday and friday    [provider]      Allergies    Flagyl [metronidazole] and No known allergies    Review of Systems   Review of Systems  Gastrointestinal:  Positive for abdominal pain and vomiting.  Neurological:  Positive for dizziness.    Physical Exam Updated Vital Signs BP 109/61   Pulse 84   Temp 99.1 F (37.3 C)   Resp 15   SpO2 97%  Physical Exam Vitals and nursing note reviewed.   Constitutional:      General: She is not in acute distress.    Appearance: She is not toxic-appearing.  HENT:     Head: Normocephalic.  Cardiovascular:     Rate and Rhythm: Normal rate and regular rhythm.  Abdominal:     Tenderness: There is abdominal tenderness (mild) in the periumbilical area, suprapubic area and left lower quadrant.  Skin:    General: Skin is warm and dry.     Capillary Refill: Capillary refill takes less than 2 seconds.  Neurological:     General: No focal deficit present.     Mental Status: She is alert.     ED Results / Procedures / Treatments   Labs (all labs ordered are listed, but only abnormal results are displayed) Labs Reviewed  COMPREHENSIVE METABOLIC PANEL - Abnormal; Notable for the following components:      Result Value   Sodium 134 (*)    Chloride 97 (*)    Glucose, Bld 117 (*)    All other components within normal limits  CBC - Abnormal; Notable for the following components:   WBC 11.3 (*)    All other components within normal limits  URINALYSIS, ROUTINE W REFLEX MICROSCOPIC - Abnormal; Notable for the following components:   Leukocytes,Ua TRACE (*)    All other components within normal limits  LIPASE, BLOOD  LACTIC ACID, PLASMA    EKG None  Radiology CT ABDOMEN PELVIS W CONTRAST  Result Date: 08/06/2023 CLINICAL DATA:  -abdominal pain, vomiting, dizziness EXAM: CT ABDOMEN AND PELVIS WITH CONTRAST TECHNIQUE: Multidetector CT imaging of the abdomen and pelvis was performed using the standard protocol following bolus administration of intravenous contrast. RADIATION DOSE REDUCTION: This exam was performed according to the departmental dose-optimization program which includes automated exposure control, adjustment of the mA and/or kV according to patient size and/or use of iterative reconstruction technique. CONTRAST:  OMNIPAQUE IOHEXOL 300 MG/ML  SOLN COMPARISON:  08/04/2023 FINDINGS: Lower chest: No acute pleural or parenchymal  lung disease. Hepatobiliary: Mild hepatic steatosis. No focal liver abnormality. Calcified gallstone and gallbladder sludge without evidence of acute cholecystitis. No biliary duct dilation. Pancreas: Unremarkable. No pancreatic ductal dilatation or surrounding inflammatory changes. Spleen: Normal in size without focal abnormality. Adrenals/Urinary Tract: Stable appearance of the kidneys without acute abnormality. The adrenals and bladder are unremarkable. Stomach/Bowel: Progressive segmental wall thickening and pericolonic fat stranding involving the mid sigmoid colon, consistent with worsening acute sigmoid diverticulitis. No evidence of perforation, fluid collection, or abscess. No bowel obstruction or ileus. Diverticulosis again noted throughout the remainder of the colon. Normal appendix right lower quadrant. Vascular/Lymphatic: Aortic atherosclerosis. No enlarged abdominal or pelvic lymph nodes. Reproductive: Status post hysterectomy. No adnexal masses. Other: Trace free fluid within the lower abdomen. No free intraperitoneal gas. Small fat containing bilateral inguinal hernias again noted. Musculoskeletal: No acute or destructive bony abnormalities. Chronic  changes of Paget's disease within the right iliac bone. Reconstructed images demonstrate no additional findings. IMPRESSION: 1. Worsening acute uncomplicated diverticulitis of the sigmoid colon. No perforation, fluid collection, or abscess. Follow-up recommended to document resolution and exclude underlying colonic mass. 2. Cholelithiasis without cholecystitis. 3.  Aortic Atherosclerosis (ICD10-I70.0). Electronically Signed   By: Sharlet Salina M.D.   On: 08/06/2023 22:30    Procedures Procedures    Medications Ordered in ED Medications  ondansetron (ZOFRAN) injection 4 mg (4 mg Intravenous Given 08/06/23 1819)  HYDROmorphone (DILAUDID) injection 0.5 mg (0.5 mg Intravenous Given 08/06/23 1820)  sodium chloride 0.9 % bolus 500 mL (0 mLs  Intravenous Stopped 08/06/23 1926)  iohexol (OMNIPAQUE) 300 MG/ML solution 100 mL (100 mLs Intravenous Contrast Given 08/06/23 1906)  HYDROmorphone (DILAUDID) injection 0.5 mg (0.5 mg Intravenous Given 08/06/23 1957)  HYDROmorphone (DILAUDID) injection 1 mg (1 mg Intravenous Given 08/06/23 2058)    ED Course/ Medical Decision Making/ A&P Clinical Course as of 08/06/23 2345  Mon Aug 06, 2023  1731 CT scan 08/04/23: "IMPRESSION: 1. Pancolonic diverticulosis. Relatively focal circumferential wall thickening and adjacent fat stranding of the mid sigmoid colon, segment 5 cm in length, consistent with acute uncomplicated diverticulitis. Underlying mass not excluded. Consider colonoscopy at the resolution of acute clinical presentation to exclude underlying mass. 2. Cholelithiasis without evidence of acute cholecystitis. 3. Coronary artery disease" [TY]  1804 Urinalysis, Routine w reflex microscopic -Urine, Clean Catch(!) Not consistent with UTI [TY]  2239 CT ABDOMEN PELVIS W CONTRAST IMPRESSION: 1. Worsening acute uncomplicated diverticulitis of the sigmoid colon. No perforation, fluid collection, or abscess. Follow-up recommended to document resolution and exclude underlying colonic mass. 2. Cholelithiasis without cholecystitis. 3.  Aortic Atherosclerosis (ICD10-I70.0).   Electronically Signed   [TY]    Clinical Course User Index [TY] Coral Spikes, DO                                 Medical Decision Making This is a 72 year old female history of diverticulitis presenting to the emergency department for left lower quadrant abdominal pain with recent diagnosis of diverticulitis.  She is afebrile nontachycardic hemodynamically stable.  Physical exam with mild left lower quadrant tenderness.  Her pain has seemingly worsened since discharge.  She is now with nauseated and vomiting.  Concern for possible diverticular complication.  Basic labs showed a leukocytosis, but appears to be  stable compared to prior.  No elevated lactate.  Low suspicion for systemic infection.  No significant metabolic derangements.  No hepatobiliary disease evident on her comprehensive panel either.  UA negative for UTI.  CT scan shows worsening diverticular disease, but no complications.  Patient has had to have numerous episodes of IV narcotics for pain control.  Given worsening symptoms with nausea vomiting with patient advanced age will admit for IV antibiotics and pain control.  Amount and/or Complexity of Data Reviewed Independent Historian:     Details: Daughter and son bedside helped provide history Labs: ordered. Decision-making details documented in ED Course. Radiology: ordered. Decision-making details documented in ED Course.  Risk Prescription drug management. Decision regarding hospitalization.          Final Clinical Impression(s) / ED Diagnoses Final diagnoses:  Diverticulitis    Rx / DC Orders ED Discharge Orders     None         Coral Spikes, DO 08/06/23 2345

## 2023-08-06 NOTE — Telephone Encounter (Signed)
Called patient to see how she is doing today since her call in over the weekend. Patient states she had gone to the ED at [facility] and was informed she has Diverticulitis. ABX's were prescribed and she also states she was told to take dulcolax as well. Patient states she is a little better, and was also prescribed an anti-emetic for nausea. Patient wants to be seen via Dr. Marina Goodell to discuss if surgery would be something to consider. Appt scheduled for the patient 10/12/2023. Patient satisfied.

## 2023-08-06 NOTE — ED Triage Notes (Addendum)
Patient presents with abdominal pain, vomiting in the am and dizziness since last Friday. She has also felt constipated since Saturday, stating she feels like there is a "blockage." She was seen at [facility] on Saturday. Symptoms did not improve so she came here. She believes symptoms may be due to diverticulitis.

## 2023-08-06 NOTE — Telephone Encounter (Signed)
Noted. Thanks.

## 2023-08-07 DIAGNOSIS — K5792 Diverticulitis of intestine, part unspecified, without perforation or abscess without bleeding: Secondary | ICD-10-CM

## 2023-08-07 DIAGNOSIS — K5732 Diverticulitis of large intestine without perforation or abscess without bleeding: Secondary | ICD-10-CM | POA: Diagnosis not present

## 2023-08-07 DIAGNOSIS — E860 Dehydration: Secondary | ICD-10-CM

## 2023-08-07 LAB — BASIC METABOLIC PANEL
Anion gap: 11 (ref 5–15)
BUN: 10 mg/dL (ref 8–23)
CO2: 22 mmol/L (ref 22–32)
Calcium: 8.9 mg/dL (ref 8.9–10.3)
Chloride: 101 mmol/L (ref 98–111)
Creatinine, Ser: 0.69 mg/dL (ref 0.44–1.00)
GFR, Estimated: >60 mL/min (ref >60)
Glucose, Bld: 109 mg/dL — ABNORMAL HIGH (ref 70–99)
Potassium: 3 mmol/L — ABNORMAL LOW (ref 3.5–5.1)
Sodium: 134 mmol/L — ABNORMAL LOW (ref 135–145)

## 2023-08-07 LAB — CBC
HCT: 33.8 % — ABNORMAL LOW (ref 36.0–46.0)
Hemoglobin: 11 g/dL — ABNORMAL LOW (ref 12.0–15.0)
MCH: 29.4 pg (ref 26.0–34.0)
MCHC: 32.5 g/dL (ref 30.0–36.0)
MCV: 90.4 fL (ref 80.0–100.0)
Platelets: 242 10*3/uL (ref 150–400)
RBC: 3.74 MIL/uL — ABNORMAL LOW (ref 3.87–5.11)
RDW: 12.4 % (ref 11.5–15.5)
WBC: 8.9 10*3/uL (ref 4.0–10.5)
nRBC: 0 % (ref 0.0–0.2)

## 2023-08-07 LAB — MAGNESIUM: Magnesium: 2 mg/dL (ref 1.7–2.4)

## 2023-08-07 LAB — GLUCOSE, CAPILLARY
Glucose-Capillary: 123 mg/dL — ABNORMAL HIGH (ref 70–99)
Glucose-Capillary: 124 mg/dL — ABNORMAL HIGH (ref 70–99)
Glucose-Capillary: 134 mg/dL — ABNORMAL HIGH (ref 70–99)
Glucose-Capillary: 114 mg/dL — ABNORMAL HIGH (ref 70–99)
Glucose-Capillary: 123 mg/dL — ABNORMAL HIGH (ref 70–99)

## 2023-08-07 LAB — PHOSPHORUS: Phosphorus: 3.6 mg/dL (ref 2.5–4.6)

## 2023-08-07 MED ORDER — DORZOLAMIDE HCL-TIMOLOL MAL 2-0.5 % OP SOLN
1.0000 [drp] | Freq: Two times a day (BID) | OPHTHALMIC | Status: DC
  Administered 2023-08-07 – 2023-08-10 (×8): 1 [drp] via OPHTHALMIC
  Filled 2023-08-07: qty 10

## 2023-08-07 MED ORDER — SENNOSIDES-DOCUSATE SODIUM 8.6-50 MG PO TABS
1.0000 | ORAL_TABLET | Freq: Two times a day (BID) | ORAL | Status: DC
  Administered 2023-08-07 – 2023-08-10 (×7): 1 via ORAL
  Filled 2023-08-07 (×7): qty 1

## 2023-08-07 MED ORDER — ACETAMINOPHEN 500 MG PO TABS
1000.0000 mg | ORAL_TABLET | Freq: Four times a day (QID) | ORAL | Status: DC
  Administered 2023-08-07 – 2023-08-10 (×11): 1000 mg via ORAL
  Filled 2023-08-07 (×12): qty 2

## 2023-08-07 MED ORDER — MONTELUKAST SODIUM 10 MG PO TABS
10.0000 mg | ORAL_TABLET | Freq: Every day | ORAL | Status: DC
  Administered 2023-08-07 – 2023-08-09 (×4): 10 mg via ORAL
  Filled 2023-08-07 (×4): qty 1

## 2023-08-07 MED ORDER — LATANOPROST 0.005 % OP SOLN
1.0000 [drp] | Freq: Every day | OPHTHALMIC | Status: DC
  Administered 2023-08-07 – 2023-08-09 (×4): 1 [drp] via OPHTHALMIC
  Filled 2023-08-07 (×2): qty 2.5

## 2023-08-07 MED ORDER — ACETAMINOPHEN 325 MG PO TABS: 650.0000 mg | ORAL_TABLET | ORAL | Status: DC | PRN

## 2023-08-07 MED ORDER — POTASSIUM CHLORIDE CRYS ER 20 MEQ PO TBCR
40.0000 meq | EXTENDED_RELEASE_TABLET | Freq: Once | ORAL | Status: AC
  Administered 2023-08-07: 40 meq via ORAL
  Filled 2023-08-07: qty 2

## 2023-08-07 MED ORDER — INSULIN ASPART 100 UNIT/ML IJ SOLN
0.0000 [IU] | Freq: Three times a day (TID) | INTRAMUSCULAR | Status: DC
  Administered 2023-08-09: 1 [IU] via SUBCUTANEOUS
  Filled 2023-08-07: qty 0.06

## 2023-08-07 MED ORDER — POLYETHYLENE GLYCOL 3350 17 G PO PACK
17.0000 g | PACK | Freq: Every day | ORAL | Status: DC
  Administered 2023-08-07 – 2023-08-10 (×4): 17 g via ORAL
  Filled 2023-08-07 (×4): qty 1

## 2023-08-07 MED ORDER — MELATONIN 5 MG PO TABS: 10.0000 mg | ORAL_TABLET | Freq: Every evening | ORAL | Status: DC | PRN

## 2023-08-07 MED ORDER — LEVOTHYROXINE SODIUM 112 MCG PO TABS
112.0000 ug | ORAL_TABLET | Freq: Every day | ORAL | Status: DC
  Administered 2023-08-07 – 2023-08-10 (×4): 112 ug via ORAL
  Filled 2023-08-07 (×4): qty 1

## 2023-08-07 MED ORDER — ATORVASTATIN CALCIUM 20 MG PO TABS
80.0000 mg | ORAL_TABLET | Freq: Every day | ORAL | Status: DC
  Administered 2023-08-07 – 2023-08-10 (×4): 80 mg via ORAL
  Filled 2023-08-07 (×4): qty 4

## 2023-08-07 MED ORDER — PANTOPRAZOLE SODIUM 40 MG PO TBEC
40.0000 mg | DELAYED_RELEASE_TABLET | Freq: Every day | ORAL | Status: DC
  Administered 2023-08-07 – 2023-08-10 (×4): 40 mg via ORAL
  Filled 2023-08-07 (×4): qty 1

## 2023-08-07 MED ORDER — BISACODYL 10 MG RE SUPP
10.0000 mg | Freq: Once | RECTAL | Status: AC
  Administered 2023-08-07: 10 mg via RECTAL
  Filled 2023-08-07: qty 1

## 2023-08-07 NOTE — Progress Notes (Signed)
PT Cancellation Note  Patient Details Name: Sonya Brady MRN: [id number] DOB: Apr 27, 1951   Cancelled Treatment:    Reason Eval/Treat Not Completed: Other (comment) Patient reports that  she is just eating a little and would walk later. Will check back another time.  Blanchard Kelch PT Acute Rehabilitation Services Office [phone] Weekend pager-[phone]   Rada Hay 08/07/2023, 2:15 PM

## 2023-08-07 NOTE — Consult Note (Signed)
Reason for Consult:recurrent diverticulitis Referring Physician: Dr. [name] is an 72 y.o. female.  HPI: This is a very pleasant 72 year old female with a long history of diverticulitis who was admitted with worsening left lower quadrant persistent abdominal pain with cramping and urgency.  She had been placed on oral Augmentin on 10/26 after CT scan showed diverticulitis.  Again, she has had multiple attacks throughout the years but has never had perforation or abscess.  Her medical history also includes diabetes, hypertension, hyperlipidemia, and hypothyroidism.  Her last colonoscopy was in 08/30/17.  She denies fevers or chills.  Since admission, she reports her abdominal pain has decreased significantly.  Past Medical History:  Diagnosis Date   Acne    Allergy    Arthritis    Carpal tunnel syndrome of left wrist 10/21/2021   Complication of anesthesia    could hear sounds with r breast biopsy yrs ago,   Depression    situational when husband was sick and dying.   Diabetes mellitus Type 2    Diverticulosis    GERD (gastroesophageal reflux disease)    Goiter    benign   right 5.7 cm/3.0x1.8cm left 5.2 x 2.4 x 2.1 cm per 05-09-2019 [place] epic no further follow up needed   H. pylori infection    History of COVID-19 08/31/2019   sick x 1 week all symptoms resolved   Hypercholesteremia    Hypertension    Hypothyroidism    Insomnia    PONV (postoperative nausea and vomiting)    x 1 yrs ago per pt on 10-24-2021   Tinnitus, left ear 10/24/2021   for last 30 yrs   Unspecified Eustachian tube disorder, left ear     Past Surgical History:  Procedure Laterality Date   ABDOMINAL HYSTERECTOMY     both ovary intact age 70   BREAST LUMPECTOMY Bilateral    benign mmore than 35 yrs ago   CARPAL TUNNEL RELEASE Left 10/27/2021   Procedure: CARPAL TUNNEL RELEASE;  Surgeon: Gomez Cleverly, MD;  Location: [facility] [place];  Service: Orthopedics;  Laterality: Left;   carpel tunnel  surgery Right    right 30 yrs ago per pt on 10-24-2021   CARPOMETACARPEL SUSPENSION PLASTY Left 10/27/2021   Procedure: left thumb carpometacarpal joint arthroplasty with tendon transfer;  Surgeon: Gomez Cleverly, MD;  Location: [facility];  Service: Orthopedics;  Laterality: Left;  with MAC   EXCISION MASS UPPER EXTREMETIES Right 06/05/2022   Procedure: EXCISION MASS RIGHT SHOULDER;  Surgeon: Luretha Murphy, MD;  Location: [facility];  Service: General;  Laterality: Right;   EYE SURGERY     cataracts yrs ago per pt on 10-24-2021   left ear eardrum reconstruction     ponv after 40 yrs ago   right lower abdominal mass benign removed  1978   TYMPANOPLASTY     tube left ear multiple times ober last 44 years   UPPER GI ENDOSCOPY     2013-08-30 and 08/30/17    Family History  Problem Relation Age of Onset   Other Father 80       deceased, unknown causes in 2023-08-31   Hypertension Mother    Other Mother 23       SBO, deceased   Thyroid disease Brother    Thyroid disease Sister    Breast cancer Neg Hx    Colon cancer Neg Hx    Esophageal cancer Neg Hx    Rectal cancer  Neg Hx    Stomach cancer Neg Hx    Diabetes Neg Hx     Social History:  reports that she has never smoked. She has never used smokeless tobacco. She reports current alcohol use. She reports that she does not use drugs.  Allergies:  Allergies  Allergen Reactions   Flagyl [Metronidazole]     Redness on arms   No Known Allergies     Medications: I have reviewed the patient's current medications.  Results for orders placed or performed during the hospital encounter of 08/06/23 (from the past 48 hour(s))  Urinalysis, Routine w reflex microscopic -Urine, Clean Catch     Status: Abnormal   Collection Time: 08/06/23  5:48 PM  Result Value Ref Range   Color, Urine YELLOW YELLOW   APPearance CLEAR CLEAR   Specific Gravity, Urine 1.013 1.005 - 1.030   pH 7.0 5.0 - 8.0   Glucose, UA NEGATIVE NEGATIVE  mg/dL   Hgb urine dipstick NEGATIVE NEGATIVE   Bilirubin Urine NEGATIVE NEGATIVE   Ketones, ur NEGATIVE NEGATIVE mg/dL   Protein, ur NEGATIVE NEGATIVE mg/dL   Nitrite NEGATIVE NEGATIVE   Leukocytes,Ua TRACE (A) NEGATIVE   RBC / HPF 0-5 0 - 5 RBC/hpf   WBC, UA 0-5 0 - 5 WBC/hpf   Bacteria, UA NONE SEEN NONE SEEN   Squamous Epithelial / HPF 0-5 0 - 5 /HPF   Mucus PRESENT    Hyaline Casts, UA PRESENT     Comment: Performed at [facility], [address], [place], [place] [address]  Lipase, blood     Status: None   Collection Time: 08/06/23  6:17 PM  Result Value Ref Range   Lipase 27 11 - 51 U/L    Comment: Performed at [facility], [address] [place], [place] [address]  Comprehensive metabolic panel     Status: Abnormal   Collection Time: 08/06/23  6:17 PM  Result Value Ref Range   Sodium 134 (L) 135 - 145 mmol/L   Potassium 3.7 3.5 - 5.1 mmol/L   Chloride 97 (L) 98 - 111 mmol/L   CO2 23 22 - 32 mmol/L   Glucose, Bld 117 (H) 70 - 99 mg/dL    Comment: Glucose reference range applies only to samples taken after fasting for at least 8 hours.   BUN 13 8 - 23 mg/dL   Creatinine, Ser 0.98 0.44 - 1.00 mg/dL   Calcium 9.3 8.9 - 11.9 mg/dL   Total Protein 7.8 6.5 - 8.1 g/dL   Albumin 3.8 3.5 - 5.0 g/dL   AST 24 15 - 41 U/L   ALT 15 0 - 44 U/L   Alkaline Phosphatase 84 38 - 126 U/L   Total Bilirubin 1.0 0.3 - 1.2 mg/dL   GFR, Estimated >14 >78 mL/min    Comment: (NOTE) Calculated using the CKD-EPI Creatinine Equation (2021)    Anion gap 14 5 - 15    Comment: Performed at [facility], [address], [place], [place] [address]  CBC     Status: Abnormal   Collection Time: 08/06/23  6:17 PM  Result Value Ref Range   WBC 11.3 (H) 4.0 - 10.5 K/uL   RBC 4.16 3.87 - 5.11 MIL/uL   Hemoglobin 12.3 12.0 - 15.0 g/dL   HCT 13.0 86.5 - 78.4 %   MCV 89.4 80.0 - 100.0 fL   MCH 29.6 26.0 - 34.0 pg   MCHC 33.1 30.0 - 36.0 g/dL  RDW 12.5 11.5 - 15.5 %   Platelets 272 150 - 400 K/uL   nRBC 0.0 0.0 - 0.2 %    Comment: Performed at [facility], [address] [place], [place] [address]  Lactic acid, plasma     Status: None   Collection Time: 08/06/23  6:24 PM  Result Value Ref Range   Lactic Acid, Venous 1.0 0.5 - 1.9 mmol/L    Comment: Performed at [facility], [address] [place], [place] [address]  Glucose, capillary     Status: Abnormal   Collection Time: 08/07/23 12:59 AM  Result Value Ref Range   Glucose-Capillary 123 (H) 70 - 99 mg/dL    Comment: Glucose reference range applies only to samples taken after fasting for at least 8 hours.  Basic metabolic panel     Status: Abnormal   Collection Time: 08/07/23  4:37 AM  Result Value Ref Range   Sodium 134 (L) 135 - 145 mmol/L   Potassium 3.0 (L) 3.5 - 5.1 mmol/L   Chloride 101 98 - 111 mmol/L   CO2 22 22 - 32 mmol/L   Glucose, Bld 109 (H) 70 - 99 mg/dL    Comment: Glucose reference range applies only to samples taken after fasting for at least 8 hours.   BUN 10 8 - 23 mg/dL   Creatinine, Ser 9.48 0.44 - 1.00 mg/dL   Calcium 8.9 8.9 - 54.6 mg/dL   GFR, Estimated >27 >03 mL/min    Comment: (NOTE) Calculated using the CKD-EPI Creatinine Equation (2021)    Anion gap 11 5 - 15    Comment: Performed at [facility], [address] [place], [place] [address]  CBC     Status: Abnormal   Collection Time: 08/07/23  4:37 AM  Result Value Ref Range   WBC 8.9 4.0 - 10.5 K/uL   RBC 3.74 (L) 3.87 - 5.11 MIL/uL   Hemoglobin 11.0 (L) 12.0 - 15.0 g/dL   HCT 81.8 (L) 29.9 - 37.1 %   MCV 90.4 80.0 - 100.0 fL   MCH 29.4 26.0 - 34.0 pg   MCHC 32.5 30.0 - 36.0 g/dL   RDW 69.6 78.9 - 38.1 %   Platelets 242 150 - 400 K/uL   nRBC 0.0 0.0 - 0.2 %    Comment: Performed at [facility], [address], [place], [place] [address]  Magnesium     Status: None   Collection Time: 08/07/23  4:37  AM  Result Value Ref Range   Magnesium 2.0 1.7 - 2.4 mg/dL    Comment: Performed at [facility], [address] [place], [place] [address]  Phosphorus     Status: None   Collection Time: 08/07/23  4:37 AM  Result Value Ref Range   Phosphorus 3.6 2.5 - 4.6 mg/dL    Comment: Performed at [facility], [address], [place], [place] [address]  Glucose, capillary     Status: Abnormal   Collection Time: 08/07/23  7:12 AM  Result Value Ref Range   Glucose-Capillary 124 (H) 70 - 99 mg/dL    Comment: Glucose reference range applies only to samples taken after fasting for at least 8 hours.    CT ABDOMEN PELVIS W CONTRAST  Result Date: 08/06/2023 CLINICAL DATA:  -abdominal pain, vomiting, dizziness EXAM: CT ABDOMEN AND PELVIS WITH CONTRAST TECHNIQUE: Multidetector CT imaging of the abdomen and pelvis was performed using the standard protocol following  bolus administration of intravenous contrast. RADIATION DOSE REDUCTION: This exam was performed according to the departmental dose-optimization program which includes automated exposure control, adjustment of the mA and/or kV according to patient size and/or use of iterative reconstruction technique. CONTRAST:  OMNIPAQUE IOHEXOL 300 MG/ML  SOLN COMPARISON:  08/04/2023 FINDINGS: Lower chest: No acute pleural or parenchymal lung disease. Hepatobiliary: Mild hepatic steatosis. No focal liver abnormality. Calcified gallstone and gallbladder sludge without evidence of acute cholecystitis. No biliary duct dilation. Pancreas: Unremarkable. No pancreatic ductal dilatation or surrounding inflammatory changes. Spleen: Normal in size without focal abnormality. Adrenals/Urinary Tract: Stable appearance of the kidneys without acute abnormality. The adrenals and bladder are unremarkable. Stomach/Bowel: Progressive segmental wall thickening and pericolonic fat stranding involving the mid sigmoid colon, consistent with worsening  acute sigmoid diverticulitis. No evidence of perforation, fluid collection, or abscess. No bowel obstruction or ileus. Diverticulosis again noted throughout the remainder of the colon. Normal appendix right lower quadrant. Vascular/Lymphatic: Aortic atherosclerosis. No enlarged abdominal or pelvic lymph nodes. Reproductive: Status post hysterectomy. No adnexal masses. Other: Trace free fluid within the lower abdomen. No free intraperitoneal gas. Small fat containing bilateral inguinal hernias again noted. Musculoskeletal: No acute or destructive bony abnormalities. Chronic changes of Paget's disease within the right iliac bone. Reconstructed images demonstrate no additional findings. IMPRESSION: 1. Worsening acute uncomplicated diverticulitis of the sigmoid colon. No perforation, fluid collection, or abscess. Follow-up recommended to document resolution and exclude underlying colonic mass. 2. Cholelithiasis without cholecystitis. 3.  Aortic Atherosclerosis (ICD10-I70.0). Electronically Signed   By: Sharlet Salina M.D.   On: 08/06/2023 22:30    Review of Systems  Constitutional:  Negative for chills and fever.  Respiratory:  Negative for shortness of breath.   Genitourinary:  Negative for dysuria.  All other systems reviewed and are negative.  Blood pressure 131/79, pulse 73, temperature (!) 97.5 F (36.4 C), temperature source Oral, resp. rate 17, SpO2 99%. Physical Exam Constitutional:      Appearance: She is well-developed. She is not ill-appearing.  HENT:     Head: Normocephalic and atraumatic.     Right Ear: Tympanic membrane normal.     Left Ear: Tympanic membrane normal.     Nose: Nose normal.  Eyes:     General: No scleral icterus. Cardiovascular:     Rate and Rhythm: Normal rate and regular rhythm.  Pulmonary:     Effort: Pulmonary effort is normal.     Breath sounds: Normal breath sounds.  Abdominal:     General: Abdomen is flat. There is no distension.     Palpations: Abdomen is  soft.     Comments: There is only mild left lower quadrant tenderness with minimal guarding  Skin:    General: Skin is warm and dry.  Neurological:     General: No focal deficit present.     Mental Status: She is alert. Mental status is at baseline.  Psychiatric:        Behavior: Behavior normal.        Judgment: Judgment normal.     Assessment/Plan: Recurrent sigmoid diverticulitis  I discussed the diagnosis with the patient.  I have reviewed her CT scan and laboratory data . She has had multiple attacks so interventional partial colectomy needs to be considered.  Hopefully she will improve this admission without the need for urgent surgery.  We can then arrange follow-up with our colorectal surgeons to consider a robotic partial colectomy electively.  She is currently on clear liquids and we  will continue to follow her during this admission.  Surgery be reconsidered if she acutely worsens.  IV antibiotics to be continued for now.  She agrees with the plan  Moderately complex medical decision making  Abigail Miyamoto MD 08/07/2023, 9:12 AM

## 2023-08-07 NOTE — H&P (Signed)
History and Physical    Sonya Brady BJY:[id number] DOB: [birth year] DOA: 08/06/2023  PCP: Myrlene Broker, MD   Patient coming from: Home   Chief Complaint:  Chief Complaint  Patient presents with   Abdominal Pain   Emesis   Dizziness    HPI:  Sonya Brady is a 72 y.o. female with hx of recent diagnosis of diverticulitis on 10/26 who had been on Augmentin therapy since this time, diabetes, hypertension, hyperlipidemia, hypothyroidism, abdominal surgeries including hysterectomy, right inguinal hernia repair, who presents with worsening lower abdominal pain and nausea/vomiting.  Reports that since starting on Augmentin has tolerated doses over the past 2 days, but has had persistent lower abdominal pain, cramping, fecal urgency, urinary urgency. Had tolerated limited PO liquids and broths over the weekend. On Monday morning when she woke up pain was severe, she tried to eat some cookies and had coffee but unable to keep this down despite taking antiemetics.   Ended up vomiting x 1, nonbloody, nonbilious.  No diarrhea, instead actually having constipation with last bowel movement yesterday.  Today also lightheaded and presyncopal while standing.  No syncope or fall.  Thinks she passed gas last in the afternoon today. Denies any fevers, chills. Due to her worsening pain and onset of vomiting she sought care in the ED.     Review of Systems:  ROS complete and negative except as marked above   Allergies  Allergen Reactions   Flagyl [Metronidazole]     Redness on arms   No Known Allergies     Prior to Admission medications   Medication Sig Start Date End Date Taking? Authorizing Provider  amoxicillin-clavulanate (AUGMENTIN) 875-125 MG tablet Take 1 tablet by mouth 2 (two) times daily for 7 days. 08/04/23 08/11/23 Yes Unk Lightning, PA  atorvastatin (LIPITOR) 80 MG tablet Take 1 tablet (80 mg total) by mouth daily. 04/17/23  Yes Myrlene Broker, MD   dorzolamide-timolol (COSOPT) 2-0.5 % ophthalmic solution Place 1 drop into both eyes 2 (two) times daily.   Yes [provider]  latanoprost (XALATAN) 0.005 % ophthalmic solution Place 1 drop into both eyes at bedtime.   Yes [provider]  levothyroxine (SYNTHROID) 112 MCG tablet Take 1 tablet (112 mcg total) by mouth daily before breakfast. 04/17/23  Yes Myrlene Broker, MD  lisinopril-hydrochlorothiazide (ZESTORETIC) 20-25 MG tablet Take 1 tablet by mouth daily. 04/17/23  Yes Myrlene Broker, MD  Melatonin 10 MG TABS Take 1 tablet by mouth at bedtime as needed.   Yes [provider]  metFORMIN (GLUCOPHAGE-XR) 500 MG 24 hr tablet Take 3 tablets (1,500 mg total) by mouth daily with breakfast. 05/16/23  Yes Myrlene Broker, MD  montelukast (SINGULAIR) 10 MG tablet Take 10 mg by mouth at bedtime.   Yes [provider]  Multiple Vitamins-Minerals (MULTIVITAMIN PO) Take 1 tablet by mouth daily.   Yes [provider]  omeprazole (PRILOSEC) 20 MG capsule Take 1 capsule (20 mg total) by mouth daily. 05/10/23  Yes Hilarie Fredrickson, MD  ondansetron (ZOFRAN-ODT) 4 MG disintegrating tablet Take 1 tablet (4 mg total) by mouth every 8 (eight) hours as needed. 08/04/23  Yes Henderly, Britni A, PA-C  oxyCODONE (ROXICODONE) 5 MG immediate release tablet Take 1 tablet (5 mg total) by mouth every 4 (four) hours as needed for severe pain (pain score 7-10). 08/04/23  Yes Henderly, Britni A, PA-C  UNABLE TO FIND Clindomycin ear drop 4 drops 3 x week to left  ear on Monday Wednesday and friday   Yes [provider]  albuterol (VENTOLIN HFA) 108 (90 Base) MCG/ACT inhaler Inhale 2 puffs into the lungs every 6 (six) hours as needed for wheezing or shortness of breath. Patient not taking: Reported on 08/06/2023 04/04/23   Corwin Levins, MD  Blood Glucose Monitoring Suppl [facility] VERIO) w/Device KIT Use As Directed 04/17/23   Myrlene Broker, MD  cephALEXin  (KEFLEX) 500 MG capsule Take 1 capsule (500 mg total) by mouth 3 (three) times daily. Patient not taking: Reported on 08/06/2023 07/25/23   Corwin Levins, MD  dicyclomine (BENTYL) 10 MG capsule Take 20 mg every 4-6 hours as needed Patient not taking: Reported on 08/06/2023 10/06/22   Hilarie Fredrickson, MD  glucose blood [facility] VERIO) test strip USE 1 STRIP TO CHECK GLUCOSE ONCE DAILY 07/31/22   Reather Littler, MD  OneTouch Delica Lancets 30G MISC USE 1  TO CHECK GLUCOSE ONCE DAILY 07/12/22   Reather Littler, MD    Past Medical History:  Diagnosis Date   Acne    Allergy    Arthritis    Carpal tunnel syndrome of left wrist 10/21/2021   Complication of anesthesia    could hear sounds with r breast biopsy yrs ago,   Depression    situational when husband was sick and dying.   Diabetes mellitus Type 2    Diverticulosis    GERD (gastroesophageal reflux disease)    Goiter    benign   right 5.7 cm/3.0x1.8cm left 5.2 x 2.4 x 2.1 cm per 05-09-2019 [place] epic no further follow up needed   H. pylori infection    History of COVID-19 2020   sick x 1 week all symptoms resolved   Hypercholesteremia    Hypertension    Hypothyroidism    Insomnia    PONV (postoperative nausea and vomiting)    x 1 yrs ago per pt on 10-24-2021   Tinnitus, left ear 10/24/2021   for last 30 yrs   Unspecified Eustachian tube disorder, left ear     Past Surgical History:  Procedure Laterality Date   ABDOMINAL HYSTERECTOMY     both ovary intact age 89   BREAST LUMPECTOMY Bilateral    benign mmore than 35 yrs ago   CARPAL TUNNEL RELEASE Left 10/27/2021   Procedure: CARPAL TUNNEL RELEASE;  Surgeon: Gomez Cleverly, MD;  Location: [facility] [place];  Service: Orthopedics;  Laterality: Left;   carpel tunnel surgery Right    right 30 yrs ago per pt on 10-24-2021   CARPOMETACARPEL SUSPENSION PLASTY Left 10/27/2021   Procedure: left thumb carpometacarpal joint arthroplasty with tendon transfer;  Surgeon: Gomez Cleverly, MD;   Location: [facility];  Service: Orthopedics;  Laterality: Left;  with MAC   EXCISION MASS UPPER EXTREMETIES Right 06/05/2022   Procedure: EXCISION MASS RIGHT SHOULDER;  Surgeon: Luretha Murphy, MD;  Location: [facility];  Service: General;  Laterality: Right;   EYE SURGERY     cataracts yrs ago per pt on 10-24-2021   left ear eardrum reconstruction     ponv after 40 yrs ago   right lower abdominal mass benign removed  1978   TYMPANOPLASTY     tube left ear multiple times ober last 44 years   UPPER GI ENDOSCOPY     2014 and 2018     reports that she has never smoked. She has never used smokeless tobacco. She reports current alcohol use. She reports that  she does not use drugs.  Family History  Problem Relation Age of Onset   Other Father 106       deceased, unknown causes in 08/23/23   Hypertension Mother    Other Mother 56       SBO, deceased   Thyroid disease Brother    Thyroid disease Sister    Breast cancer Neg Hx    Colon cancer Neg Hx    Esophageal cancer Neg Hx    Rectal cancer Neg Hx    Stomach cancer Neg Hx    Diabetes Neg Hx      Physical Exam: Vitals:   08/06/23 1830 08/06/23 1933 08/06/23 2203 08/07/23 0101  BP: 133/75 132/69 109/61 120/72  Pulse: 78 88 84 87  Resp:  13 15 15   Temp:  99.2 F (37.3 C) 99.1 F (37.3 C) 98.5 F (36.9 C)  TempSrc:  Oral  Oral  SpO2: 100% 98% 97% 100%    Gen: Awake, alert, NAD CV: Regular, normal S1, S2, 1/6 holosystolic murmur radiating towards the axilla Resp: Normal WOB, CTAB  Abd: Flat, nondistended, hypoactive, suprapubic and left lower quadrant tenderness.  No rebound, guarding, rigidity MSK: Symmetric, no edema  Skin: No rashes or lesions to exposed skin  Neuro: Alert and interactive  Psych: euthymic, appropriate    Data review:   Labs reviewed, notable for:   NA 134 Blood glucose 117 WBC 11 UA not consistent with infection.  Positive hyaline cast  Micro:  Results for orders  placed or performed in visit on 07/25/23  Urine Culture     Status: Abnormal   Collection Time: 07/25/23  8:55 AM   Specimen: Urine  Result Value Ref Range Status   Source: NOT GIVEN  Final   Status: FINAL  Final   Isolate 1: Escherichia coli (A)  Final    Comment: 10,000-49,000 CFU/mL of Escherichia coli      Susceptibility   Escherichia coli - URINE CULTURE, REFLEX    AMOX/CLAVULANIC <=2 Sensitive     AMPICILLIN <=2 Sensitive     AMPICILLIN/SULBACTAM <=2 Sensitive     CEFAZOLIN* <=4 Not Reportable      * For infections other than uncomplicated UTI caused by E. coli, K. pneumoniae or P. mirabilis: Cefazolin is resistant if MIC > or = 8 mcg/mL. (Distinguishing susceptible versus intermediate for isolates with MIC < or = 4 mcg/mL requires additional testing.) For uncomplicated UTI caused by E. coli, K. pneumoniae or P. mirabilis: Cefazolin is susceptible if MIC <32 mcg/mL and predicts susceptible to the oral agents cefaclor, cefdinir, cefpodoxime, cefprozil, cefuroxime, cephalexin and loracarbef.     CEFTAZIDIME <=1 Sensitive     CEFEPIME <=1 Sensitive     CEFTRIAXONE <=1 Sensitive     CIPROFLOXACIN <=0.25 Sensitive     LEVOFLOXACIN 1 Intermediate     GENTAMICIN <=1 Sensitive     IMIPENEM <=0.25 Sensitive     NITROFURANTOIN <=16 Sensitive     PIP/TAZO <=4 Sensitive     TOBRAMYCIN <=1 Sensitive     TRIMETH/SULFA* <=20 Sensitive      * For infections other than uncomplicated UTI caused by E. coli, K. pneumoniae or P. mirabilis: Cefazolin is resistant if MIC > or = 8 mcg/mL. (Distinguishing susceptible versus intermediate for isolates with MIC < or = 4 mcg/mL requires additional testing.) For uncomplicated UTI caused by E. coli, K. pneumoniae or P. mirabilis: Cefazolin is susceptible if MIC <32 mcg/mL and predicts susceptible to the oral agents cefaclor, cefdinir,  cefpodoxime, cefprozil, cefuroxime, cephalexin and loracarbef. Legend: S = Susceptible  I =  Intermediate R = Resistant  NS = Not susceptible SDD = Susceptible Dose Dependent * = Not Tested  NR = Not Reported **NN = See Therapy Comments     Imaging reviewed:  CT ABDOMEN PELVIS W CONTRAST  Result Date: 08/06/2023 CLINICAL DATA:  -abdominal pain, vomiting, dizziness EXAM: CT ABDOMEN AND PELVIS WITH CONTRAST TECHNIQUE: Multidetector CT imaging of the abdomen and pelvis was performed using the standard protocol following bolus administration of intravenous contrast. RADIATION DOSE REDUCTION: This exam was performed according to the departmental dose-optimization program which includes automated exposure control, adjustment of the mA and/or kV according to patient size and/or use of iterative reconstruction technique. CONTRAST:  OMNIPAQUE IOHEXOL 300 MG/ML  SOLN COMPARISON:  08/04/2023 FINDINGS: Lower chest: No acute pleural or parenchymal lung disease. Hepatobiliary: Mild hepatic steatosis. No focal liver abnormality. Calcified gallstone and gallbladder sludge without evidence of acute cholecystitis. No biliary duct dilation. Pancreas: Unremarkable. No pancreatic ductal dilatation or surrounding inflammatory changes. Spleen: Normal in size without focal abnormality. Adrenals/Urinary Tract: Stable appearance of the kidneys without acute abnormality. The adrenals and bladder are unremarkable. Stomach/Bowel: Progressive segmental wall thickening and pericolonic fat stranding involving the mid sigmoid colon, consistent with worsening acute sigmoid diverticulitis. No evidence of perforation, fluid collection, or abscess. No bowel obstruction or ileus. Diverticulosis again noted throughout the remainder of the colon. Normal appendix right lower quadrant. Vascular/Lymphatic: Aortic atherosclerosis. No enlarged abdominal or pelvic lymph nodes. Reproductive: Status post hysterectomy. No adnexal masses. Other: Trace free fluid within the lower abdomen. No free intraperitoneal gas. Small fat containing  bilateral inguinal hernias again noted. Musculoskeletal: No acute or destructive bony abnormalities. Chronic changes of Paget's disease within the right iliac bone. Reconstructed images demonstrate no additional findings. IMPRESSION: 1. Worsening acute uncomplicated diverticulitis of the sigmoid colon. No perforation, fluid collection, or abscess. Follow-up recommended to document resolution and exclude underlying colonic mass. 2. Cholelithiasis without cholecystitis. 3.  Aortic Atherosclerosis (ICD10-I70.0). Electronically Signed   By: Sharlet Salina M.D.   On: 08/06/2023 22:30     ED Course:  Treated with 500 cc IV fluid, Dilaudid, Zofran   Assessment/Plan:  72 y.o. female with hx recent diagnosis of diverticulitis on 10/26 who had been on Augmentin therapy since this time, diabetes, hypertension, hyperlipidemia, hypothyroidism, abdominal surgeries including hysterectomy, right inguinal hernia repair, who presents with worsening lower abdominal pain and nausea/vomiting.  Found to have radiographic features of worsening but uncomplicated diverticulitis.  Uncomplicated diverticulitis, progressed despite outpatient antibiotic therapy On Augmentin since 10/26.  Has developed mild constipation, monitor for obstipation.  WBC is 11.  CT abdomen pelvis with progressive segmental wall thickening and pericolonic stranding in the sigmoid consistent with worsening diverticulitis.  No associated complications. -Start Zosyn 3375 mg IV every 8 hours -N.p.o. with sips of clear liquids for now, advance diet as tolerates -NS at 75 cc/h overnight, then reevaluate need for IV fluids -Monitor for worsening obstipation considering her history of constipation currently.  If progressive would repeat abdominal imaging to rule out obstruction -Symptomatic management: Zofran as needed for nausea, Tylenol 1 g every 6 hour scheduled, oxycodone 2.5/5 mg p.o. every 4 hours as needed for moderate/severe, Dilaudid 0.5 mg IV every  4 hours for breakthrough -Will need follow-up colonoscopy as outpatient  Presyncope, volume depletion - IV fluids per above - Orthostatic in a.m. - PT evaluation  Chronic medical problems: Diabetes type 2: SSI while inpatient Hypertension: Hold  her home lisinopril/HCTZ in setting of volume depletion Hypothyroidism: Continue home levothyroxine HLD: Continue home statin GERD: Sub pantoprazole Systolic murmur: Should have TTE as outpatient to evaluate for valvular disease  Incidental findings: Aortic atherosclerosis Cholelithiasis Changes of Paget disease  There is no height or weight on file to calculate BMI.    DVT prophylaxis:  Lovenox Code Status:  Full Code Diet:  Diet Orders (From admission, onward)     Start     Ordered   08/06/23 2351  Diet NPO time specified Except for: Sips with Meds, Other (See Comments)  Diet effective now       Comments: Sips of clear liquids OK  Question Answer Comment  Except for Sips with Meds   Except for Other (See Comments)      08/06/23 2352           Family Communication:  No   Consults:  None   Admission status:   Inpatient, Med-Surg  Severity of Illness: The appropriate patient status for this patient is INPATIENT. Inpatient status is judged to be reasonable and necessary in order to provide the required intensity of service to ensure the patient's safety. The patient's presenting symptoms, physical exam findings, and initial radiographic and laboratory data in the context of their chronic comorbidities is felt to place them at high risk for further clinical deterioration. Furthermore, it is not anticipated that the patient will be medically stable for discharge from the hospital within 2 midnights of admission.   * I certify that at the point of admission it is my clinical judgment that the patient will require inpatient hospital care spanning beyond 2 midnights from the point of admission due to high intensity of service, high  risk for further deterioration and high frequency of surveillance required.*   Dolly Rias, MD [facility]  How to contact the [facility] Attending or Consulting provider 7A - 7P or covering provider during after hours 7P -7A, for this patient.  Check the care team in [facility] and look for a) attending/consulting [facility] provider listed and b) the [facility] team listed Log into [email or web address] and use [facility] universal password to access. If you do not have the password, please contact the hospital operator. Locate the [facility] provider you are looking for under [facility] and page to a number that you can be directly reached. If you still have difficulty reaching the provider, please page the [facility] (Director on Call) for the Hospitalists listed on amion for assistance.  08/07/2023, 5:24 AM

## 2023-08-07 NOTE — ED Notes (Signed)
ED TO INPATIENT HANDOFF REPORT  ED Nurse Name and Phone #: Rebecca Eaton RN  S Name/Age/Gender Sonya Brady 72 y.o. female Room/Bed: WA13/WA13  Code Status   Code Status: Full Code  Home/SNF/Other Home Patient oriented to: self, place, time, and situation Is this baseline? Yes   Triage Complete: Triage complete  Chief Complaint Sigmoid diverticulitis [K57.32]  Triage Note Patient presents with abdominal pain, vomiting in the am and dizziness since last Friday. She has also felt constipated since Saturday, stating she feels like there is a "blockage." She was seen at [facility] on Saturday. Symptoms did not improve so she came here. She believes symptoms may be due to diverticulitis.     Allergies Allergies  Allergen Reactions   Flagyl [Metronidazole]     Redness on arms   No Known Allergies     Level of Care/Admitting Diagnosis ED Disposition     ED Disposition  Admit   Condition  --   Comment  Hospital Area: [facility] [[id number]]  Level of Care: Med-Surg [16]  May admit patient to [facility] or [facility] if equivalent level of care is available:: No  Covid Evaluation: Asymptomatic - no recent exposure (last 10 days) testing not required  Diagnosis: Sigmoid diverticulitis [284132]  Admitting Physician: Dolly Rias [[id number]]  Attending Physician: Dolly Rias [[id number]]  Certification:: I certify this patient will need inpatient services for at least 2 midnights  Expected Medical Readiness: 08/08/2023          B Medical/Surgery History Past Medical History:  Diagnosis Date   Acne    Allergy    Arthritis    Carpal tunnel syndrome of left wrist 10/21/2021   Complication of anesthesia    could hear sounds with r breast biopsy yrs ago,   Depression    situational when husband was sick and dying.   Diabetes mellitus Type 2    Diverticulosis    GERD (gastroesophageal reflux disease)    Goiter    benign   right 5.7  cm/3.0x1.8cm left 5.2 x 2.4 x 2.1 cm per 05-09-2019 [place] epic no further follow up needed   H. pylori infection    History of COVID-19 2020   sick x 1 week all symptoms resolved   Hypercholesteremia    Hypertension    Hypothyroidism    Insomnia    PONV (postoperative nausea and vomiting)    x 1 yrs ago per pt on 10-24-2021   Tinnitus, left ear 10/24/2021   for last 30 yrs   Unspecified Eustachian tube disorder, left ear    Past Surgical History:  Procedure Laterality Date   ABDOMINAL HYSTERECTOMY     both ovary intact age 60   BREAST LUMPECTOMY Bilateral    benign mmore than 35 yrs ago   CARPAL TUNNEL RELEASE Left 10/27/2021   Procedure: CARPAL TUNNEL RELEASE;  Surgeon: Gomez Cleverly, MD;  Location: [facility];  Service: Orthopedics;  Laterality: Left;   carpel tunnel surgery Right    right 30 yrs ago per pt on 10-24-2021   CARPOMETACARPEL SUSPENSION PLASTY Left 10/27/2021   Procedure: left thumb carpometacarpal joint arthroplasty with tendon transfer;  Surgeon: Gomez Cleverly, MD;  Location: [facility];  Service: Orthopedics;  Laterality: Left;  with MAC   EXCISION MASS UPPER EXTREMETIES Right 06/05/2022   Procedure: EXCISION MASS RIGHT SHOULDER;  Surgeon: Luretha Murphy, MD;  Location: [facility];  Service: General;  Laterality: Right;   EYE SURGERY  cataracts yrs ago per pt on 10-24-2021   left ear eardrum reconstruction     ponv after 40 yrs ago   right lower abdominal mass benign removed  1978   TYMPANOPLASTY     tube left ear multiple times ober last 44 years   UPPER GI ENDOSCOPY     2014 and 2018     A IV Location/Drains/Wounds Patient Lines/Drains/Airways Status     Active Line/Drains/Airways     Name Placement date Placement time Site Days   Peripheral IV 08/06/23 20 G Left Antecubital 08/06/23  1819  Antecubital  1            Intake/Output Last 24 hours  Intake/Output Summary (Last 24 hours) at 08/07/2023  0030 Last data filed at 08/06/2023 1926 Gross per 24 hour  Intake 500 ml  Output --  Net 500 ml    Labs/Imaging Results for orders placed or performed during the hospital encounter of 08/06/23 (from the past 48 hour(s))  Urinalysis, Routine w reflex microscopic -Urine, Clean Catch     Status: Abnormal   Collection Time: 08/06/23  5:48 PM  Result Value Ref Range   Color, Urine YELLOW YELLOW   APPearance CLEAR CLEAR   Specific Gravity, Urine 1.013 1.005 - 1.030   pH 7.0 5.0 - 8.0   Glucose, UA NEGATIVE NEGATIVE mg/dL   Hgb urine dipstick NEGATIVE NEGATIVE   Bilirubin Urine NEGATIVE NEGATIVE   Ketones, ur NEGATIVE NEGATIVE mg/dL   Protein, ur NEGATIVE NEGATIVE mg/dL   Nitrite NEGATIVE NEGATIVE   Leukocytes,Ua TRACE (A) NEGATIVE   RBC / HPF 0-5 0 - 5 RBC/hpf   WBC, UA 0-5 0 - 5 WBC/hpf   Bacteria, UA NONE SEEN NONE SEEN   Squamous Epithelial / HPF 0-5 0 - 5 /HPF   Mucus PRESENT    Hyaline Casts, UA PRESENT     Comment: Performed at [facility], [address] [place], [place] [address]  Lipase, blood     Status: None   Collection Time: 08/06/23  6:17 PM  Result Value Ref Range   Lipase 27 11 - 51 U/L    Comment: Performed at [facility], [address], [place], [place] [address]  Comprehensive metabolic panel     Status: Abnormal   Collection Time: 08/06/23  6:17 PM  Result Value Ref Range   Sodium 134 (L) 135 - 145 mmol/L   Potassium 3.7 3.5 - 5.1 mmol/L   Chloride 97 (L) 98 - 111 mmol/L   CO2 23 22 - 32 mmol/L   Glucose, Bld 117 (H) 70 - 99 mg/dL    Comment: Glucose reference range applies only to samples taken after fasting for at least 8 hours.   BUN 13 8 - 23 mg/dL   Creatinine, Ser 0.98 0.44 - 1.00 mg/dL   Calcium 9.3 8.9 - 11.9 mg/dL   Total Protein 7.8 6.5 - 8.1 g/dL   Albumin 3.8 3.5 - 5.0 g/dL   AST 24 15 - 41 U/L   ALT 15 0 - 44 U/L   Alkaline Phosphatase 84 38 - 126 U/L   Total Bilirubin 1.0 0.3 - 1.2 mg/dL   GFR,  Estimated >14 >78 mL/min    Comment: (NOTE) Calculated using the CKD-EPI Creatinine Equation (2021)    Anion gap 14 5 - 15    Comment: Performed at [facility], [address] [place], [place] [address]  CBC     Status: Abnormal  Collection Time: 08/06/23  6:17 PM  Result Value Ref Range   WBC 11.3 (H) 4.0 - 10.5 K/uL   RBC 4.16 3.87 - 5.11 MIL/uL   Hemoglobin 12.3 12.0 - 15.0 g/dL   HCT 16.1 09.6 - 04.5 %   MCV 89.4 80.0 - 100.0 fL   MCH 29.6 26.0 - 34.0 pg   MCHC 33.1 30.0 - 36.0 g/dL   RDW 40.9 81.1 - 91.4 %   Platelets 272 150 - 400 K/uL   nRBC 0.0 0.0 - 0.2 %    Comment: Performed at [facility], [address], [place], [place] [address]  Lactic acid, plasma     Status: None   Collection Time: 08/06/23  6:24 PM  Result Value Ref Range   Lactic Acid, Venous 1.0 0.5 - 1.9 mmol/L    Comment: Performed at [facility], [address], [place], [place] [address]   CT ABDOMEN PELVIS W CONTRAST  Result Date: 08/06/2023 CLINICAL DATA:  -abdominal pain, vomiting, dizziness EXAM: CT ABDOMEN AND PELVIS WITH CONTRAST TECHNIQUE: Multidetector CT imaging of the abdomen and pelvis was performed using the standard protocol following bolus administration of intravenous contrast. RADIATION DOSE REDUCTION: This exam was performed according to the departmental dose-optimization program which includes automated exposure control, adjustment of the mA and/or kV according to patient size and/or use of iterative reconstruction technique. CONTRAST:  OMNIPAQUE IOHEXOL 300 MG/ML  SOLN COMPARISON:  08/04/2023 FINDINGS: Lower chest: No acute pleural or parenchymal lung disease. Hepatobiliary: Mild hepatic steatosis. No focal liver abnormality. Calcified gallstone and gallbladder sludge without evidence of acute cholecystitis. No biliary duct dilation. Pancreas: Unremarkable. No pancreatic ductal dilatation or surrounding inflammatory changes.  Spleen: Normal in size without focal abnormality. Adrenals/Urinary Tract: Stable appearance of the kidneys without acute abnormality. The adrenals and bladder are unremarkable. Stomach/Bowel: Progressive segmental wall thickening and pericolonic fat stranding involving the mid sigmoid colon, consistent with worsening acute sigmoid diverticulitis. No evidence of perforation, fluid collection, or abscess. No bowel obstruction or ileus. Diverticulosis again noted throughout the remainder of the colon. Normal appendix right lower quadrant. Vascular/Lymphatic: Aortic atherosclerosis. No enlarged abdominal or pelvic lymph nodes. Reproductive: Status post hysterectomy. No adnexal masses. Other: Trace free fluid within the lower abdomen. No free intraperitoneal gas. Small fat containing bilateral inguinal hernias again noted. Musculoskeletal: No acute or destructive bony abnormalities. Chronic changes of Paget's disease within the right iliac bone. Reconstructed images demonstrate no additional findings. IMPRESSION: 1. Worsening acute uncomplicated diverticulitis of the sigmoid colon. No perforation, fluid collection, or abscess. Follow-up recommended to document resolution and exclude underlying colonic mass. 2. Cholelithiasis without cholecystitis. 3.  Aortic Atherosclerosis (ICD10-I70.0). Electronically Signed   By: Sharlet Salina M.D.   On: 08/06/2023 22:30    Pending Labs Unresulted Labs (From admission, onward)     Start     Ordered   08/07/23 0500  Basic metabolic panel  Tomorrow morning,   R        08/06/23 2352   08/07/23 0500  CBC  Tomorrow morning,   R        08/06/23 2352   08/07/23 0500  Magnesium  Tomorrow morning,   R        08/06/23 2352   08/07/23 0500  Phosphorus  Tomorrow morning,   R        08/06/23 2352            Vitals/Pain Today's Vitals   08/06/23 1933 08/06/23 2027 08/06/23  2136 08/06/23 2203  BP: 132/69   109/61  Pulse: 88   84  Resp: 13   15  Temp: 99.2 F (37.3 C)    99.1 F (37.3 C)  TempSrc: Oral     SpO2: 98%   97%  PainSc:  7  3      Isolation Precautions No active isolations  Medications Medications  piperacillin-tazobactam (ZOSYN) IVPB 3.375 g (3.375 g Intravenous New Bag/Given 08/07/23 0009)  0.9 %  sodium chloride infusion ( Intravenous New Bag/Given 08/07/23 0008)  enoxaparin (LOVENOX) injection 40 mg (has no administration in time range)  acetaminophen (TYLENOL) tablet 1,000 mg (1,000 mg Oral Given 08/07/23 0010)  oxyCODONE (Oxy IR/ROXICODONE) immediate release tablet 2.5 mg (has no administration in time range)    Or  oxyCODONE (Oxy IR/ROXICODONE) immediate release tablet 5 mg (has no administration in time range)  HYDROmorphone (DILAUDID) injection 0.5 mg (has no administration in time range)  ondansetron (ZOFRAN) injection 4 mg (has no administration in time range)  atorvastatin (LIPITOR) tablet 80 mg (has no administration in time range)  levothyroxine (SYNTHROID) tablet 112 mcg (has no administration in time range)  pantoprazole (PROTONIX) EC tablet 40 mg (has no administration in time range)  melatonin tablet 10 mg (has no administration in time range)  montelukast (SINGULAIR) tablet 10 mg (has no administration in time range)  dorzolamide-timolol (COSOPT) 2-0.5 % ophthalmic solution 1 drop (has no administration in time range)  latanoprost (XALATAN) 0.005 % ophthalmic solution 1 drop (has no administration in time range)  insulin aspart (novoLOG) injection 0-6 Units (has no administration in time range)  ondansetron (ZOFRAN) injection 4 mg (4 mg Intravenous Given 08/06/23 1819)  HYDROmorphone (DILAUDID) injection 0.5 mg (0.5 mg Intravenous Given 08/06/23 1820)  sodium chloride 0.9 % bolus 500 mL (0 mLs Intravenous Stopped 08/06/23 1926)  iohexol (OMNIPAQUE) 300 MG/ML solution 100 mL (100 mLs Intravenous Contrast Given 08/06/23 1906)  HYDROmorphone (DILAUDID) injection 0.5 mg (0.5 mg Intravenous Given 08/06/23 1957)   HYDROmorphone (DILAUDID) injection 1 mg (1 mg Intravenous Given 08/06/23 2058)    Mobility walks     Focused Assessments     R Recommendations: See Admitting Provider Note  Report given to:   Additional Notes:

## 2023-08-07 NOTE — Progress Notes (Addendum)
Patient seen and examined, admitted by Dr. Lazarus Salines this morning  Briefly 72 year old female with history of long history of diverticulitis with recurrent episodes, diabetes, HTN, HLP, hypothyroidism, prior abdominal surgeries including hysterectomy, right inguinal hernia repair.  She had E. coli UTI on 10/16 and was placed on on cephalexin by PCP and completed the course on 10/26 but then was diagnosed with acute diverticulitis and placed on Augmentin outpatient by GI (Dr. Marina Goodell) on 10/26. Patient presented with worsening and persistent lower abdominal pain, cramping, fecal urgency, very limited p.o. intake with liquids and broth.  On Monday morning woke up with severe pain, nausea and vomiting and was unable to keep anything down despite taking antiemetics.  Also felt lightheaded.  BP 131/79 (BP Location: Right Arm)   Pulse 73   Temp (!) 97.5 F (36.4 C) (Oral)   Resp 17   SpO2 99%   Physical Exam General: Alert and oriented x 3, NAD Cardiovascular: S1 S2 clear, RRR.  Respiratory: CTAB, no wheezing, rales or rhonchi Gastrointestinal: Soft, mild LLQ TTP with minimal guarding, nondistended, NBS Ext: no pedal edema bilaterally Neuro: no new deficits Psych: Normal affect, pleasant  CT abdomen showed worsening acute uncomplicated diverticulitis of sigmoid colon, no perforation, fluid collection or abscess  Labs reviewed.  A/p  Acute recurrent sigmoid diverticulitis -Nausea vomiting, improving today, continue IV antiemetics as needed - continue IV fluids, IV Zosyn, pain control -Will advance diet to clear liquids today. -Requested general surgery consult, discussed with Dr. Magnus Ivan, patient has recurrent sigmoid diverticulitis requiring hospitalization and may be a candidate for resection/partial colectomy. -Patient has an appointment with Dr. Marina Goodell in 10/2023, however given recurrent episodes of diverticulitis, will benefit from repeat colonoscopy.  Last colonoscopy was in 2018 which showed  diverticulosis.  Will request GI for earlier appointment next month and hopefully consider repeat colonoscopy for surveillance or complications.  Near syncope, dehydration, mild hyponatremia -Sodium 134 -Due to poor p.o. intake and acute diverticulitis - continue IV fluid hydration  Hypokalemia -Replaced   Marque Rademaker M.D.  [facility] 08/07/2023, 11:48 AM

## 2023-08-07 NOTE — Consult Note (Addendum)
Consultation  Primary Care Physician:  Myrlene Broker, MD Primary Gastroenterologist:  Dr. Marina Goodell       Reason for Consultation: recurrent diverticulitis DOA: 08/06/2023         Hospital Day: 2         HPI:   Sonya Brady is a 72 y.o. female with past medical history significant for diabetes type 2, GERD complicated by peptic stricture, diverticulosis complicated by diverticulitis, hypertension, hypothyroidism, hysterectomy, and breast lumpectomy. Presents to the ER with worsening lower abdominal pain and nausea and vomiting. GI consulted for further evaluation.  Work up in ED notable for : Sodium 134, WBC 11, UA not consistent with infection Treated with 500 cc of IV fluid, Dilaudid, Zofran.  Imaging:  10/26 CT abdomen/pelvis-shows pan colonic diverticulosis with circumferential wall thickening consistent with acute uncomplicated diverticulitis.  Cholelithiasis without evidence of acute cholecystitis. 10/28 CT abdomen/pelvis-shows worsening acute uncomplicated diverticulitis of the sigmoid colon.  No perforation, fluid collection, or abscess.  Cholelithiasis without cholecystitis.  HPI: (10/26) Patient had called our office complaining of left lower quadrant pain with poor appetite and chills.Recently completed Keflex for UTI. Victorino Dike, PA-C sent in prescription for Augmentin 875 mg twice daily for 7 days.   Patient's symptoms worsened and she ended up going to ED 10/26.  Lab work at that time showed WBC 11.5.  Urine negative for UTI.  Lipase 43.  CT abdomen and pelvis showed uncomplicated diverticulitis.  Patient was discharged on the Augmentin as well as pain medication and Zofran.  Patient instructed to follow-up with gastroenterology/PCP.  Patient last seen in office by Dr. Marina Goodell on 05/10/2023 for annual visiting to discuss GERD complicated by peptic stricture, colon cancer screening, and a history of acute diverticulitis.  Patient at that time was asymptomatic.  Patient  taking omeprazole 20 mg p.o. daily for GERD.  She did have questions regarding diet and history of diverticulitis which was discussed.  Her last colonoscopy was performed April 2018 with recommendations for follow-up in 10 years.   Pt lying in bed, friend at bedside. Pt reports she was prescribed oral Augmentin on 10/26 for diverticulitis, but unfortunately after only a few doses she had episode of nausea & vomiting with worsening LLQ abdominal pain. Since admission she states she will have periods where her pain is 4/10 scale and other times where it is 7/10. She is tolerating clear liquid diet, but reports her appetite is poor. Her last BM was on Friday, reports she has had constipation for the past week. She reports feeling like " she is carrying a heaviness in her lower pelvic region that feels like being pregnant". She has chronic constipation but reports since starting Metformin she will have 2-3 BM's daily of small volume. No dysuria with urination. History of cystocele and rectocele that was evaluated at her last appt on 03/30/2023 that she would like [place] to note.   Previous GI workup:   08/04/2023 CT abdomen/pelvis IMPRESSION: 1. Pancolonic diverticulosis. Relatively focal circumferential wall thickening and adjacent fat stranding of the mid sigmoid colon, segment 5 cm in length, consistent with acute uncomplicated diverticulitis. Underlying mass not excluded. Consider colonoscopy at the resolution of acute clinical presentation to exclude underlying mass. 2. Cholelithiasis without evidence of acute cholecystitis. 3. Coronary artery disease. 08/06/2023 CT abdomen/pelvis IMPRESSION: 1. Worsening acute uncomplicated diverticulitis of the sigmoid colon. No perforation, fluid collection, or abscess. Follow-up recommended to document resolution and exclude underlying colonic mass. 2. Cholelithiasis without cholecystitis.  3.  Aortic Atherosclerosis (ICD10-I70.0).  01/23/2017 upper endoscopy  and colonoscopy  EGD- Dysphagia. History of esophageal stricture with previous dilation ( Dr. Jarold Motto)  Impression: - Benign- appearing esophageal stenosis. Dilated. - Normal stomach. - Normal examined duodenum. - No specimens collected.  Colonoscopy -Screening for colorectal malignant neoplasm. Negative index examination 2007 ( Dr. Jarold Motto)  Impression: - Two 1 to 2 mm polyps in the sigmoid colon and in the proximal transverse colon, removed with a cold snare. Resected and retrieved. - Diverticulosis in the left colon and in the right colon. - The examination was otherwise normal on direct and retroflexion views.  1. Surgical [P], transverse, polyp - BENIGN POLYPOID COLORECTAL MUCOSA. - THERE IS NO EVIDENCE OF MALIGNANCY. 2. Surgical [P], sigmoid, polyp - HYPERPLASTIC POLYP(S). - THERE IS NO EVIDENCE OF MALIGNANCY.   Past Medical History:  Diagnosis Date   Acne    Allergy    Arthritis    Carpal tunnel syndrome of left wrist 10/21/2021   Complication of anesthesia    could hear sounds with r breast biopsy yrs ago,   Depression    situational when husband was sick and dying.   Diabetes mellitus Type 2    Diverticulosis    GERD (gastroesophageal reflux disease)    Goiter    benign   right 5.7 cm/3.0x1.8cm left 5.2 x 2.4 x 2.1 cm per 05-09-2019 [place] epic no further follow up needed   H. pylori infection    History of COVID-19 2020   sick x 1 week all symptoms resolved   Hypercholesteremia    Hypertension    Hypothyroidism    Insomnia    PONV (postoperative nausea and vomiting)    x 1 yrs ago per pt on 10-24-2021   Tinnitus, left ear 10/24/2021   for last 30 yrs   Unspecified Eustachian tube disorder, left ear     Surgical History:  She  has a past surgical history that includes Tympanoplasty; Abdominal hysterectomy; Breast lumpectomy (Bilateral); carpel tunnel surgery (Right); Upper gi endoscopy; left ear eardrum reconstruction; right lower abdominal mass benign removed  (1978); Eye surgery; Carpometacarpel [facility]) suspension plasty (Left, 10/27/2021); Carpal tunnel release (Left, 10/27/2021); and Excision mass upper extremeties (Right, 06/05/2022). Family History:  Her family history includes Hypertension in her mother; Other (age of onset: 7) in her father; Other (age of onset: 36) in her mother; Thyroid disease in her brother and sister. Social History:   reports that she has never smoked. She has never used smokeless tobacco. She reports current alcohol use. She reports that she does not use drugs.  Prior to Admission medications   Medication Sig Start Date End Date Taking? Authorizing Provider  amoxicillin-clavulanate (AUGMENTIN) 875-125 MG tablet Take 1 tablet by mouth 2 (two) times daily for 7 days. 08/04/23 08/11/23 Yes Unk Lightning, PA  atorvastatin (LIPITOR) 80 MG tablet Take 1 tablet (80 mg total) by mouth daily. 04/17/23  Yes Myrlene Broker, MD  dorzolamide-timolol (COSOPT) 2-0.5 % ophthalmic solution Place 1 drop into both eyes 2 (two) times daily.   Yes [provider]  latanoprost (XALATAN) 0.005 % ophthalmic solution Place 1 drop into both eyes at bedtime.   Yes [provider]  levothyroxine (SYNTHROID) 112 MCG tablet Take 1 tablet (112 mcg total) by mouth daily before breakfast. 04/17/23  Yes Myrlene Broker, MD  lisinopril-hydrochlorothiazide (ZESTORETIC) 20-25 MG tablet Take 1 tablet by mouth daily. 04/17/23  Yes Myrlene Broker, MD  Melatonin 10 MG TABS Take  1 tablet by mouth at bedtime as needed.   Yes [provider]  metFORMIN (GLUCOPHAGE-XR) 500 MG 24 hr tablet Take 3 tablets (1,500 mg total) by mouth daily with breakfast. 05/16/23  Yes Myrlene Broker, MD  montelukast (SINGULAIR) 10 MG tablet Take 10 mg by mouth at bedtime.   Yes [provider]  Multiple Vitamins-Minerals (MULTIVITAMIN PO) Take 1 tablet by mouth daily.   Yes [provider]  omeprazole (PRILOSEC) 20 MG  capsule Take 1 capsule (20 mg total) by mouth daily. 05/10/23  Yes Hilarie Fredrickson, MD  ondansetron (ZOFRAN-ODT) 4 MG disintegrating tablet Take 1 tablet (4 mg total) by mouth every 8 (eight) hours as needed. 08/04/23  Yes Henderly, Britni A, PA-C  oxyCODONE (ROXICODONE) 5 MG immediate release tablet Take 1 tablet (5 mg total) by mouth every 4 (four) hours as needed for severe pain (pain score 7-10). 08/04/23  Yes Henderly, Britni A, PA-C  UNABLE TO FIND Clindomycin ear drop 4 drops 3 x week to left ear on Monday Wednesday and friday   Yes [provider]  albuterol (VENTOLIN HFA) 108 (90 Base) MCG/ACT inhaler Inhale 2 puffs into the lungs every 6 (six) hours as needed for wheezing or shortness of breath. Patient not taking: Reported on 08/06/2023 04/04/23   Corwin Levins, MD  Blood Glucose Monitoring Suppl [facility] VERIO) w/Device KIT Use As Directed 04/17/23   Myrlene Broker, MD  cephALEXin (KEFLEX) 500 MG capsule Take 1 capsule (500 mg total) by mouth 3 (three) times daily. Patient not taking: Reported on 08/06/2023 07/25/23   Corwin Levins, MD  dicyclomine (BENTYL) 10 MG capsule Take 20 mg every 4-6 hours as needed Patient not taking: Reported on 08/06/2023 10/06/22   Hilarie Fredrickson, MD  glucose blood [facility] VERIO) test strip USE 1 STRIP TO CHECK GLUCOSE ONCE DAILY 07/31/22   Reather Littler, MD  OneTouch Delica Lancets 30G MISC USE 1  TO CHECK GLUCOSE ONCE DAILY 07/12/22   Reather Littler, MD    Current Facility-Administered Medications  Medication Dose Route Frequency Provider Last Rate Last Admin   0.9 %  sodium chloride infusion   Intravenous Continuous Dolly Rias, MD 75 mL/hr at 08/07/23 0008 New Bag at 08/07/23 0008   acetaminophen (TYLENOL) tablet 1,000 mg  1,000 mg Oral Q6H Rai, Ripudeep K, MD       [START ON 08/10/2023] acetaminophen (TYLENOL) tablet 650 mg  650 mg Oral Q4H PRN Rai, Ripudeep K, MD       atorvastatin (LIPITOR) tablet 80 mg  80 mg Oral Daily Segars, Christiane Ha,  MD   80 mg at 08/07/23 0809   dorzolamide-timolol (COSOPT) 2-0.5 % ophthalmic solution 1 drop  1 drop Both Eyes BID Dolly Rias, MD   1 drop at 08/07/23 0836   enoxaparin (LOVENOX) injection 40 mg  40 mg Subcutaneous Q24H Dolly Rias, MD   40 mg at 08/07/23 0818   HYDROmorphone (DILAUDID) injection 0.5 mg  0.5 mg Intravenous Q4H PRN Dolly Rias, MD   0.5 mg at 08/07/23 0956   insulin aspart (novoLOG) injection 0-6 Units  0-6 Units Subcutaneous TID WC Segars, Christiane Ha, MD       latanoprost (XALATAN) 0.005 % ophthalmic solution 1 drop  1 drop Both Eyes QHS Dolly Rias, MD   1 drop at 08/07/23 0040   levothyroxine (SYNTHROID) tablet 112 mcg  112 mcg Oral Q0600 Dolly Rias, MD   112 mcg at 08/07/23 0451   melatonin tablet 10 mg  10 mg Oral QHS PRN Dolly Rias, MD       montelukast (SINGULAIR) tablet 10 mg  10 mg Oral QHS Dolly Rias, MD   10 mg at 08/07/23 0040   ondansetron (ZOFRAN) injection 4 mg  4 mg Intravenous Q6H PRN Dolly Rias, MD       oxyCODONE (Oxy IR/ROXICODONE) immediate release tablet 2.5 mg  2.5 mg Oral Q4H PRN Dolly Rias, MD   2.5 mg at 08/07/23 1610   Or   oxyCODONE (Oxy IR/ROXICODONE) immediate release tablet 5 mg  5 mg Oral Q4H PRN Dolly Rias, MD   5 mg at 08/07/23 0039   pantoprazole (PROTONIX) EC tablet 40 mg  40 mg Oral Daily Dolly Rias, MD   40 mg at 08/07/23 0809   piperacillin-tazobactam (ZOSYN) IVPB 3.375 g  3.375 g Intravenous Bebe Liter, MD 12.5 mL/hr at 08/07/23 0823 3.375 g at 08/07/23 9604   polyethylene glycol (MIRALAX / GLYCOLAX) packet 17 g  17 g Oral Daily Rai, Ripudeep K, MD   17 g at 08/07/23 0848   senna-docusate (Senokot-S) tablet 1 tablet  1 tablet Oral BID Rai, Ripudeep K, MD   1 tablet at 08/07/23 5409    Allergies as of 08/06/2023 - Review Complete 08/06/2023  Allergen Reaction Noted   Flagyl [metronidazole]  10/24/2021   No known allergies  07/08/2015    Review of Systems:     Constitutional: No weight loss, fever, chills, weakness or fatigue HEENT: Eyes: No change in vision               Ears, Nose, Throat:  No change in hearing or congestion Skin: No rash or itching Cardiovascular: No chest pain, chest pressure or palpitations   Respiratory: No SOB or cough Gastrointestinal: See HPI and otherwise negative Genitourinary: No dysuria or change in urinary frequency Neurological: No headache, dizziness or syncope Musculoskeletal: No new muscle or joint pain Hematologic: No bleeding or bruising Psychiatric: No history of depression or anxiety     Physical Exam:  Vital signs in last 24 hours: Temp:  [97.5 F (36.4 C)-99.2 F (37.3 C)] 97.5 F (36.4 C) (10/29 0716) Pulse Rate:  [72-88] 73 (10/29 0716) Resp:  [13-17] 17 (10/29 0716) BP: (109-145)/(61-86) 131/79 (10/29 0716) SpO2:  [97 %-100 %] 99 % (10/29 0716) Last BM Date : 08/05/23 Last BM recorded by nurses in past 5 days No data recorded  General:   Pleasant, well developed female in no acute distress Head:  Normocephalic and atraumatic. Eyes: sclerae anicteric,conjunctive  Heart:  regular rate and rhythm Pulm: Clear anteriorly; no wheezing Abdomen:  Soft, Non-distended AB, Hypoactive bowel sounds. moderate tenderness in the lower abdomen. Without guarding and Without rebound, No organomegaly appreciated. Extremities:  Without edema. Msk:  Symmetrical without gross deformities. Peripheral pulses intact.  Neurologic:  Alert and  oriented x4;  No focal deficits.  Skin:   Dry and intact without significant lesions or rashes. Psychiatric:  Cooperative. Normal mood and affect.  LAB RESULTS: Recent Labs    08/04/23 1422 08/06/23 1817 08/07/23 0437  WBC 11.5* 11.3* 8.9  HGB 13.4 12.3 11.0*  HCT 39.2 37.2 33.8*  PLT 261 272 242   BMET Recent Labs    08/04/23 1422 08/06/23 1817 08/07/23 0437  NA 135 134* 134*  K 4.0 3.7 3.0*  CL 98 97* 101  CO2 26 23 22   GLUCOSE 112* 117* 109*  BUN 13 13  10   CREATININE 0.67 0.75 0.69  CALCIUM 10.5* 9.3  8.9   LFT Recent Labs    08/06/23 1817  PROT 7.8  ALBUMIN 3.8  AST 24  ALT 15  ALKPHOS 84  BILITOT 1.0   PT/INR No results for input(s): "LABPROT", "INR" in the last 72 hours.  STUDIES: CT ABDOMEN PELVIS W CONTRAST  Result Date: 08/06/2023 CLINICAL DATA:  -abdominal pain, vomiting, dizziness EXAM: CT ABDOMEN AND PELVIS WITH CONTRAST TECHNIQUE: Multidetector CT imaging of the abdomen and pelvis was performed using the standard protocol following bolus administration of intravenous contrast. RADIATION DOSE REDUCTION: This exam was performed according to the departmental dose-optimization program which includes automated exposure control, adjustment of the mA and/or kV according to patient size and/or use of iterative reconstruction technique. CONTRAST:  OMNIPAQUE IOHEXOL 300 MG/ML  SOLN COMPARISON:  08/04/2023 FINDINGS: Lower chest: No acute pleural or parenchymal lung disease. Hepatobiliary: Mild hepatic steatosis. No focal liver abnormality. Calcified gallstone and gallbladder sludge without evidence of acute cholecystitis. No biliary duct dilation. Pancreas: Unremarkable. No pancreatic ductal dilatation or surrounding inflammatory changes. Spleen: Normal in size without focal abnormality. Adrenals/Urinary Tract: Stable appearance of the kidneys without acute abnormality. The adrenals and bladder are unremarkable. Stomach/Bowel: Progressive segmental wall thickening and pericolonic fat stranding involving the mid sigmoid colon, consistent with worsening acute sigmoid diverticulitis. No evidence of perforation, fluid collection, or abscess. No bowel obstruction or ileus. Diverticulosis again noted throughout the remainder of the colon. Normal appendix right lower quadrant. Vascular/Lymphatic: Aortic atherosclerosis. No enlarged abdominal or pelvic lymph nodes. Reproductive: Status post hysterectomy. No adnexal masses. Other: Trace free fluid  within the lower abdomen. No free intraperitoneal gas. Small fat containing bilateral inguinal hernias again noted. Musculoskeletal: No acute or destructive bony abnormalities. Chronic changes of Paget's disease within the right iliac bone. Reconstructed images demonstrate no additional findings. IMPRESSION: 1. Worsening acute uncomplicated diverticulitis of the sigmoid colon. No perforation, fluid collection, or abscess. Follow-up recommended to document resolution and exclude underlying colonic mass. 2. Cholelithiasis without cholecystitis. 3.  Aortic Atherosclerosis (ICD10-I70.0). Electronically Signed   By: Sharlet Salina M.D.   On: 08/06/2023 22:30      Impression /Plan:  72 year old female patient with recurrent sigmoid diverticulitis, unable to tolerate oral Augmentin with worsening abdominal pain with one episode of nausea and vomiting. Admitted for IV Antibiotics.  Uncomplicated recurrent sigmoid diverticulitis with abdominal pain, Nausea/vomiting, progressed despite outpatient antibiotic therapy (On Augmentin since 10/26) WBC 11.5-->11.3-->8.9 ,  HD stable BUN 0.69/10 10/26 CT abdomen/pelvis-shows pan colonic diverticulosis with circumferential wall thickening consistent with acute uncomplicated diverticulitis.  Cholelithiasis without evidence of acute cholecystitis. 10/28 CT abdomen/pelvis-shows worsening acute uncomplicated diverticulitis of the sigmoid colon.  No perforation, fluid collection, or abscess.  Cholelithiasis without cholecystitis. 01/23/2017 colonoscopy with 2 benign polyps and diverticulosis throughout left and right colon. -Continue Zosyn IV every 8 hrs -Continue CL diet, ADAT -pain management and antiemetics per hospitalist -Patient seen and evaluated by surgery, and plans to arrange follow-up outpatient to consider robotic partial colectomy. -Will schedule follow-up with our office in 3-4 weeks. -Will need follow-up colonoscopy as outpatient 6 to 8 weeks post resolution  of active diverticulitis.  Cholelithiasis without cholecystitis on Ct scan. Normal LFT's. WBC 8.9. No RUQ abdominal pain.  Chronic constipation -Continue Miralax po daily - Continue Senokot 1 tab BID  GERD -on home protonix  Acute anemia, Hgb 11, stable. No overt bleeding  Hypokalemia, today is 3. -replete per protocol  Hypothyroidism- last TSH 1.23 (5/24)  Principal Problem:   Sigmoid diverticulitis  LOS: 1 day   Thank you for your kind consultation, we will continue to follow.  Deanna J May  08/07/2023, 11:55 AM     [facility] Attending   I have taken an interval history, reviewed the chart and saw the patient. I agree with the Advanced Practitioner's note, impression and recommendations with the following additions:  She has acute diverticulitis failing outpatient management with oral antibiotics.  Agree with intravenous antibiotics.  It is sensible for her to have a colonoscopy prior to any elective resection which is what we are hoping for.  Should she not respond and require surgery sooner that would not be possible.  We are working on outpatient follow-up sometime around 6 weeks from now with plans to arrange colonoscopy after that.  Iva Boop, MD, [facility] See Loretha Stapler on call - gastroenterology for best contact person 08/07/2023 4:00 PM

## 2023-08-07 NOTE — Progress Notes (Signed)
PT Cancellation Note  Patient Details Name: Sonya Brady MRN: [id number] DOB: 02-25-1951   Cancelled Treatment:    Reason Eval/Treat Not Completed: Medical issues which prohibited therapy, reports having taken  medication and would like to postpone ambulation for later. PT will check back later.  Blanchard Kelch PT Acute Rehabilitation Services Office [phone] Weekend pager-[phone]    Rada Hay 08/07/2023, 9:19 AM

## 2023-08-08 ENCOUNTER — Telehealth: Payer: Self-pay

## 2023-08-08 ENCOUNTER — Ambulatory Visit: Payer: Medicare HMO | Admitting: Internal Medicine

## 2023-08-08 DIAGNOSIS — K5732 Diverticulitis of large intestine without perforation or abscess without bleeding: Secondary | ICD-10-CM | POA: Diagnosis not present

## 2023-08-08 LAB — CBC
HCT: 34.3 % — ABNORMAL LOW (ref 36.0–46.0)
Hemoglobin: 11.5 g/dL — ABNORMAL LOW (ref 12.0–15.0)
MCH: 29.7 pg (ref 26.0–34.0)
MCHC: 33.5 g/dL (ref 30.0–36.0)
MCV: 88.6 fL (ref 80.0–100.0)
Platelets: 252 10*3/uL (ref 150–400)
RBC: 3.87 MIL/uL (ref 3.87–5.11)
RDW: 12.5 % (ref 11.5–15.5)
WBC: 4.8 10*3/uL (ref 4.0–10.5)
nRBC: 0 % (ref 0.0–0.2)

## 2023-08-08 LAB — BASIC METABOLIC PANEL
Anion gap: 11 (ref 5–15)
BUN: 8 mg/dL (ref 8–23)
CO2: 22 mmol/L (ref 22–32)
Calcium: 9.1 mg/dL (ref 8.9–10.3)
Chloride: 104 mmol/L (ref 98–111)
Creatinine, Ser: 0.71 mg/dL (ref 0.44–1.00)
GFR, Estimated: >60 mL/min (ref >60)
Glucose, Bld: 105 mg/dL — ABNORMAL HIGH (ref 70–99)
Potassium: 3.3 mmol/L — ABNORMAL LOW (ref 3.5–5.1)
Sodium: 137 mmol/L (ref 135–145)

## 2023-08-08 LAB — GLUCOSE, CAPILLARY
Glucose-Capillary: 113 mg/dL — ABNORMAL HIGH (ref 70–99)
Glucose-Capillary: 139 mg/dL — ABNORMAL HIGH (ref 70–99)
Glucose-Capillary: 116 mg/dL — ABNORMAL HIGH (ref 70–99)
Glucose-Capillary: 138 mg/dL — ABNORMAL HIGH (ref 70–99)

## 2023-08-08 MED ORDER — SODIUM CHLORIDE 0.9 % IV SOLN: INTRAVENOUS | Status: AC

## 2023-08-08 MED ORDER — ENSURE ENLIVE PO LIQD
237.0000 mL | Freq: Two times a day (BID) | ORAL | Status: DC
  Administered 2023-08-08 (×2): 237 mL via ORAL

## 2023-08-08 NOTE — Telephone Encounter (Signed)
Pt scheduled to see [name] PA 08/31/23 at 11:30am. Appt letter mailed to pt.

## 2023-08-08 NOTE — Progress Notes (Signed)
   08/08/23 1246  TOC Brief Assessment  Insurance and Status Reviewed  Patient has primary care physician Yes  Home environment has been reviewed Resides with children  Prior level of function: Independent at baseline  Prior/Current Home Services No current home services  Social Determinants of Health Reivew SDOH reviewed no interventions necessary  Readmission risk has been reviewed Yes  Transition of care needs no transition of care needs at this time

## 2023-08-08 NOTE — Plan of Care (Signed)

## 2023-08-08 NOTE — Telephone Encounter (Signed)
-----   Message from Margarite Gouge May sent at 08/07/2023  1:07 PM EDT ----- Regarding: Follow-up after hospitalization [facility]t afternoon, this is a patient of Dr. Lamar Sprinkles who needs a follow-up after recent hospitalization for diverticulitis the end of November with him or APP.  Thank you, Deanna & Fredric Mare

## 2023-08-08 NOTE — Progress Notes (Signed)
PROGRESS NOTE    Sonya Brady  ZOX:[id number] DOB: January 18, 1951 DOA: 08/06/2023 PCP: Myrlene Broker, MD    Brief Narrative:  72 year old with history of hypertension, type 2 diabetes, hyperlipidemia, hypothyroidism, recurrent sigmoid diverticulitis who was on oral Augmentin therapy with symptomatic for last 2 days had persistent lower abdominal pain cramping, fecal urgency and urinary urgency so was brought to the emergency room.  She was found to have acute sigmoid diverticulitis without perforation.  Admitted due to symptomatic diverticulitis.  Subjective: Patient seen early morning.  She was comfortable walking around in the room.  Patient tells me that she did only try some liquids and small amount of juice.  She did have episodic pain and used injection Dilaudid early morning.  Otherwise she is managing on oxycodone, total 2 doses overnight.  She had a small amount of bowel movement.  Denies any nausea.   Assessment & Plan:   Acute recurrent sigmoid diverticulitis, failed outpatient therapy. Still symptomatic. Continue maintenance IV fluid.  Currently on clears, will advance to full liquid diet and gradually to soft diet. Adequate pain medications.  Mobility. Stool softener and laxative regimen. Followed by GI and surgery,  Plan is to stabilize her, able to eat and pain controlled and go home.  Interval colonoscopy and subsequent sigmoidectomy.  GI and surgery will schedule outpatient follow-up.  Hypokalemia: Will replace and monitor levels.  Chronic medical issues  Type 2 diabetes: On oral hypoglycemics at home.  Keep on SSI. Essential hypertension: Holding blood pressure medications. Hypothyroidism: On levothyroxine.  Continued. Hyperlipidemia: On statin. GERD: On PPI     DVT prophylaxis: enoxaparin (LOVENOX) injection 40 mg Start: 08/07/23 1000   Code Status: Full code Family Communication: Son over the phone Disposition Plan: Status is: Inpatient Remains  inpatient appropriate because: Oral intake challenge, IV fluids     Consultants:  Gastroenterology General surgery  Procedures:  None  Antimicrobials:  Zosyn 10/28----     Objective: Vitals:   08/07/23 1334 08/07/23 2035 08/08/23 0627 08/08/23 0900  BP: 112/65 130/77 133/77   Pulse: 66 75 73   Resp: 16 15 15    Temp: 98 F (36.7 C) 98.1 F (36.7 C) 97.7 F (36.5 C)   TempSrc: Oral Oral Oral   SpO2: 97% 95% 97%   Weight:    74.4 kg  Height:    5\' 6"  (1.676 m)    Intake/Output Summary (Last 24 hours) at 08/08/2023 1111 Last data filed at 08/08/2023 1000 Gross per 24 hour  Intake 1029.31 ml  Output 1425 ml  Net -395.69 ml   Filed Weights   08/08/23 0900  Weight: 74.4 kg    Examination:  General exam: Appears calm and comfortable  Pleasant to interaction.  On room air. Respiratory system: Clear to auscultation. Respiratory effort normal. Cardiovascular system: S1 & S2 heard, RRR. No JVD, murmurs, rubs, gallops or clicks. No pedal edema. Gastrointestinal system: Soft.  Nontender.  Bowel sounds present.   Central nervous system: Alert and oriented. No focal neurological deficits. Extremities: Symmetric 5 x 5 power. Skin: No rashes, lesions or ulcers Psychiatry: Judgement and insight appear normal. Mood & affect appropriate.     Data Reviewed: I have personally reviewed following labs and imaging studies  CBC: Recent Labs  Lab 08/04/23 1422 08/06/23 1817 08/07/23 0437 08/08/23 0511  WBC 11.5* 11.3* 8.9 4.8  HGB 13.4 12.3 11.0* 11.5*  HCT 39.2 37.2 33.8* 34.3*  MCV 87.7 89.4 90.4 88.6  PLT 261 272 242 252  Basic Metabolic Panel: Recent Labs  Lab 08/04/23 1422 08/06/23 1817 08/07/23 0437 08/08/23 0511  NA 135 134* 134* 137  K 4.0 3.7 3.0* 3.3*  CL 98 97* 101 104  CO2 26 23 22 22   GLUCOSE 112* 117* 109* 105*  BUN 13 13 10 8   CREATININE 0.67 0.75 0.69 0.71  CALCIUM 10.5* 9.3 8.9 9.1  MG  --   --  2.0  --   PHOS  --   --  3.6  --     GFR: Estimated Creatinine Clearance: 64.6 mL/min (by C-G formula based on SCr of 0.71 mg/dL). Liver Function Tests: Recent Labs  Lab 08/04/23 1422 08/06/23 1817  AST 16 24  ALT 15 15  ALKPHOS 84 84  BILITOT 1.0 1.0  PROT 8.6* 7.8  ALBUMIN 4.6 3.8   Recent Labs  Lab 08/04/23 1422 08/06/23 1817  LIPASE 43 27   No results for input(s): "AMMONIA" in the last 168 hours. Coagulation Profile: No results for input(s): "INR", "PROTIME" in the last 168 hours. Cardiac Enzymes: No results for input(s): "CKTOTAL", "CKMB", "CKMBINDEX", "TROPONINI" in the last 168 hours. BNP (last 3 results) No results for input(s): "PROBNP" in the last 8760 hours. HbA1C: No results for input(s): "HGBA1C" in the last 72 hours. CBG: Recent Labs  Lab 08/07/23 0712 08/07/23 1126 08/07/23 1629 08/07/23 2033 08/08/23 0752  GLUCAP 124* 134* 114* 123* 113*   Lipid Profile: No results for input(s): "CHOL", "HDL", "LDLCALC", "TRIG", "CHOLHDL", "LDLDIRECT" in the last 72 hours. Thyroid Function Tests: No results for input(s): "TSH", "T4TOTAL", "FREET4", "T3FREE", "THYROIDAB" in the last 72 hours. Anemia Panel: No results for input(s): "VITAMINB12", "FOLATE", "FERRITIN", "TIBC", "IRON", "RETICCTPCT" in the last 72 hours. Sepsis Labs: Recent Labs  Lab 08/06/23 1824  LATICACIDVEN 1.0    No results found for this or any previous visit (from the past 240 hour(s)).       Radiology Studies: CT ABDOMEN PELVIS W CONTRAST  Result Date: 08/06/2023 CLINICAL DATA:  -abdominal pain, vomiting, dizziness EXAM: CT ABDOMEN AND PELVIS WITH CONTRAST TECHNIQUE: Multidetector CT imaging of the abdomen and pelvis was performed using the standard protocol following bolus administration of intravenous contrast. RADIATION DOSE REDUCTION: This exam was performed according to the departmental dose-optimization program which includes automated exposure control, adjustment of the mA and/or kV according to patient size  and/or use of iterative reconstruction technique. CONTRAST:  OMNIPAQUE IOHEXOL 300 MG/ML  SOLN COMPARISON:  08/04/2023 FINDINGS: Lower chest: No acute pleural or parenchymal lung disease. Hepatobiliary: Mild hepatic steatosis. No focal liver abnormality. Calcified gallstone and gallbladder sludge without evidence of acute cholecystitis. No biliary duct dilation. Pancreas: Unremarkable. No pancreatic ductal dilatation or surrounding inflammatory changes. Spleen: Normal in size without focal abnormality. Adrenals/Urinary Tract: Stable appearance of the kidneys without acute abnormality. The adrenals and bladder are unremarkable. Stomach/Bowel: Progressive segmental wall thickening and pericolonic fat stranding involving the mid sigmoid colon, consistent with worsening acute sigmoid diverticulitis. No evidence of perforation, fluid collection, or abscess. No bowel obstruction or ileus. Diverticulosis again noted throughout the remainder of the colon. Normal appendix right lower quadrant. Vascular/Lymphatic: Aortic atherosclerosis. No enlarged abdominal or pelvic lymph nodes. Reproductive: Status post hysterectomy. No adnexal masses. Other: Trace free fluid within the lower abdomen. No free intraperitoneal gas. Small fat containing bilateral inguinal hernias again noted. Musculoskeletal: No acute or destructive bony abnormalities. Chronic changes of Paget's disease within the right iliac bone. Reconstructed images demonstrate no additional findings. IMPRESSION: 1. Worsening acute uncomplicated diverticulitis of  the sigmoid colon. No perforation, fluid collection, or abscess. Follow-up recommended to document resolution and exclude underlying colonic mass. 2. Cholelithiasis without cholecystitis. 3.  Aortic Atherosclerosis (ICD10-I70.0). Electronically Signed   By: Sharlet Salina M.D.   On: 08/06/2023 22:30        Scheduled Meds:  acetaminophen  1,000 mg Oral Q6H   atorvastatin  80 mg Oral Daily    dorzolamide-timolol  1 drop Both Eyes BID   enoxaparin (LOVENOX) injection  40 mg Subcutaneous Q24H   feeding supplement  237 mL Oral BID BM   insulin aspart  0-6 Units Subcutaneous TID WC   latanoprost  1 drop Both Eyes QHS   levothyroxine  112 mcg Oral Q0600   montelukast  10 mg Oral QHS   pantoprazole  40 mg Oral Daily   polyethylene glycol  17 g Oral Daily   senna-docusate  1 tablet Oral BID   Continuous Infusions:  sodium chloride     piperacillin-tazobactam (ZOSYN)  IV 3.375 g (08/08/23 0814)     LOS: 2 days    Time spent: 35 minutes    Dorcas Carrow, MD [facility]

## 2023-08-08 NOTE — Progress Notes (Signed)
PT Cancellation Note  Patient Details Name: Sonya Brady MRN: [id number] DOB: 01/19/1951   Cancelled Treatment:    Reason Eval/Treat Not Completed: PT screened, no needs identified, will sign off  Pt reports feeling better.  She is normally active and independent.  She has been ambulating with family.  Pt's son (Dr. Frederick Peers) present and reports pt moving well and no therapy needs.  Will sign off at this time.  Anise Salvo, PT Acute Rehab [facility] [phone]  Rayetta Humphrey 08/08/2023, 11:36 AM

## 2023-08-08 NOTE — Progress Notes (Signed)
Progress Note     Subjective: Pt reports abdominal pain is overall improving. Denies nausea or vomiting but having some anorexia. Having some bowel function but some cramping gas pain also. Her son is at bedside this AM.   Objective: Vital signs in last 24 hours: Temp:  [97.7 F (36.5 C)-98.1 F (36.7 C)] 97.7 F (36.5 C) (10/30 0627) Pulse Rate:  [66-75] 73 (10/30 0627) Resp:  [15-16] 15 (10/30 0627) BP: (112-133)/(65-77) 133/77 (10/30 0627) SpO2:  [95 %-97 %] 97 % (10/30 0627) Weight:  [74.4 kg] 74.4 kg (10/30 0900) Last BM Date : 08/07/23  Intake/Output from previous day: 10/29 0701 - 10/30 0700 In: 1229.1 [P.O.:780; I.V.:299.8; IV Piggyback:149.3] Out: 1425 [Urine:1425] Intake/Output this shift: Total I/O In: -  Out: 400 [Urine:400]  PE: General: pleasant, WD, WN female who is up in chair in NAD Heart: regular, rate, and rhythm. Lungs: CTAB, no wheezes, rhonchi, or rales noted.  Respiratory effort nonlabored Abd: soft, mild ttp in suprapubic and LLQ abdomen without peritonitis, ND, +BS, no masses, hernias, or organomegaly Psych: A&Ox3 with an appropriate affect.    Lab Results:  Recent Labs    08/07/23 0437 08/08/23 0511  WBC 8.9 4.8  HGB 11.0* 11.5*  HCT 33.8* 34.3*  PLT 242 252   BMET Recent Labs    08/07/23 0437 08/08/23 0511  NA 134* 137  K 3.0* 3.3*  CL 101 104  CO2 22 22  GLUCOSE 109* 105*  BUN 10 8  CREATININE 0.69 0.71  CALCIUM 8.9 9.1   PT/INR No results for input(s): "LABPROT", "INR" in the last 72 hours. CMP     Component Value Date/Time   NA 137 08/08/2023 0511   K 3.3 (L) 08/08/2023 0511   CL 104 08/08/2023 0511   CO2 22 08/08/2023 0511   GLUCOSE 105 (H) 08/08/2023 0511   BUN 8 08/08/2023 0511   CREATININE 0.71 08/08/2023 0511   CALCIUM 9.1 08/08/2023 0511   PROT 7.8 08/06/2023 1817   ALBUMIN 3.8 08/06/2023 1817   AST 24 08/06/2023 1817   ALT 15 08/06/2023 1817   ALKPHOS 84 08/06/2023 1817   BILITOT 1.0 08/06/2023 1817    GFRNONAA >60 08/08/2023 0511   GFRAA >60 11/01/2019 1547   Lipase     Component Value Date/Time   LIPASE 27 08/06/2023 1817       Studies/Results: CT ABDOMEN PELVIS W CONTRAST  Result Date: 08/06/2023 CLINICAL DATA:  -abdominal pain, vomiting, dizziness EXAM: CT ABDOMEN AND PELVIS WITH CONTRAST TECHNIQUE: Multidetector CT imaging of the abdomen and pelvis was performed using the standard protocol following bolus administration of intravenous contrast. RADIATION DOSE REDUCTION: This exam was performed according to the departmental dose-optimization program which includes automated exposure control, adjustment of the mA and/or kV according to patient size and/or use of iterative reconstruction technique. CONTRAST:  OMNIPAQUE IOHEXOL 300 MG/ML  SOLN COMPARISON:  08/04/2023 FINDINGS: Lower chest: No acute pleural or parenchymal lung disease. Hepatobiliary: Mild hepatic steatosis. No focal liver abnormality. Calcified gallstone and gallbladder sludge without evidence of acute cholecystitis. No biliary duct dilation. Pancreas: Unremarkable. No pancreatic ductal dilatation or surrounding inflammatory changes. Spleen: Normal in size without focal abnormality. Adrenals/Urinary Tract: Stable appearance of the kidneys without acute abnormality. The adrenals and bladder are unremarkable. Stomach/Bowel: Progressive segmental wall thickening and pericolonic fat stranding involving the mid sigmoid colon, consistent with worsening acute sigmoid diverticulitis. No evidence of perforation, fluid collection, or abscess. No bowel obstruction or ileus. Diverticulosis again noted throughout  the remainder of the colon. Normal appendix right lower quadrant. Vascular/Lymphatic: Aortic atherosclerosis. No enlarged abdominal or pelvic lymph nodes. Reproductive: Status post hysterectomy. No adnexal masses. Other: Trace free fluid within the lower abdomen. No free intraperitoneal gas. Small fat containing bilateral  inguinal hernias again noted. Musculoskeletal: No acute or destructive bony abnormalities. Chronic changes of Paget's disease within the right iliac bone. Reconstructed images demonstrate no additional findings. IMPRESSION: 1. Worsening acute uncomplicated diverticulitis of the sigmoid colon. No perforation, fluid collection, or abscess. Follow-up recommended to document resolution and exclude underlying colonic mass. 2. Cholelithiasis without cholecystitis. 3.  Aortic Atherosclerosis (ICD10-I70.0). Electronically Signed   By: Sharlet Salina M.D.   On: 08/06/2023 22:30    Anti-infectives: Anti-infectives (From admission, onward)    Start     Dose/Rate Route Frequency Ordered Stop   08/07/23 0000  piperacillin-tazobactam (ZOSYN) IVPB 3.375 g        3.375 g 12.5 mL/hr over 240 Minutes Intravenous Every 8 hours 08/06/23 2348          Assessment/Plan  Recurrent sigmoid diverticulitis  - CT without complicating factors like perforation or abscess - pain and leukocytosis improving with IV abx - ok to advance to FLD and then as tolerated to low fiber diet - no indication for emergent surgical intervention at this time - GI seeing and working on setting up for outpatient colonoscopy in several weeks. Would recommend colorectal follow up for consideration of elective colon resection after colonoscopy   FEN: FLD and then as tol to low fiber, ensure  VTE: LMWH ID: Zosyn 10/29>>  - per TRH -  HTN HLD Hypothyroidism GERD T2DM Depression   LOS: 2 days   I reviewed Consultant GI notes, hospitalist notes, last 24 h vitals and pain scores, last 48 h intake and output, last 24 h labs and trends, and last 24 h imaging results.    Juliet Rude, [facility] 08/08/2023, 10:38 AM Please see Amion for pager number during day hours 7:00am-4:30pm

## 2023-08-08 NOTE — Progress Notes (Addendum)
Progress Note  Primary GI: Dr. Marina Goodell  LOS: 2 days   Chief Complaint: recent diverticulitis   Subjective   Pt lying in bed, son at bedside. She reports she feels a little better today with less LLQ abdominal pain. She has been taking in small amounts of fluids like water and apple juice, reports she isn't that hungry. She agrees to try broth for lunch.  Reports small BM , fullness has improved some as well. No fever chills, nausea or vomiting. Requesting prn pain medications which are helping.   Objective   Vital signs in last 24 hours: Temp:  [97.7 F (36.5 C)-98.1 F (36.7 C)] 97.7 F (36.5 C) (10/30 0627) Pulse Rate:  [66-75] 73 (10/30 0627) Resp:  [15-16] 15 (10/30 0627) BP: (112-133)/(65-77) 133/77 (10/30 0627) SpO2:  [95 %-97 %] 97 % (10/30 0627) Last BM Date : 08/07/23 Last BM recorded by nurses in past 5 days Stool Type: Type 6 (Mushy consistency with ragged edges) (08/07/2023  2:00 PM)  General:   female in no acute distress  Heart:  Regular rate and rhythm; no murmurs Pulm: Clear anteriorly; no wheezing Abdomen: soft, nondistended, hypoactive bowel sounds in all quadrants. Mild abd tenderness without guarding. No organomegaly appreciated. Extremities:  No edema Neurologic:  Alert and  oriented x4;  No focal deficits.  Psych:  Cooperative. Normal mood and affect.  Intake/Output from previous day: 10/29 0701 - 10/30 0700 In: 1229.1 [P.O.:780; I.V.:299.8; IV Piggyback:149.3] Out: 1425 [Urine:1425] Intake/Output this shift: No intake/output data recorded.  Studies/Results: CT ABDOMEN PELVIS W CONTRAST  Result Date: 08/06/2023 CLINICAL DATA:  -abdominal pain, vomiting, dizziness EXAM: CT ABDOMEN AND PELVIS WITH CONTRAST TECHNIQUE: Multidetector CT imaging of the abdomen and pelvis was performed using the standard protocol following bolus administration of intravenous contrast. RADIATION DOSE REDUCTION: This exam was performed according to the departmental  dose-optimization program which includes automated exposure control, adjustment of the mA and/or kV according to patient size and/or use of iterative reconstruction technique. CONTRAST:  OMNIPAQUE IOHEXOL 300 MG/ML  SOLN COMPARISON:  08/04/2023 FINDINGS: Lower chest: No acute pleural or parenchymal lung disease. Hepatobiliary: Mild hepatic steatosis. No focal liver abnormality. Calcified gallstone and gallbladder sludge without evidence of acute cholecystitis. No biliary duct dilation. Pancreas: Unremarkable. No pancreatic ductal dilatation or surrounding inflammatory changes. Spleen: Normal in size without focal abnormality. Adrenals/Urinary Tract: Stable appearance of the kidneys without acute abnormality. The adrenals and bladder are unremarkable. Stomach/Bowel: Progressive segmental wall thickening and pericolonic fat stranding involving the mid sigmoid colon, consistent with worsening acute sigmoid diverticulitis. No evidence of perforation, fluid collection, or abscess. No bowel obstruction or ileus. Diverticulosis again noted throughout the remainder of the colon. Normal appendix right lower quadrant. Vascular/Lymphatic: Aortic atherosclerosis. No enlarged abdominal or pelvic lymph nodes. Reproductive: Status post hysterectomy. No adnexal masses. Other: Trace free fluid within the lower abdomen. No free intraperitoneal gas. Small fat containing bilateral inguinal hernias again noted. Musculoskeletal: No acute or destructive bony abnormalities. Chronic changes of Paget's disease within the right iliac bone. Reconstructed images demonstrate no additional findings. IMPRESSION: 1. Worsening acute uncomplicated diverticulitis of the sigmoid colon. No perforation, fluid collection, or abscess. Follow-up recommended to document resolution and exclude underlying colonic mass. 2. Cholelithiasis without cholecystitis. 3.  Aortic Atherosclerosis (ICD10-I70.0). Electronically Signed   By: Sharlet Salina M.D.   On:  08/06/2023 22:30    Lab Results: Recent Labs    08/06/23 1817 08/07/23 0437 08/08/23 0511  WBC 11.3* 8.9 4.8  HGB 12.3 11.0* 11.5*  HCT 37.2 33.8* 34.3*  PLT 272 242 252   BMET Recent Labs    08/06/23 1817 08/07/23 0437 08/08/23 0511  NA 134* 134* 137  K 3.7 3.0* 3.3*  CL 97* 101 104  CO2 23 22 22   GLUCOSE 117* 109* 105*  BUN 13 10 8   CREATININE 0.75 0.69 0.71  CALCIUM 9.3 8.9 9.1   LFT Recent Labs    08/06/23 1817  PROT 7.8  ALBUMIN 3.8  AST 24  ALT 15  ALKPHOS 84  BILITOT 1.0   PT/INR No results for input(s): "LABPROT", "INR" in the last 72 hours.   Scheduled Meds:  acetaminophen  1,000 mg Oral Q6H   atorvastatin  80 mg Oral Daily   dorzolamide-timolol  1 drop Both Eyes BID   enoxaparin (LOVENOX) injection  40 mg Subcutaneous Q24H   insulin aspart  0-6 Units Subcutaneous TID WC   latanoprost  1 drop Both Eyes QHS   levothyroxine  112 mcg Oral Q0600   montelukast  10 mg Oral QHS   pantoprazole  40 mg Oral Daily   polyethylene glycol  17 g Oral Daily   senna-docusate  1 tablet Oral BID   Continuous Infusions:  piperacillin-tazobactam (ZOSYN)  IV 3.375 g (08/08/23 9562)      Patient Narrative:  Sonya Brady is a 72 y.o. female with past medical history significant for diabetes type 2, GERD complicated by peptic stricture, diverticulosis complicated by diverticulitis, hypertension, hypothyroidism, hysterectomy, and breast lumpectomy. Presents to the ER with worsening lower abdominal pain and nausea and vomiting. CT abdomen/pelvis-shows worsening acute uncomplicated diverticulitis of the sigmoid colon.  No perforation, fluid collection, or abscess.  Cholelithiasis without cholecystitis.   Impression/Plan:   72 year old female patient with recurrent sigmoid diverticulitis, unable to tolerate oral Augmentin with worsening abdominal pain with one episode of nausea and vomiting. Admitted for IV Antibiotics.   Uncomplicated recurrent sigmoid  diverticulitis with abdominal pain, Nausea/vomiting, progressed despite outpatient antibiotic therapy (Augmentin- took few doses on 10/26) WBC 11.5-->11.3-->8.9-->4.8,  HD stable BUN 8/0.71 10/26 CT abdomen/pelvis-shows pan colonic diverticulosis with circumferential wall thickening consistent with acute uncomplicated diverticulitis.  Cholelithiasis without evidence of acute cholecystitis. 10/28 CT abdomen/pelvis-shows worsening acute uncomplicated diverticulitis of the sigmoid colon.  No perforation, fluid collection, or abscess.  Cholelithiasis without cholecystitis. 01/23/2017 colonoscopy with 2 benign polyps and diverticulosis throughout left and right colon. -Continue Zosyn IV every 8 hrs -Continue CL diet, ADAT -pain management and antiemetics per hospitalist -Patient seen and evaluated by surgery, and plans to arrange follow-up outpatient to consider robotic partial colectomy. -Will schedule follow-up with our office in 4-6 weeks with plans to arrange colonoscopy after that.   Cholelithiasis without cholecystitis on Ct scan. Normal LFT's. WBC 4.8. No RUQ abdominal pain.   Chronic constipation, BM overnight -Continue Miralax po daily - Continue Senokot 1 tab BID   GERD -on home protonix   Acute anemia, Hgb 11.5, stable. No overt bleeding.   Hypokalemia, today is 3.3. -replete per protocol   Hypothyroidism- last TSH 1.23 (5/24)   Principal Problem:   Sigmoid diverticulitis   Deanna J May  08/08/2023, 8:30 AM     [facility] Attending   I have taken an interval history, reviewed the chart and have seen the patient. I agree with the Advanced Practitioner's note, impression and recommendations with the following additions:  She is improving.  She has an outpatient appointment with [name], PA of our group on  August 31, 2023.  At that point she will be reassessed and can be scheduled for a colonoscopy prior to elective segmental resection of diverticular  disease.  Further plans here per [facility] and [facility].  We will sign off.  Iva Boop, MD, [facility] See Loretha Stapler on call - gastroenterology for best contact person 08/08/2023 3:36 PM

## 2023-08-09 ENCOUNTER — Telehealth: Payer: Self-pay

## 2023-08-09 DIAGNOSIS — K5732 Diverticulitis of large intestine without perforation or abscess without bleeding: Secondary | ICD-10-CM | POA: Diagnosis not present

## 2023-08-09 LAB — GLUCOSE, CAPILLARY
Glucose-Capillary: 134 mg/dL — ABNORMAL HIGH (ref 70–99)
Glucose-Capillary: 183 mg/dL — ABNORMAL HIGH (ref 70–99)
Glucose-Capillary: 118 mg/dL — ABNORMAL HIGH (ref 70–99)
Glucose-Capillary: 116 mg/dL — ABNORMAL HIGH (ref 70–99)

## 2023-08-09 LAB — CBC
HCT: 32.3 % — ABNORMAL LOW (ref 36.0–46.0)
Hemoglobin: 10.9 g/dL — ABNORMAL LOW (ref 12.0–15.0)
MCH: 29.9 pg (ref 26.0–34.0)
MCHC: 33.7 g/dL (ref 30.0–36.0)
MCV: 88.7 fL (ref 80.0–100.0)
Platelets: 290 10*3/uL (ref 150–400)
RBC: 3.64 MIL/uL — ABNORMAL LOW (ref 3.87–5.11)
RDW: 12.3 % (ref 11.5–15.5)
WBC: 3.7 10*3/uL — ABNORMAL LOW (ref 4.0–10.5)
nRBC: 0 % (ref 0.0–0.2)

## 2023-08-09 LAB — BASIC METABOLIC PANEL
Anion gap: 7 (ref 5–15)
BUN: 12 mg/dL (ref 8–23)
CO2: 22 mmol/L (ref 22–32)
Calcium: 9 mg/dL (ref 8.9–10.3)
Chloride: 110 mmol/L (ref 98–111)
Creatinine, Ser: 0.62 mg/dL (ref 0.44–1.00)
GFR, Estimated: >60 mL/min (ref >60)
Glucose, Bld: 118 mg/dL — ABNORMAL HIGH (ref 70–99)
Potassium: 3.8 mmol/L (ref 3.5–5.1)
Sodium: 139 mmol/L (ref 135–145)

## 2023-08-09 MED ORDER — ENSURE MAX PROTEIN PO LIQD
11.0000 [oz_av] | Freq: Two times a day (BID) | ORAL | Status: DC
  Administered 2023-08-09 (×2): 11 [oz_av] via ORAL

## 2023-08-09 NOTE — Telephone Encounter (Signed)
-----   Message from Yancey Flemings sent at 08/09/2023 11:12 AM EDT ----- Regarding: RE: Follow-up All, She is still in the hospital. She should keep her appointment with Victorino Dike on November 22. I would not schedule colonoscopy until she has been seen and found to be doing well.  Furthermore, we generally wait at least 6 weeks after a bout of diverticulitis before proceeding with colonoscopy. Thanks, Dr. Marina Goodell ----- Message ----- From: Chrystie Nose, RN Sent: 08/09/2023  10:53 AM EDT To: Hilarie Fredrickson, MD Subject: FW: Follow-up                                  See note below and advise. ----- Message ----- From: May, Deanna J, NP Sent: 08/09/2023  10:45 AM EDT To: Chrystie Nose, RN Subject: Follow-up                                      Bonita Quin, Good morning, this patient's son who is a doctor here at the hospital wanted to see if we could go ahead and set her up for colonoscopy in 6 weeks. She was in hospital for diverticulitis. Has fup with Victorino Dike on Nov 22nd then surgery is seeing her Dec 4th about possible resection. However they want colonoscopy done prior to. If you  could just run it by Dr. Marina Goodell and if Victorino Dike has any reservations during appt on 22nd she can always cancel procedure. He would just like it on the books ahead. Thank you,  Deanna

## 2023-08-09 NOTE — Plan of Care (Signed)

## 2023-08-09 NOTE — Progress Notes (Signed)
   Progress Note     Subjective: Pt reports abdominal pain continues to improve. Discussed low fiber diet. Discussed follow up with colorectal surgery in the office.   Objective: Vital signs in last 24 hours: Temp:  [97.7 F (36.5 C)-97.9 F (36.6 C)] 97.7 F (36.5 C) (10/31 0605) Pulse Rate:  [68-82] 68 (10/31 0605) Resp:  [16-18] 18 (10/31 0605) BP: (118-141)/(62-85) 141/85 (10/31 0605) SpO2:  [96 %-99 %] 99 % (10/31 0605) Last BM Date : 08/08/23  Intake/Output from previous day: 10/30 0701 - 10/31 0700 In: 2439.3 [P.O.:1080; I.V.:1208.8; IV Piggyback:150.5] Out: 700 [Urine:700] Intake/Output this shift: No intake/output data recorded.  PE: General: pleasant, WD, WN female who is up in chair in NAD Lungs: Respiratory effort nonlabored Abd: soft, mild ttp in suprapubic and LLQ abdomen without peritonitis, ND Psych: A&Ox3 with an appropriate affect.    Lab Results:  Recent Labs    08/08/23 0511 08/09/23 0441  WBC 4.8 3.7*  HGB 11.5* 10.9*  HCT 34.3* 32.3*  PLT 252 290   BMET Recent Labs    08/08/23 0511 08/09/23 0441  NA 137 139  K 3.3* 3.8  CL 104 110  CO2 22 22  GLUCOSE 105* 118*  BUN 8 12  CREATININE 0.71 0.62  CALCIUM 9.1 9.0   PT/INR No results for input(s): "LABPROT", "INR" in the last 72 hours. CMP     Component Value Date/Time   NA 139 08/09/2023 0441   K 3.8 08/09/2023 0441   CL 110 08/09/2023 0441   CO2 22 08/09/2023 0441   GLUCOSE 118 (H) 08/09/2023 0441   BUN 12 08/09/2023 0441   CREATININE 0.62 08/09/2023 0441   CALCIUM 9.0 08/09/2023 0441   PROT 7.8 08/06/2023 1817   ALBUMIN 3.8 08/06/2023 1817   AST 24 08/06/2023 1817   ALT 15 08/06/2023 1817   ALKPHOS 84 08/06/2023 1817   BILITOT 1.0 08/06/2023 1817   GFRNONAA >60 08/09/2023 0441   GFRAA >60 11/01/2019 1547   Lipase     Component Value Date/Time   LIPASE 27 08/06/2023 1817       Studies/Results: No results found.  Anti-infectives: Anti-infectives (From  admission, onward)    Start     Dose/Rate Route Frequency Ordered Stop   08/07/23 0000  piperacillin-tazobactam (ZOSYN) IVPB 3.375 g        3.375 g 12.5 mL/hr over 240 Minutes Intravenous Every 8 hours 08/06/23 2348          Assessment/Plan  Recurrent sigmoid diverticulitis  - CT without complicating factors like perforation or abscess - pain and leukocytosis improving with IV abx - ok to advance to low fiber diet - no indication for emergent surgical intervention at this time - general surgery will sign off at this time. Outpatient colonoscopy in several weeks as planned and I have scheduled outpatient follow up with colorectal surgery to discuss elective partial colectomy   FEN: low fiber, ensure  VTE: LMWH ID: Zosyn 10/29>>  - per TRH -  HTN HLD Hypothyroidism GERD T2DM Depression   LOS: 3 days   I reviewed Consultant GI notes, hospitalist notes, last 24 h vitals and pain scores, last 48 h intake and output, and last 24 h labs and trends.    Juliet Rude, [facility] Surgery 08/09/2023, 9:43 AM Please see Amion for pager number during day hours 7:00am-4:30pm

## 2023-08-09 NOTE — Plan of Care (Signed)
Nutrition Education Note  RD consulted for nutrition education regarding Nutrition Management for Diverticulitis.  RD provided "Low Fiber Nutrition Therapy" handout from the [employer].   Patient reports she typically eats 2 meals a day at home. Usually breakfast, which consists of cereal or cookies and coffee or fava beans with cheese, and dinner which consists of a meat and vegetables.   Reviewed home diet with pt and suggested ways to meet nutrition goals over the next several weeks. Discussed foods in all food groups recommended over the next few weeks and foods to avoid. Explained reasons to follow Low Fiber diet for the next 4-6 weeks and discussed ways to achieve. Encouraged fluid intake.   Pt verbalizes understanding of information provided. Patient's son at bedside for education. Expect good compliance.  Current diet order is Soft. Labs and medications reviewed. No further nutrition interventions warranted at this time. RD contact information provided. If additional nutrition issues arise, please re-consult RD.  Shelle Iron RD, LDN For contact information, refer to [facility]

## 2023-08-09 NOTE — Progress Notes (Signed)
PROGRESS NOTE    HANSINI GULBRANSON  ZOX:[id number] DOB: Feb 08, 1951 DOA: 08/06/2023 PCP: Myrlene Broker, MD    Brief Narrative:  72 year old with history of hypertension, type 2 diabetes, hyperlipidemia, hypothyroidism, recurrent sigmoid diverticulitis who was on oral Augmentin therapy with symptomatic for last 2 days had persistent lower abdominal pain cramping, fecal urgency and urinary urgency so was brought to the emergency room.  She was found to have acute sigmoid diverticulitis without perforation.  Admitted due to symptomatic diverticulitis.  Subjective:  Patient seen and examined.  Still has mild abdominal pain.  Denies any nausea or vomiting.  Able to mobilize around without problem.  She had 1 formed stool overnight.  Tolerated liquids.   Assessment & Plan:   Acute recurrent sigmoid diverticulitis, failed outpatient therapy. Some clinical improvement today.  Due to failed outpatient therapy, will continue IV Zosyn today. Challenge with soft diet today. Stool softener and laxative regimen. Followed by GI and surgery,  Plan is to stabilize her, able to eat and pain controlled and go home.  Interval colonoscopy and subsequent sigmoidectomy.  GI and surgery will schedule outpatient follow-up.  Hypokalemia: Replaced and adequate..  Chronic medical issues  Type 2 diabetes: On oral hypoglycemics at home.  Keep on SSI. Essential hypertension: Holding blood pressure medications. Hypothyroidism: On levothyroxine.  Continued. Hyperlipidemia: On statin. GERD: On PPI     DVT prophylaxis: enoxaparin (LOVENOX) injection 40 mg Start: 08/07/23 1000   Code Status: Full code Family Communication: Son Dr. Frederick Peers at the bedside. Disposition Plan: Status is: Inpatient Remains inpatient appropriate because: Oral intake challenge, IV fluids     Consultants:  Gastroenterology General surgery  Procedures:  None  Antimicrobials:  Zosyn 10/28----     Objective: Vitals:    08/08/23 0900 08/08/23 1339 08/08/23 2102 08/09/23 0605  BP:  118/62 130/67 (!) 141/85  Pulse:  75 82 68  Resp:  16 18 18   Temp:  97.7 F (36.5 C) 97.9 F (36.6 C) 97.7 F (36.5 C)  TempSrc:  Oral Oral Oral  SpO2:  96% 97% 99%  Weight: 74.4 kg     Height: 5\' 6"  (1.676 m)       Intake/Output Summary (Last 24 hours) at 08/09/2023 1301 Last data filed at 08/09/2023 0907 Gross per 24 hour  Intake 2296.65 ml  Output 300 ml  Net 1996.65 ml   Filed Weights   08/08/23 0900  Weight: 74.4 kg    Examination:  General exam: Appears calm and comfortable .  Walking around in the room. Respiratory system: Clear to auscultation. Respiratory effort normal. Gastrointestinal system: Soft.  Nontender.  Bowel sounds present.   Central nervous system: Alert and oriented. No focal neurological deficits. Extremities: Symmetric 5 x 5 power. Skin: No rashes, lesions or ulcers Psychiatry: Judgement and insight appear normal. Mood & affect appropriate.     Data Reviewed: I have personally reviewed following labs and imaging studies  CBC: Recent Labs  Lab 08/04/23 1422 08/06/23 1817 08/07/23 0437 08/08/23 0511 08/09/23 0441  WBC 11.5* 11.3* 8.9 4.8 3.7*  HGB 13.4 12.3 11.0* 11.5* 10.9*  HCT 39.2 37.2 33.8* 34.3* 32.3*  MCV 87.7 89.4 90.4 88.6 88.7  PLT 261 272 242 252 290   Basic Metabolic Panel: Recent Labs  Lab 08/04/23 1422 08/06/23 1817 08/07/23 0437 08/08/23 0511 08/09/23 0441  NA 135 134* 134* 137 139  K 4.0 3.7 3.0* 3.3* 3.8  CL 98 97* 101 104 110  CO2 26 23 22 22  22  GLUCOSE 112* 117* 109* 105* 118*  BUN 13 13 10 8 12   CREATININE 0.67 0.75 0.69 0.71 0.62  CALCIUM 10.5* 9.3 8.9 9.1 9.0  MG  --   --  2.0  --   --   PHOS  --   --  3.6  --   --    GFR: Estimated Creatinine Clearance: 64.6 mL/min (by C-G formula based on SCr of 0.62 mg/dL). Liver Function Tests: Recent Labs  Lab 08/04/23 1422 08/06/23 1817  AST 16 24  ALT 15 15  ALKPHOS 84 84  BILITOT 1.0  1.0  PROT 8.6* 7.8  ALBUMIN 4.6 3.8   Recent Labs  Lab 08/04/23 1422 08/06/23 1817  LIPASE 43 27   No results for input(s): "AMMONIA" in the last 168 hours. Coagulation Profile: No results for input(s): "INR", "PROTIME" in the last 168 hours. Cardiac Enzymes: No results for input(s): "CKTOTAL", "CKMB", "CKMBINDEX", "TROPONINI" in the last 168 hours. BNP (last 3 results) No results for input(s): "PROBNP" in the last 8760 hours. HbA1C: No results for input(s): "HGBA1C" in the last 72 hours. CBG: Recent Labs  Lab 08/08/23 1125 08/08/23 1730 08/08/23 2142 08/09/23 0712 08/09/23 1131  GLUCAP 139* 116* 138* 134* 183*   Lipid Profile: No results for input(s): "CHOL", "HDL", "LDLCALC", "TRIG", "CHOLHDL", "LDLDIRECT" in the last 72 hours. Thyroid Function Tests: No results for input(s): "TSH", "T4TOTAL", "FREET4", "T3FREE", "THYROIDAB" in the last 72 hours. Anemia Panel: No results for input(s): "VITAMINB12", "FOLATE", "FERRITIN", "TIBC", "IRON", "RETICCTPCT" in the last 72 hours. Sepsis Labs: Recent Labs  Lab 08/06/23 1824  LATICACIDVEN 1.0    No results found for this or any previous visit (from the past 240 hour(s)).       Radiology Studies: No results found.      Scheduled Meds:  acetaminophen  1,000 mg Oral Q6H   atorvastatin  80 mg Oral Daily   dorzolamide-timolol  1 drop Both Eyes BID   enoxaparin (LOVENOX) injection  40 mg Subcutaneous Q24H   insulin aspart  0-6 Units Subcutaneous TID WC   latanoprost  1 drop Both Eyes QHS   levothyroxine  112 mcg Oral Q0600   montelukast  10 mg Oral QHS   pantoprazole  40 mg Oral Daily   polyethylene glycol  17 g Oral Daily   Ensure Max Protein  11 oz Oral BID   senna-docusate  1 tablet Oral BID   Continuous Infusions:  piperacillin-tazobactam (ZOSYN)  IV 3.375 g (08/09/23 0906)     LOS: 3 days    Time spent: 35 minutes    Dorcas Carrow, MD [facility]

## 2023-08-09 NOTE — Telephone Encounter (Signed)
Noted  

## 2023-08-10 DIAGNOSIS — K5732 Diverticulitis of large intestine without perforation or abscess without bleeding: Secondary | ICD-10-CM | POA: Diagnosis not present

## 2023-08-10 LAB — GLUCOSE, CAPILLARY: Glucose-Capillary: 122 mg/dL — ABNORMAL HIGH (ref 70–99)

## 2023-08-10 MED ORDER — SENNOSIDES-DOCUSATE SODIUM 8.6-50 MG PO TABS: 1.0000 | ORAL_TABLET | Freq: Two times a day (BID) | ORAL | 0 refills | Status: AC

## 2023-08-10 MED ORDER — POLYETHYLENE GLYCOL 3350 17 G PO PACK: 17.0000 g | PACK | Freq: Every day | ORAL | 0 refills | Status: AC

## 2023-08-10 NOTE — Progress Notes (Signed)
Discharge instructions discussed with patient and family, verbalized agreement and understanding 

## 2023-08-10 NOTE — Care Management Important Message (Signed)
Important Message  Patient Details IM Letter given Name: Sonya Brady MRN: [id number] Date of Birth: [birth year]   Important Message Given:  Yes - Medicare IM     Caren Macadam 08/10/2023, 9:56 AM

## 2023-08-10 NOTE — Discharge Summary (Signed)
Physician Discharge Summary  Sonya Brady:[id number] DOB: 1951-06-20 DOA: 08/06/2023  PCP: Myrlene Broker, MD  Admit date: 08/06/2023 Discharge date: 08/10/2023  Admitted From: Home Disposition: Home  Recommendations for Outpatient Follow-up:  Follow up with PCP in 1-2 weeks GI and surgery follow-up as already scheduled  Discharge Condition: Stable CODE STATUS: Full code Diet recommendation: Soft diet, liquid diet and avoidance of constipation.  Laxative regimen.  Discharge summary: 72 year old female with history of hypertension, type 2 diabetes, hyperlipidemia, hypothyroidism, recurrent sigmoid diverticulitis who was on oral Augmentin therapy with symptomatic abdominal pain for last 2 days, had persistent lower abdominal pain cramping, fecal urgency and urinary urgency so was brought to the emergency room.  She was found to have acute sigmoid diverticulitis without perforation.    # Acute recurrent sigmoid diverticulitis, failed outpatient therapy. Due to failed outpatient therapy, she was admitted and treated with IV antibiotics with IV Zosyn. Gradually diet was introduced and currently tolerating full liquid and soft diet. Symptoms are mostly improved now. Going home with continuing Augmentin for additional 6 days to complete 10 days of therapy. Stool softener and laxative regimen. Plan is outpatient follow-up, colonoscopy then subsequent sigmoidectomy. Electrolytes are adequate.  Most of the chronic medical issues are stable.  Resume all home medications including oral hypoglycemics, antihypertensives, levothyroxine and a statin.  She is also on PPI.      Discharge Diagnoses:  Principal Problem:   Sigmoid diverticulitis    Discharge Instructions  Discharge Instructions     Call MD for:  severe uncontrolled pain   Complete by: As directed    Diet - low sodium heart healthy   Complete by: As directed    Mostly soft and low fiber diet   Discharge  instructions   Complete by: As directed    Complete course of antibiotics that you already have   Increase activity slowly   Complete by: As directed       Allergies as of 08/10/2023       Reactions   Flagyl [metronidazole]    Redness on arms   No Known Allergies         Medication List     STOP taking these medications    albuterol 108 (90 Base) MCG/ACT inhaler Commonly known as: VENTOLIN HFA   cephALEXin 500 MG capsule Commonly known as: KEFLEX   dicyclomine 10 MG capsule Commonly known as: BENTYL       TAKE these medications    amoxicillin-clavulanate 875-125 MG tablet Commonly known as: AUGMENTIN Take 1 tablet by mouth 2 (two) times daily for 7 days.   atorvastatin 80 MG tablet Commonly known as: LIPITOR Take 1 tablet (80 mg total) by mouth daily.   dorzolamide-timolol 2-0.5 % ophthalmic solution Commonly known as: COSOPT Place 1 drop into both eyes 2 (two) times daily.   latanoprost 0.005 % ophthalmic solution Commonly known as: XALATAN Place 1 drop into both eyes at bedtime.   levothyroxine 112 MCG tablet Commonly known as: SYNTHROID Take 1 tablet (112 mcg total) by mouth daily before breakfast.   lisinopril-hydrochlorothiazide 20-25 MG tablet Commonly known as: ZESTORETIC Take 1 tablet by mouth daily.   Melatonin 10 MG Tabs Take 1 tablet by mouth at bedtime as needed.   metFORMIN 500 MG 24 hr tablet Commonly known as: GLUCOPHAGE-XR Take 3 tablets (1,500 mg total) by mouth daily with breakfast.   montelukast 10 MG tablet Commonly known as: SINGULAIR Take 10 mg by mouth at bedtime.  MULTIVITAMIN PO Take 1 tablet by mouth daily.   omeprazole 20 MG capsule Commonly known as: PRILOSEC Take 1 capsule (20 mg total) by mouth daily.   ondansetron 4 MG disintegrating tablet Commonly known as: ZOFRAN-ODT Take 1 tablet (4 mg total) by mouth every 8 (eight) hours as needed.   OneTouch Delica Lancets 30G Misc USE 1  TO CHECK GLUCOSE ONCE  DAILY   OneTouch Verio test strip Generic drug: glucose blood USE 1 STRIP TO CHECK GLUCOSE ONCE DAILY   OneTouch Verio w/Device Kit Use As Directed   oxyCODONE 5 MG immediate release tablet Commonly known as: Roxicodone Take 1 tablet (5 mg total) by mouth every 4 (four) hours as needed for severe pain (pain score 7-10).   polyethylene glycol 17 g packet Commonly known as: MIRALAX / GLYCOLAX Take 17 g by mouth daily. Start taking on: August 11, 2023   senna-docusate 8.6-50 MG tablet Commonly known as: Senokot-S Take 1 tablet by mouth 2 (two) times daily.   UNABLE TO FIND Clindomycin ear drop 4 drops 3 x week to left ear on Monday Wednesday and friday        Follow-up Information     Andria Meuse, MD. Go on 09/12/2023.   Specialties: General Surgery, Colon and Rectal Surgery Why: 3 PM, please arrive 30 min prior to appointment time. Contact information: [address] [place] [address] [phone]                Allergies  Allergen Reactions   Flagyl [Metronidazole]     Redness on arms   No Known Allergies     Consultations: General surgery GI   Procedures/Studies: CT ABDOMEN PELVIS W CONTRAST  Result Date: 08/06/2023 CLINICAL DATA:  -abdominal pain, vomiting, dizziness EXAM: CT ABDOMEN AND PELVIS WITH CONTRAST TECHNIQUE: Multidetector CT imaging of the abdomen and pelvis was performed using the standard protocol following bolus administration of intravenous contrast. RADIATION DOSE REDUCTION: This exam was performed according to the departmental dose-optimization program which includes automated exposure control, adjustment of the mA and/or kV according to patient size and/or use of iterative reconstruction technique. CONTRAST:  OMNIPAQUE IOHEXOL 300 MG/ML  SOLN COMPARISON:  08/04/2023 FINDINGS: Lower chest: No acute pleural or parenchymal lung disease. Hepatobiliary: Mild hepatic steatosis. No focal liver  abnormality. Calcified gallstone and gallbladder sludge without evidence of acute cholecystitis. No biliary duct dilation. Pancreas: Unremarkable. No pancreatic ductal dilatation or surrounding inflammatory changes. Spleen: Normal in size without focal abnormality. Adrenals/Urinary Tract: Stable appearance of the kidneys without acute abnormality. The adrenals and bladder are unremarkable. Stomach/Bowel: Progressive segmental wall thickening and pericolonic fat stranding involving the mid sigmoid colon, consistent with worsening acute sigmoid diverticulitis. No evidence of perforation, fluid collection, or abscess. No bowel obstruction or ileus. Diverticulosis again noted throughout the remainder of the colon. Normal appendix right lower quadrant. Vascular/Lymphatic: Aortic atherosclerosis. No enlarged abdominal or pelvic lymph nodes. Reproductive: Status post hysterectomy. No adnexal masses. Other: Trace free fluid within the lower abdomen. No free intraperitoneal gas. Small fat containing bilateral inguinal hernias again noted. Musculoskeletal: No acute or destructive bony abnormalities. Chronic changes of Paget's disease within the right iliac bone. Reconstructed images demonstrate no additional findings. IMPRESSION: 1. Worsening acute uncomplicated diverticulitis of the sigmoid colon. No perforation, fluid collection, or abscess. Follow-up recommended to document resolution and exclude underlying colonic mass. 2. Cholelithiasis without cholecystitis. 3.  Aortic Atherosclerosis (ICD10-I70.0). Electronically Signed   By: Sharlet Salina M.D.   On:  08/06/2023 22:30   CT ABDOMEN PELVIS W CONTRAST  Result Date: 08/04/2023 CLINICAL DATA:  Left lower quadrant abdominal pain, recent diagnosis of UTI EXAM: CT ABDOMEN AND PELVIS WITH CONTRAST TECHNIQUE: Multidetector CT imaging of the abdomen and pelvis was performed using the standard protocol following bolus administration of intravenous contrast. RADIATION DOSE  REDUCTION: This exam was performed according to the departmental dose-optimization program which includes automated exposure control, adjustment of the mA and/or kV according to patient size and/or use of iterative reconstruction technique. CONTRAST:  OMNIPAQUE IOHEXOL 300 MG/ML  SOLN COMPARISON:  09/30/2020 FINDINGS: Lower chest: No acute abnormality.  Coronary artery calcifications Hepatobiliary: No solid liver abnormality is seen. Gallstone. No gallbladder wall thickening, or biliary dilatation. Pancreas: Unremarkable. No pancreatic ductal dilatation or surrounding inflammatory changes. Spleen: Normal in size without significant abnormality. Adrenals/Urinary Tract: Adrenal glands are unremarkable. Simple, benign bilateral renal cortical cysts, for which no further follow-up or characterization is required. Kidneys are otherwise normal, without renal calculi, solid lesion, or hydronephrosis. Bladder is unremarkable. Stomach/Bowel: Stomach is within normal limits. Appendix appears normal. Pancolonic diverticulosis. Relatively focal circumferential wall thickening and adjacent fat stranding of the mid sigmoid colon, segment 5 cm in length (series 2, image 63). Vascular/Lymphatic: Aortic atherosclerosis. No enlarged abdominal or pelvic lymph nodes. Reproductive: Status post hysterectomy. Other: Bilateral inguinal hernias.  No ascites. Musculoskeletal: No acute or significant osseous findings. IMPRESSION: 1. Pancolonic diverticulosis. Relatively focal circumferential wall thickening and adjacent fat stranding of the mid sigmoid colon, segment 5 cm in length, consistent with acute uncomplicated diverticulitis. Underlying mass not excluded. Consider colonoscopy at the resolution of acute clinical presentation to exclude underlying mass. 2. Cholelithiasis without evidence of acute cholecystitis. 3. Coronary artery disease. Aortic Atherosclerosis (ICD10-I70.0). Electronically Signed   By: Jearld Lesch M.D.   On:  08/04/2023 18:24   (Echo, Carotid, EGD, Colonoscopy, ERCP)    Subjective: Patient seen and examined.  Denies any complaints.  Today without any pain.  She ate some soft diet.  She had regular bowel movement earlier today.  Eager to go home.   Discharge Exam: Vitals:   08/09/23 2144 08/10/23 0559  BP: (!) 159/81 (!) 158/79  Pulse: 71 67  Resp: 18 18  Temp: 97.9 F (36.6 C) 97.6 F (36.4 C)  SpO2: 99% 98%   Vitals:   08/09/23 0605 08/09/23 1401 08/09/23 2144 08/10/23 0559  BP: (!) 141/85 (!) 150/83 (!) 159/81 (!) 158/79  Pulse: 68 72 71 67  Resp: 18 16 18 18   Temp: 97.7 F (36.5 C) 97.9 F (36.6 C) 97.9 F (36.6 C) 97.6 F (36.4 C)  TempSrc: Oral Oral Oral Oral  SpO2: 99% 100% 99% 98%  Weight:      Height:        General: Pt is alert, awake, not in acute distress Cardiovascular: RRR, S1/S2 +, no rubs, no gallops Respiratory: CTA bilaterally, no wheezing, no rhonchi Abdominal: Soft, NT, ND, bowel sounds + Extremities: no edema, no cyanosis    The results of significant diagnostics from this hospitalization (including imaging, microbiology, ancillary and laboratory) are listed below for reference.     Microbiology: No results found for this or any previous visit (from the past 240 hour(s)).   Labs: BNP (last 3 results) No results for input(s): "BNP" in the last 8760 hours. Basic Metabolic Panel: Recent Labs  Lab 08/04/23 1422 08/06/23 1817 08/07/23 0437 08/08/23 0511 08/09/23 0441  NA 135 134* 134* 137 139  K 4.0 3.7 3.0* 3.3* 3.8  CL 98 97* 101 104 110  CO2 26 23 22 22 22   GLUCOSE 112* 117* 109* 105* 118*  BUN 13 13 10 8 12   CREATININE 0.67 0.75 0.69 0.71 0.62  CALCIUM 10.5* 9.3 8.9 9.1 9.0  MG  --   --  2.0  --   --   PHOS  --   --  3.6  --   --    Liver Function Tests: Recent Labs  Lab 08/04/23 1422 08/06/23 1817  AST 16 24  ALT 15 15  ALKPHOS 84 84  BILITOT 1.0 1.0  PROT 8.6* 7.8  ALBUMIN 4.6 3.8   Recent Labs  Lab 08/04/23 1422  08/06/23 1817  LIPASE 43 27   No results for input(s): "AMMONIA" in the last 168 hours. CBC: Recent Labs  Lab 08/04/23 1422 08/06/23 1817 08/07/23 0437 08/08/23 0511 08/09/23 0441  WBC 11.5* 11.3* 8.9 4.8 3.7*  HGB 13.4 12.3 11.0* 11.5* 10.9*  HCT 39.2 37.2 33.8* 34.3* 32.3*  MCV 87.7 89.4 90.4 88.6 88.7  PLT 261 272 242 252 290   Cardiac Enzymes: No results for input(s): "CKTOTAL", "CKMB", "CKMBINDEX", "TROPONINI" in the last 168 hours. BNP: Invalid input(s): "POCBNP" CBG: Recent Labs  Lab 08/09/23 0712 08/09/23 1131 08/09/23 1642 08/09/23 2145 08/10/23 0728  GLUCAP 134* 183* 118* 116* 122*   D-Dimer No results for input(s): "DDIMER" in the last 72 hours. Hgb A1c No results for input(s): "HGBA1C" in the last 72 hours. Lipid Profile No results for input(s): "CHOL", "HDL", "LDLCALC", "TRIG", "CHOLHDL", "LDLDIRECT" in the last 72 hours. Thyroid function studies No results for input(s): "TSH", "T4TOTAL", "T3FREE", "THYROIDAB" in the last 72 hours.  Invalid input(s): "FREET3" Anemia work up No results for input(s): "VITAMINB12", "FOLATE", "FERRITIN", "TIBC", "IRON", "RETICCTPCT" in the last 72 hours. Urinalysis    Component Value Date/Time   COLORURINE YELLOW 08/06/2023 1748   APPEARANCEUR CLEAR 08/06/2023 1748   LABSPEC 1.013 08/06/2023 1748   PHURINE 7.0 08/06/2023 1748   GLUCOSEU NEGATIVE 08/06/2023 1748   GLUCOSEU NEGATIVE 07/25/2023 0855   HGBUR NEGATIVE 08/06/2023 1748   BILIRUBINUR NEGATIVE 08/06/2023 1748   BILIRUBINUR neg 05/17/2013 1035   KETONESUR NEGATIVE 08/06/2023 1748   PROTEINUR NEGATIVE 08/06/2023 1748   UROBILINOGEN 0.2 07/25/2023 0855   NITRITE NEGATIVE 08/06/2023 1748   LEUKOCYTESUR TRACE (A) 08/06/2023 1748   Sepsis Labs Recent Labs  Lab 08/06/23 1817 08/07/23 0437 08/08/23 0511 08/09/23 0441  WBC 11.3* 8.9 4.8 3.7*   Microbiology No results found for this or any previous visit (from the past 240 hour(s)).   Time  coordinating discharge:  28 minutes  SIGNED:   Dorcas Carrow, MD  [facility] 08/10/2023, 9:17 AM

## 2023-08-13 ENCOUNTER — Telehealth: Payer: Self-pay | Admitting: *Deleted

## 2023-08-13 NOTE — Transitions of Care (Post Inpatient/ED Visit) (Signed)
08/13/2023  Name: Sonya Brady MRN: [id number] DOB: 12-18-1950  Today's TOC FU Call Status: Today's TOC FU Call Status:: Successful TOC FU Call Completed TOC FU Call Complete Date: 08/13/23 Patient's Name and Date of Birth confirmed.  Transition Care Management Follow-up Telephone Call Date of Discharge: 08/10/23 Discharge Facility: [facility]) Type of Discharge: Inpatient Admission How have you been since you were released from the hospital?: Better ("I am doing much better, back to my normal self.  Not having any problems at all.  My son is a hospitalist doctor at [facility]-- he makes sure I have everything I need-- but I am really independent and don't need any help with anything") Any questions or concerns?: No  Items Reviewed: Did you receive and understand the discharge instructions provided?: Yes (thoroughly reviewed with patient who verbalizes good understanding of same) Medications obtained,verified, and reconciled?: Yes (Medications Reviewed) (Full medication reconciliation/ review completed; no concerns or discrepancies identified; confirmed patient obtained/ is taking all newly Rx'd medications as instructed; self-manages medications and denies questions/ concerns around medications today) Any new allergies since your discharge?: No Dietary orders reviewed?: Yes Type of Diet Ordered:: "Soft/ conservative" Do you have support at home?: Yes People in Home: child(ren), adult Name of Support/Comfort Primary Source: Reports independent in self-care activities; resides with adult daughter who assists as/ if needed/ indicated- reports her son is a hospitalist MD at [facility]  Medications Reviewed Today: Medications Reviewed Today     Reviewed by Michaela Corner, RN (Registered Nurse) on 08/13/23 at 1211  Med List Status: <None>   Medication Order Taking? Sig Documenting Provider Last Dose Status Informant  atorvastatin (LIPITOR) 80 MG tablet [id number] Yes Take 1 tablet  (80 mg total) by mouth daily. Myrlene Broker, MD Taking Active Self  Blood Glucose Monitoring Suppl [facility] VERIO) w/Device Andria Rhein [id number] Yes Use As Directed Myrlene Broker, MD Taking Active Self  dorzolamide-timolol (COSOPT) 2-0.5 % ophthalmic solution [id number] Yes Place 1 drop into both eyes 2 (two) times daily. [provider] Taking Active Self  glucose blood (ONETOUCH VERIO) test strip [id number] Yes USE 1 STRIP TO CHECK GLUCOSE ONCE DAILY Reather Littler, MD Taking Active Self  latanoprost (XALATAN) 0.005 % ophthalmic solution [id number] Yes Place 1 drop into both eyes at bedtime. [provider] Taking Active Self  levothyroxine (SYNTHROID) 112 MCG tablet [id number] Yes Take 1 tablet (112 mcg total) by mouth daily before breakfast. Myrlene Broker, MD Taking Active Self  lisinopril-hydrochlorothiazide (ZESTORETIC) 20-25 MG tablet [id number] Yes Take 1 tablet by mouth daily. Myrlene Broker, MD Taking Active Self  Melatonin 10 MG TABS [id number] Yes Take 1 tablet by mouth at bedtime as needed. [provider] Taking Active Self  metFORMIN (GLUCOPHAGE-XR) 500 MG 24 hr tablet [id number] Yes Take 3 tablets (1,500 mg total) by mouth daily with breakfast. Myrlene Broker, MD Taking Active Self  montelukast (SINGULAIR) 10 MG tablet [id number] Yes Take 10 mg by mouth at bedtime. [provider] Taking Active Self  Multiple Vitamins-Minerals (MULTIVITAMIN PO) [id number] Yes Take 1 tablet by mouth daily. [provider] Taking Active Self  omeprazole (PRILOSEC) 20 MG capsule [id number] Yes Take 1 capsule (20 mg total) by mouth daily. Hilarie Fredrickson, MD Taking Active Self  ondansetron (ZOFRAN-ODT) 4 MG disintegrating tablet [id number] Yes Take 1 tablet (4 mg total) by mouth every 8 (eight) hours as needed. Henderly, Britni A, PA-C Taking Active Self  Med Note Michaela Corner   Mon Aug 13, 2023 12:02 PM) 08/13/23- Reports during [facility]  call, has not needed recently  OneTouch Delica Lancets 30G MISC [id number] Yes USE 1  TO CHECK GLUCOSE ONCE DAILY Reather Littler, MD Taking Active Self  oxyCODONE (ROXICODONE) 5 MG immediate release tablet [id number] Yes Take 1 tablet (5 mg total) by mouth every 4 (four) hours as needed for severe pain (pain score 7-10). Henderly, Britni A, PA-C Taking Active Self  polyethylene glycol (MIRALAX / GLYCOLAX) 17 g packet [id number] Yes Take 17 g by mouth daily. Dorcas Carrow, MD Taking Active   senna-docusate (SENOKOT-S) 8.6-50 MG tablet [id number] Yes Take 1 tablet by mouth 2 (two) times daily. Dorcas Carrow, MD Taking Active   UNABLE TO FIND [id number] Yes Clindomycin ear drop 4 drops 3 x week to left ear on Monday Wednesday and friday [provider] Taking Active Self           Med Note Marilu Favre Aug 13, 2023 12:04 PM) 08/13/23- Reports during [facility] call, currently only taking one time per week            Home Care and Equipment/Supplies: Were Home Health Services Ordered?: No Any new equipment or medical supplies ordered?: No  Functional Questionnaire: Do you need assistance with bathing/showering or dressing?: No Do you need assistance with meal preparation?: No Do you need assistance with eating?: No Do you have difficulty maintaining continence: No Do you need assistance with getting out of bed/getting out of a chair/moving?: No Do you have difficulty managing or taking your medications?: No  Follow up appointments reviewed: PCP Follow-up appointment confirmed?: Yes (care coordination outreach in real-time with scheduling care guide to successfully schedule hospital follow up PCP appointment 08/17/23) Date of PCP follow-up appointment?: 08/17/23 Follow-up Provider: PCP Specialist Hospital Follow-up appointment confirmed?: Yes Date of Specialist follow-up appointment?: 08/31/23 (GI provider) Follow-Up Specialty Provider:: Gi provider on 08/31/23; surgical provider on  09/12/23 Do you need transportation to your follow-up appointment?: No Do you understand care options if your condition(s) worsen?: Yes-patient verbalized understanding  SDOH Interventions Today    Flowsheet Row Most Recent Value  SDOH Interventions   Food Insecurity Interventions Intervention Not Indicated  Transportation Interventions Intervention Not Indicated      Caryl Pina, RN, BSN, CCRN Alumnus RN Care Manager  Transitions of Care  [facility] Population [facility]  [facility] [phone]: direct office

## 2023-08-17 ENCOUNTER — Ambulatory Visit (INDEPENDENT_AMBULATORY_CARE_PROVIDER_SITE_OTHER): Payer: Medicare HMO | Admitting: Internal Medicine

## 2023-08-17 ENCOUNTER — Encounter: Payer: Self-pay | Admitting: Internal Medicine

## 2023-08-17 VITALS — BP 124/100 | HR 68 | Temp 98.4°F | Ht 66.0 in | Wt 159.0 lb

## 2023-08-17 DIAGNOSIS — E118 Type 2 diabetes mellitus with unspecified complications: Secondary | ICD-10-CM

## 2023-08-17 DIAGNOSIS — Z7984 Long term (current) use of oral hypoglycemic drugs: Secondary | ICD-10-CM

## 2023-08-17 DIAGNOSIS — K5732 Diverticulitis of large intestine without perforation or abscess without bleeding: Secondary | ICD-10-CM

## 2023-08-17 MED ORDER — ONETOUCH VERIO VI STRP: ORAL_STRIP | 1 refills | Status: DC

## 2023-08-17 NOTE — Progress Notes (Signed)
   Subjective:   Patient ID: Sonya Brady, female    DOB: 1951/05/27, 72 y.o.   MRN: [id number]  HPI The patient is a 72 YO female coming in for hospital follow up (was admitted with diverticulitis, CT with worsening findings despite outpatient treatment, treated with IV antibiotics and clinically improving). She will need follow up CT scan and since this is second episode this year she will meet with surgeon early December. Follow up GI later this month. She is eating and drinking well. No nausea or vomiting. Moving bowels normally. Rare pain with BM.   PMH, [facility], social history reviewed and updated, medication reconciliation done at visit  Review of Systems  Constitutional:  Positive for appetite change.  HENT: Negative.    Eyes: Negative.   Respiratory:  Negative for cough, chest tightness and shortness of breath.   Cardiovascular:  Negative for chest pain, palpitations and leg swelling.  Gastrointestinal:  Negative for abdominal distention, abdominal pain, constipation, diarrhea, nausea and vomiting.  Musculoskeletal: Negative.   Skin: Negative.   Neurological: Negative.   Psychiatric/Behavioral: Negative.      Objective:  Physical Exam Constitutional:      Appearance: She is well-developed.  HENT:     Head: Normocephalic and atraumatic.  Cardiovascular:     Rate and Rhythm: Normal rate and regular rhythm.  Pulmonary:     Effort: Pulmonary effort is normal. No respiratory distress.     Breath sounds: Normal breath sounds. No wheezing or rales.  Abdominal:     General: Bowel sounds are normal. There is no distension.     Palpations: Abdomen is soft.     Tenderness: There is no abdominal tenderness. There is no rebound.  Musculoskeletal:     Cervical back: Normal range of motion.  Skin:    General: Skin is warm and dry.  Neurological:     Mental Status: She is alert and oriented to person, place, and time.     Coordination: Coordination normal.     Vitals:   08/17/23  1407 08/17/23 1421  BP: (!) 124/100 (!) 124/100  Pulse: 68   Temp: 98.4 F (36.9 C)   TempSrc: Oral   SpO2: 98%   Weight: 159 lb (72.1 kg)   Height: 5\' 6"  (1.676 m)     Assessment & Plan:  Visit time 20 minutes in face to face communication with patient and coordination of care, additional 10 minutes spent in record review, coordination or care, ordering tests, communicating/referring to other healthcare professionals, documenting in medical records all on the same day of the visit for total time 30 minutes spent on the visit.

## 2023-08-17 NOTE — Assessment & Plan Note (Signed)
Complicated and appears to be resolving. Minimal pain (now rare with BM). Placed orders for CBC and CMP 2-3 weeks from now. Will need CT scan repeat and suspect about 1 month out would be appropriate. Will be seeing GI 08/31/23 so if they order fine if not I am happy to order if needed. Seeing surgeon to discuss elective surgery given 2 flares in last year.

## 2023-08-17 NOTE — Patient Instructions (Signed)
We have placed some labs to get done in 2-3 weeks.

## 2023-08-17 NOTE — Assessment & Plan Note (Signed)
Placed labs for 2-3 weeks HgA1c. Well controlled previously on metformin 1500 mg daily. Adjust as needed. On ACE-I and statin.

## 2023-08-28 ENCOUNTER — Other Ambulatory Visit (INDEPENDENT_AMBULATORY_CARE_PROVIDER_SITE_OTHER): Payer: Medicare HMO

## 2023-08-28 DIAGNOSIS — E118 Type 2 diabetes mellitus with unspecified complications: Secondary | ICD-10-CM | POA: Diagnosis not present

## 2023-08-28 LAB — COMPREHENSIVE METABOLIC PANEL
ALT: 17 U/L (ref 0–35)
AST: 19 U/L (ref 0–37)
Albumin: 4.5 g/dL (ref 3.5–5.2)
Alkaline Phosphatase: 77 U/L (ref 39–117)
BUN: 11 mg/dL (ref 6–23)
CO2: 28 meq/L (ref 19–32)
Calcium: 10 mg/dL (ref 8.4–10.5)
Chloride: 101 meq/L (ref 96–112)
Creatinine, Ser: 0.7 mg/dL (ref 0.40–1.20)
GFR: 85.52 mL/min (ref >60.00)
Glucose, Bld: 95 mg/dL (ref 70–99)
Potassium: 4.1 meq/L (ref 3.5–5.1)
Sodium: 137 meq/L (ref 135–145)
Total Bilirubin: 0.6 mg/dL (ref 0.2–1.2)
Total Protein: 7.6 g/dL (ref 6.0–8.3)

## 2023-08-28 LAB — CBC
HCT: 39.2 % (ref 36.0–46.0)
Hemoglobin: 12.7 g/dL (ref 12.0–15.0)
MCHC: 32.5 g/dL (ref 30.0–36.0)
MCV: 90.2 fL (ref 78.0–100.0)
Platelets: 293 10*3/uL (ref 150.0–400.0)
RBC: 4.35 Mil/uL (ref 3.87–5.11)
RDW: 14 % (ref 11.5–15.5)
WBC: 5.5 10*3/uL (ref 4.0–10.5)

## 2023-08-28 LAB — HEMOGLOBIN A1C: Hgb A1c MFr Bld: 6.2 % (ref 4.6–6.5)

## 2023-08-31 ENCOUNTER — Ambulatory Visit: Payer: Medicare HMO | Admitting: Physician Assistant

## 2023-08-31 ENCOUNTER — Encounter: Payer: Self-pay | Admitting: Physician Assistant

## 2023-08-31 VITALS — BP 124/68 | HR 75 | Ht 66.0 in | Wt 159.2 lb

## 2023-08-31 DIAGNOSIS — K5792 Diverticulitis of intestine, part unspecified, without perforation or abscess without bleeding: Secondary | ICD-10-CM | POA: Diagnosis not present

## 2023-08-31 DIAGNOSIS — M79672 Pain in left foot: Secondary | ICD-10-CM | POA: Insufficient documentation

## 2023-08-31 MED ORDER — NA SULFATE-K SULFATE-MG SULF 17.5-3.13-1.6 GM/177ML PO SOLN: 1.0000 | ORAL | 0 refills | Status: DC

## 2023-08-31 NOTE — Patient Instructions (Addendum)
_______________________________________________________  If your blood pressure at your visit was 140/90 or greater, please contact your primary care physician to follow up on this.  _______________________________________________________  If you are age 73 or older, your body mass index should be between 23-30. Your Body mass index is 25.7 kg/m. If this is out of the aforementioned range listed, please consider follow up with your Primary Care Provider.  If you are age 64 or younger, your body mass index should be between 19-25. Your Body mass index is 25.7 kg/m. If this is out of the aformentioned range listed, please consider follow up with your Primary Care Provider.   ________________________________________________________  The [address] [facility] providers would like to encourage you to use [facility] to communicate with providers for non-urgent requests or questions.  Due to long hold times on the telephone, sending your provider a message by [facility] may be a faster and more efficient way to get a response.  Please allow 48 business hours for a response.  Please remember that this is for non-urgent requests.  _______________________________________________________  Sonya Brady have been scheduled for a colonoscopy. Please follow written instructions given to you at your visit today.   Please pick up your prep supplies at the pharmacy within the next 1-3 days.  If you use inhalers (even only as needed), please bring them with you on the day of your procedure.  DO NOT TAKE 7 DAYS PRIOR TO TEST- Trulicity (dulaglutide) Ozempic, Wegovy (semaglutide) Mounjaro (tirzepatide) Bydureon Bcise (exanatide extended release)  DO NOT TAKE 1 DAY PRIOR TO YOUR TEST Rybelsus (semaglutide) Adlyxin (lixisenatide) Victoza (liraglutide) Byetta (exanatide) ___________________________________________________________________________  Due to recent changes in healthcare laws, you may see the results of your imaging  and laboratory studies on MyChart before your provider has had a chance to review them.  We understand that in some cases there may be results that are confusing or concerning to you. Not all laboratory results come back in the same time frame and the provider may be waiting for multiple results in order to interpret others.  Please give [place] 48 hours in order for your provider to thoroughly review all the results before contacting the office for clarification of your results.   Thank you for entrusting me with your care and choosing [facility].  [name], PA-C

## 2023-08-31 NOTE — Progress Notes (Signed)
Chief Complaint: Follow-up after recurrent diverticulitis  HPI:    Sonya Brady is a 72 year old female, know to Dr. Marina Goodell, with a past medical history as listed below including reflux, GERD, type 2 diabetes and diverticulosis complicated by diverticulitis, who presents to clinic today for follow-up after recurrent diverticulitis and hospitalization.    05/10/2023 office visit with Dr. Rhea Belton to discuss GERD complicated by peptic stricture, colon cancer screening and a history of acute diverticulitis.  At that time asymptomatic.  Continued on Omeprazole 20 mg daily for GERD.  Discussed last colonoscopy April 2018.    08/07/2023 patient admitted to the hospital for recurrent diverticulitis.  CT showed worsening acute uncomplicated diverticulitis of the sigmoid colon.  Patient given IV Zosyn.  Recommended to follow-up colonoscopy as outpatient in 6 to 8 weeks after resolution of acute diverticulitis.  Prior to elective resection.  (December 10 would be 6 weeks out)    08/17/2023 patient seen by PCP and at that time symptoms were resolving.  Discussed repeat CT scan a month out.    Today, patient presents to clinic and tells me that all of her symptoms are gone.  She has not 1 to have to go through this again and therefore has an appointment scheduled with the surgical team in early December to discuss elective partial colectomy.  Currently maintaining regular bowel movements with a mixture of MiraLAX and Senokot.  Tells me she finished her antibiotics on November 4 or 5.  She would like to get a colonoscopy so that she can then have surgery.    Her son is Dr. Frederick Peers, the hospitalist.    Denies fever, chills or weight loss.  Previous GI workup: 08/04/2023 CT abdomen/pelvis IMPRESSION: 1. Pancolonic diverticulosis. Relatively focal circumferential wall thickening and adjacent fat stranding of the mid sigmoid colon, segment 5 cm in length, consistent with acute uncomplicated diverticulitis. Underlying  mass not excluded. Consider colonoscopy at the resolution of acute clinical presentation to exclude underlying mass. 2. Cholelithiasis without evidence of acute cholecystitis. 3. Coronary artery disease. 08/06/2023 CT abdomen/pelvis IMPRESSION: 1. Worsening acute uncomplicated diverticulitis of the sigmoid colon. No perforation, fluid collection, or abscess. Follow-up recommended to document resolution and exclude underlying colonic mass. 2. Cholelithiasis without cholecystitis. 3.  Aortic Atherosclerosis (ICD10-I70.0).   01/23/2017 upper endoscopy and colonoscopy   EGD- Dysphagia. History of esophageal stricture with previous dilation ( Dr. Jarold Motto)   Impression: - Benign- appearing esophageal stenosis. Dilated. - Normal stomach. - Normal examined duodenum. - No specimens collected.   Colonoscopy -Screening for colorectal malignant neoplasm. Negative index examination 2007 ( Dr. Jarold Motto)   Impression: - Two 1 to 2 mm polyps in the sigmoid colon and in the proximal transverse colon, removed with a cold snare. Resected and retrieved. - Diverticulosis in the left colon and in the right colon. - The examination was otherwise normal on direct and retroflexion views.   1. Surgical [P], transverse, polyp - BENIGN POLYPOID COLORECTAL MUCOSA. - THERE IS NO EVIDENCE OF MALIGNANCY. 2. Surgical [P], sigmoid, polyp - HYPERPLASTIC POLYP(S). - THERE IS NO EVIDENCE OF MALIGNANCY. Past Medical History:  Diagnosis Date   Acne    Allergy    Arthritis    Carpal tunnel syndrome of left wrist 10/21/2021   Cataract    Complication of anesthesia    could hear sounds with r breast biopsy yrs ago,   Depression    situational when husband was sick and dying.   Diabetes mellitus Type 2    Diverticulosis  GERD (gastroesophageal reflux disease)    Glaucoma    Goiter    benign   right 5.7 cm/3.0x1.8cm left 5.2 x 2.4 x 2.1 cm per 05-09-2019 [place] epic no further follow up needed   H. pylori  infection    Heart murmur    History of COVID-19 2020   sick x 1 week all symptoms resolved   Hypercholesteremia    Hypertension    Hypothyroidism    Insomnia    PONV (postoperative nausea and vomiting)    x 1 yrs ago per pt on 10-24-2021   Tinnitus, left ear 10/24/2021   for last 30 yrs   Unspecified Eustachian tube disorder, left ear     Past Surgical History:  Procedure Laterality Date   ABDOMINAL HYSTERECTOMY     both ovary intact age 22   BREAST LUMPECTOMY Bilateral    benign mmore than 35 yrs ago   CARPAL TUNNEL RELEASE Left 10/27/2021   Procedure: CARPAL TUNNEL RELEASE;  Surgeon: Gomez Cleverly, MD;  Location: [facility] [place];  Service: Orthopedics;  Laterality: Left;   carpel tunnel surgery Right    right 30 yrs ago per pt on 10-24-2021   CARPOMETACARPEL SUSPENSION PLASTY Left 10/27/2021   Procedure: left thumb carpometacarpal joint arthroplasty with tendon transfer;  Surgeon: Gomez Cleverly, MD;  Location: [facility];  Service: Orthopedics;  Laterality: Left;  with MAC   EXCISION MASS UPPER EXTREMETIES Right 06/05/2022   Procedure: EXCISION MASS RIGHT SHOULDER;  Surgeon: Luretha Murphy, MD;  Location:  [facility];  Service: General;  Laterality: Right;   EYE SURGERY     cataracts yrs ago per pt on 10-24-2021   left ear eardrum reconstruction     ponv after 40 yrs ago   right lower abdominal mass benign removed  1978   TYMPANOPLASTY     tube left ear multiple times ober last 44 years   UPPER GI ENDOSCOPY     2014 and 2018    Current Outpatient Medications  Medication Sig Dispense Refill   atorvastatin (LIPITOR) 80 MG tablet Take 1 tablet (80 mg total) by mouth daily. 90 tablet 3   Blood Glucose Monitoring Suppl (ONETOUCH VERIO) w/Device KIT Use As Directed 1 kit 0   dorzolamide-timolol (COSOPT) 2-0.5 % ophthalmic solution Place 1 drop into both eyes 2 (two) times daily.     glucose blood (ONETOUCH VERIO) test strip USE 1 STRIP  TO CHECK GLUCOSE ONCE DAILY 75 each 1   latanoprost (XALATAN) 0.005 % ophthalmic solution Place 1 drop into both eyes at bedtime.     levothyroxine (SYNTHROID) 112 MCG tablet Take 1 tablet (112 mcg total) by mouth daily before breakfast. 90 tablet 3   lisinopril-hydrochlorothiazide (ZESTORETIC) 20-25 MG tablet Take 1 tablet by mouth daily. 90 tablet 3   Melatonin 10 MG TABS Take 1 tablet by mouth at bedtime as needed.     metFORMIN (GLUCOPHAGE-XR) 500 MG 24 hr tablet Take 3 tablets (1,500 mg total) by mouth daily with breakfast. 90 tablet 5   montelukast (SINGULAIR) 10 MG tablet Take 10 mg by mouth at bedtime.     Multiple Vitamins-Minerals (MULTIVITAMIN PO) Take 1 tablet by mouth daily.     omeprazole (PRILOSEC) 20 MG capsule Take 1 capsule (20 mg total) by mouth daily. 90 capsule 3   ondansetron (ZOFRAN-ODT) 4 MG disintegrating tablet Take 1 tablet (4 mg total) by mouth every 8 (eight) hours as needed. 20 tablet 0   OneTouch Delica  Lancets 30G MISC USE 1  TO CHECK GLUCOSE ONCE DAILY 100 each 0   oxyCODONE (ROXICODONE) 5 MG immediate release tablet Take 1 tablet (5 mg total) by mouth every 4 (four) hours as needed for severe pain (pain score 7-10). 15 tablet 0   polyethylene glycol (MIRALAX / GLYCOLAX) 17 g packet Take 17 g by mouth daily. 14 each 0   senna-docusate (SENOKOT-S) 8.6-50 MG tablet Take 1 tablet by mouth 2 (two) times daily. 60 tablet 0   UNABLE TO FIND Clindomycin ear drop 4 drops 3 x week to left ear on Monday Wednesday and friday     No current facility-administered medications for this visit.    Allergies as of 08/31/2023 - Review Complete 08/17/2023  Allergen Reaction Noted   Flagyl [metronidazole]  10/24/2021   No known allergies  07/08/2015    Family History  Problem Relation Age of Onset   Other Father 63       deceased, unknown causes in Oct 09, 2023   Hypertension Mother    Other Mother 2       SBO, deceased   Stroke Mother    Thyroid disease Brother    Cancer  Brother    Thyroid disease Sister    Cancer Sister    Breast cancer Neg Hx    Colon cancer Neg Hx    Esophageal cancer Neg Hx    Rectal cancer Neg Hx    Stomach cancer Neg Hx    Diabetes Neg Hx     Social History   Socioeconomic History   Marital status: Single    Spouse name: Not on file   Number of children: 2   Years of education: Not on file   Highest education level: Not on file  Occupational History   Occupation: bookkeeper  Tobacco Use   Smoking status: Never   Smokeless tobacco: Never  Vaping Use   Vaping status: Never Used  Substance and Sexual Activity   Alcohol use: Not Currently    Comment: social   Drug use: Never   Sexual activity: Not Currently    Birth control/protection: Abstinence  Other Topics Concern   Not on file  Social History Narrative   Married '72-widowed 1996-10-08   HSG   1 daughter Oct 08, 1980, 1 son '47: son going thru divorce ['09]; applying to medical school   Work: bookkeeper [employer]   Social Determinants of Health   Financial Resource Strain: Low Risk  (02/12/2023)   Overall Financial Resource Strain (CARDIA)    Difficulty of Paying Living Expenses: Not hard at all  Food Insecurity: No Food Insecurity (08/13/2023)   Hunger Vital Sign    Worried About Running Out of Food in the Last Year: Never true    Ran Out of Food in the Last Year: Never true  Transportation Needs: No Transportation Needs (08/13/2023)   PRAPARE - Administrator, Civil Service (Medical): No    Lack of Transportation (Non-Medical): No  Physical Activity: Unknown (02/12/2023)   Exercise Vital Sign    Days of Exercise per Week: 4 days    Minutes of Exercise per Session: Patient declined  Stress: No Stress Concern Present (02/12/2023)   [employer] of Occupational Health - Occupational Stress Questionnaire    Feeling of Stress : Not at all  Social Connections: Moderately Isolated (02/12/2023)   Social Connection and Isolation Panel [NHANES]    Frequency of  Communication with Friends and Family: More than three times a week  Frequency of Social Gatherings with Friends and Family: More than three times a week    Attends Religious Services: 1 to 4 times per year    Active Member of [employer] or Organizations: No    Attends Banker Meetings: Not on file    Marital Status: Widowed  Intimate Partner Violence: Not At Risk (08/07/2023)   Humiliation, Afraid, Rape, and Kick questionnaire    Fear of Current or Ex-Partner: No    Emotionally Abused: No    Physically Abused: No    Sexually Abused: No    Review of Systems:    Constitutional: No weight loss, fever or chills Cardiovascular: No chest pain   Respiratory: No SOB  Gastrointestinal: See HPI and otherwise negative   Physical Exam:  Vital signs: BP 124/68 (BP Location: Left Arm, Patient Position: Sitting, Cuff Size: Normal)   Pulse 75   Ht 5\' 6"  (1.676 m)   Wt 159 lb 4 oz (72.2 kg)   SpO2 96%   BMI 25.70 kg/m    Constitutional:   Pleasant elderly female appears to be in NAD, Well developed, Well nourished, alert and cooperative Respiratory: Respirations even and unlabored. Lungs clear to auscultation bilaterally.   No wheezes, crackles, or rhonchi.  Cardiovascular: Normal S1, S2. No MRG. Regular rate and rhythm. No peripheral edema, cyanosis or pallor.  Gastrointestinal:  Soft, nondistended, nontender. No rebound or guarding. Normal bowel sounds. No appreciable masses or hepatomegaly. Rectal:  Not performed.  Psychiatric:  Demonstrates good judgement and reason without abnormal affect or behaviors.  RELEVANT LABS AND IMAGING: CBC    Component Value Date/Time   WBC 5.5 08/28/2023 1248   RBC 4.35 08/28/2023 1248   HGB 12.7 08/28/2023 1248   HCT 39.2 08/28/2023 1248   PLT 293.0 08/28/2023 1248   MCV 90.2 08/28/2023 1248   MCH 29.9 08/09/2023 0441   MCHC 32.5 08/28/2023 1248   RDW 14.0 08/28/2023 1248   LYMPHSABS 3.2 06/15/2016 1304   MONOABS 0.7 06/15/2016 1304    EOSABS 0.1 06/15/2016 1304   BASOSABS 0.0 06/15/2016 1304    CMP     Component Value Date/Time   NA 137 08/28/2023 1248   K 4.1 08/28/2023 1248   CL 101 08/28/2023 1248   CO2 28 08/28/2023 1248   GLUCOSE 95 08/28/2023 1248   BUN 11 08/28/2023 1248   CREATININE 0.70 08/28/2023 1248   CALCIUM 10.0 08/28/2023 1248   PROT 7.6 08/28/2023 1248   ALBUMIN 4.5 08/28/2023 1248   AST 19 08/28/2023 1248   ALT 17 08/28/2023 1248   ALKPHOS 77 08/28/2023 1248   BILITOT 0.6 08/28/2023 1248   GFRNONAA >60 08/09/2023 0441   GFRAA >60 11/01/2019 1547    Assessment: 1.  Recurrent diverticulitis: Multiple hospitalizations requiring IV antibiotics for diverticulitis, the last in October, patient is now recovered with her last dose of antibiotics in November 4 or 5 per her, considering elective partial colectomy to avoid symptoms in the future, has appointment with surgical team in early December to discuss further and would like a colonoscopy  Plan: 1.  Scheduled patient for a diagnostic colonoscopy in the [facility] with Dr. Marina Goodell.  We will wait at least 6 weeks from patient finishing her antibiotics to schedule this.  Did provide the patient a detailed list of risks for the procedure and she agrees to proceed. Patient is appropriate for endoscopic procedure(s) in the ambulatory ([facility]) setting.  2.  Patient to follow in clinic per recommendations after time of  procedure.  [name], PA-C [facility] 08/31/2023, 10:56 AM  Cc: Myrlene Broker, *

## 2023-09-01 NOTE — Progress Notes (Signed)
Noted  

## 2023-09-11 ENCOUNTER — Other Ambulatory Visit: Payer: Self-pay

## 2023-09-11 ENCOUNTER — Telehealth: Payer: Self-pay | Admitting: Internal Medicine

## 2023-09-11 MED ORDER — METFORMIN HCL ER 500 MG PO TB24: 1500.0000 mg | ORAL_TABLET | Freq: Every day | ORAL | 5 refills | Status: DC

## 2023-09-11 NOTE — Telephone Encounter (Signed)
Prescription Request  09/11/2023  LOV: 08/17/2023  What is the name of the medication or equipment? Metformin xr 500   Have you contacted your pharmacy to request a refill? Yes   Which pharmacy would you like this sent to?  [facility] [address], [place] - [address] [place] [address] Phone: [phone] Fax: [phone]  W   Patient notified that their request is being sent to the clinical staff for review and that they should receive a response within 2 business days.   Please advise at Mobile [phone] (mobile)

## 2023-09-12 DIAGNOSIS — H6522 Chronic serous otitis media, left ear: Secondary | ICD-10-CM | POA: Diagnosis not present

## 2023-09-12 DIAGNOSIS — K5732 Diverticulitis of large intestine without perforation or abscess without bleeding: Secondary | ICD-10-CM | POA: Diagnosis not present

## 2023-09-12 DIAGNOSIS — H7312 Chronic myringitis, left ear: Secondary | ICD-10-CM | POA: Diagnosis not present

## 2023-09-20 DIAGNOSIS — M79672 Pain in left foot: Secondary | ICD-10-CM | POA: Diagnosis not present

## 2023-10-08 NOTE — Telephone Encounter (Signed)
Patient still have refills not understating why she was give a 1 month supply.

## 2023-10-08 NOTE — Telephone Encounter (Signed)
Copied from CRM [id number]. Topic: Clinical - Prescription Issue >> Oct 08, 2023 12:16 PM Sonya Brady wrote:  Reason for CRM: Pt picked up Rx refill today, for the metFORMIN (GLUCOPHAGE-XR) 500 MG 24 hr tablet , she was previously given a 3 month supply, now she was only given a 1  month supply and she is just trying to figure out why it has changed

## 2023-10-11 DIAGNOSIS — H40013 Open angle with borderline findings, low risk, bilateral: Secondary | ICD-10-CM | POA: Diagnosis not present

## 2023-10-11 DIAGNOSIS — E119 Type 2 diabetes mellitus without complications: Secondary | ICD-10-CM | POA: Diagnosis not present

## 2023-10-11 DIAGNOSIS — H04123 Dry eye syndrome of bilateral lacrimal glands: Secondary | ICD-10-CM | POA: Diagnosis not present

## 2023-10-11 DIAGNOSIS — H52203 Unspecified astigmatism, bilateral: Secondary | ICD-10-CM | POA: Diagnosis not present

## 2023-10-11 DIAGNOSIS — H43813 Vitreous degeneration, bilateral: Secondary | ICD-10-CM | POA: Diagnosis not present

## 2023-10-11 LAB — HM DIABETES EYE EXAM

## 2023-10-12 ENCOUNTER — Ambulatory Visit: Payer: Medicare HMO | Admitting: Internal Medicine

## 2023-10-16 ENCOUNTER — Encounter: Payer: Self-pay | Admitting: Internal Medicine

## 2023-10-22 ENCOUNTER — Telehealth: Payer: Self-pay | Admitting: Internal Medicine

## 2023-10-22 NOTE — Telephone Encounter (Signed)
 Patient needing prep assistance for tomorrows procedure. Please advise.   Thank you

## 2023-10-22 NOTE — Telephone Encounter (Signed)
 Returned pt's call. Pt had questions about taking medications prior to the procedure, specifically Lisinopril  and Levothyroxine . Instructed pt that she could take those medications in the morning on the day of the procedure as long as she takes them by 11am. Instructed pt that we would want her to take her Lisinopril . Pt verbalized understanding.

## 2023-10-23 ENCOUNTER — Ambulatory Visit (AMBULATORY_SURGERY_CENTER): Payer: Medicare HMO | Admitting: Internal Medicine

## 2023-10-23 ENCOUNTER — Encounter: Payer: Self-pay | Admitting: Internal Medicine

## 2023-10-23 VITALS — BP 104/56 | HR 76 | Temp 98.2°F | Resp 12 | Ht 66.0 in | Wt 159.0 lb

## 2023-10-23 DIAGNOSIS — K5792 Diverticulitis of intestine, part unspecified, without perforation or abscess without bleeding: Secondary | ICD-10-CM

## 2023-10-23 DIAGNOSIS — E039 Hypothyroidism, unspecified: Secondary | ICD-10-CM | POA: Diagnosis not present

## 2023-10-23 DIAGNOSIS — K5730 Diverticulosis of large intestine without perforation or abscess without bleeding: Secondary | ICD-10-CM | POA: Diagnosis not present

## 2023-10-23 DIAGNOSIS — I10 Essential (primary) hypertension: Secondary | ICD-10-CM | POA: Diagnosis not present

## 2023-10-23 DIAGNOSIS — E119 Type 2 diabetes mellitus without complications: Secondary | ICD-10-CM | POA: Diagnosis not present

## 2023-10-23 MED ORDER — SODIUM CHLORIDE 0.9 % IV SOLN: 500.0000 mL | Freq: Once | INTRAVENOUS | Status: DC

## 2023-10-23 NOTE — Progress Notes (Signed)
 Pt's states no medical or surgical changes since previsit or office visit.

## 2023-10-23 NOTE — Progress Notes (Signed)
 Report to PACU, RN, vss, BBS= Clear.

## 2023-10-23 NOTE — Op Note (Signed)
 [facility] Patient Name: Sonya Brady Procedure Date: 10/23/2023 1:28 PM MRN: [id number] Endoscopist: Norleen SAILOR. Abran , MD, [id number] Age: 73 Referring MD:  Date of Birth: 1950-12-06 Gender: Female Account #: [id number] Procedure:                Colonoscopy Indications:              Abnormal CT of the GI tract, history of recurrent                            diverticulitis for which she is anticipating                            surgery next month. Preop colonoscopy requested.                            Prior colonoscopies 2007 and 2018 were negative for                            neoplasia Medicines:                Monitored Anesthesia Care Procedure:                Pre-Anesthesia Assessment:                           - Prior to the procedure, a History and Physical                            was performed, and patient medications and                            allergies were reviewed. The patient's tolerance of                            previous anesthesia was also reviewed. The risks                            and benefits of the procedure and the sedation                            options and risks were discussed with the patient.                            All questions were answered, and informed consent                            was obtained. Prior Anticoagulants: The patient has                            taken no anticoagulant or antiplatelet agents. ASA                            Grade Assessment: II - A patient with mild systemic  disease. After reviewing the risks and benefits,                            the patient was deemed in satisfactory condition to                            undergo the procedure.                           After obtaining informed consent, the colonoscope                            was passed under direct vision. Throughout the                            procedure, the patient's blood pressure, pulse, and                             oxygen saturations were monitored continuously. The                            CF [id number] #[id number] was introduced through the anus                            and advanced to the the cecum, identified by                            appendiceal orifice and ileocecal valve. The                            ileocecal valve, appendiceal orifice, and rectum                            were photographed. The quality of the bowel                            preparation was excellent. The colonoscopy was                            performed without difficulty. The patient tolerated                            the procedure well. The bowel preparation used was                            SUPREP via split dose instruction. Scope In: 1:44:18 PM Scope Out: 1:53:37 PM Scope Withdrawal Time: 0 hours 6 minutes 35 seconds  Total Procedure Duration: 0 hours 9 minutes 19 seconds  Findings:                 Multiple diverticula were found in the left colon                            and right colon.  The exam was otherwise without abnormality on                            direct and retroflexion views. Complications:            No immediate complications. Estimated blood loss:                            None. Estimated Blood Loss:     Estimated blood loss: none. Impression:               - Diverticulosis in the left colon and in the right                            colon.                           - The examination was otherwise normal on direct                            and retroflexion views.                           - No specimens collected. Recommendation:           - Repeat colonoscopy is not recommended for                            screening purposes.                           - Patient has a contact number available for                            emergencies. The signs and symptoms of potential                            delayed complications were discussed  with the                            patient. Return to normal activities tomorrow.                            Written discharge instructions were provided to the                            patient.                           - Resume previous diet.                           - Continue present medications.                           -Follow-up with Dr. Teresa as planned Norleen SAILOR. Abran, MD 10/23/2023 1:59:21 PM This report has been signed electronically.

## 2023-10-23 NOTE — Progress Notes (Signed)
 Expand All Collapse All    Chief Complaint: Follow-up after recurrent diverticulitis   HPI:    Sonya Brady is a 73 year old female, know to Dr. Abran, with a past medical history as listed below including reflux, GERD, type 2 diabetes and diverticulosis complicated by diverticulitis, who presents to clinic today for follow-up after recurrent diverticulitis and hospitalization.    05/10/2023 office visit with Dr. Albertus to discuss GERD complicated by peptic stricture, colon cancer screening and a history of acute diverticulitis.  At that time asymptomatic.  Continued on Omeprazole  20 mg daily for GERD.  Discussed last colonoscopy April 2018.    08/07/2023 patient admitted to the hospital for recurrent diverticulitis.  CT showed worsening acute uncomplicated diverticulitis of the sigmoid colon.  Patient given IV Zosyn .  Recommended to follow-up colonoscopy as outpatient in 6 to 8 weeks after resolution of acute diverticulitis.  Prior to elective resection.  (December 10 would be 6 weeks out)    08/17/2023 patient seen by PCP and at that time symptoms were resolving.  Discussed repeat CT scan a month out.    Today, patient presents to clinic and tells me that all of her symptoms are gone.  She has not 1 to have to go through this again and therefore has an appointment scheduled with the surgical team in early December to discuss elective partial colectomy.  Currently maintaining regular bowel movements with a mixture of MiraLAX  and Senokot.  Tells me she finished her antibiotics on November 4 or 5.  She would like to get a colonoscopy so that she can then have surgery.    Her son is Dr. Patsy, the hospitalist.    Denies fever, chills or weight loss.   Previous GI workup: 08/04/2023 CT abdomen/pelvis IMPRESSION: 1. Pancolonic diverticulosis. Relatively focal circumferential wall thickening and adjacent fat stranding of the mid sigmoid colon, segment 5 cm in length, consistent with acute  uncomplicated diverticulitis. Underlying mass not excluded. Consider colonoscopy at the resolution of acute clinical presentation to exclude underlying mass. 2. Cholelithiasis without evidence of acute cholecystitis. 3. Coronary artery disease. 08/06/2023 CT abdomen/pelvis IMPRESSION: 1. Worsening acute uncomplicated diverticulitis of the sigmoid colon. No perforation, fluid collection, or abscess. Follow-up recommended to document resolution and exclude underlying colonic mass. 2. Cholelithiasis without cholecystitis. 3.  Aortic Atherosclerosis (ICD10-I70.0).   01/23/2017 upper endoscopy and colonoscopy   EGD- Dysphagia. History of esophageal stricture with previous dilation ( Dr. Jakie)   Impression: - Benign- appearing esophageal stenosis. Dilated. - Normal stomach. - Normal examined duodenum. - No specimens collected.   Colonoscopy -Screening for colorectal malignant neoplasm. Negative index examination 2007 ( Dr. Jakie)   Impression: - Two 1 to 2 mm polyps in the sigmoid colon and in the proximal transverse colon, removed with a cold snare. Resected and retrieved. - Diverticulosis in the left colon and in the right colon. - The examination was otherwise normal on direct and retroflexion views.   1. Surgical [P], transverse, polyp - BENIGN POLYPOID COLORECTAL MUCOSA. - THERE IS NO EVIDENCE OF MALIGNANCY. 2. Surgical [P], sigmoid, polyp - HYPERPLASTIC POLYP(S). - THERE IS NO EVIDENCE OF MALIGNANCY.     Past Medical History:  Diagnosis Date   Acne     Allergy     Arthritis     Carpal tunnel syndrome of left wrist 10/21/2021   Cataract     Complication of anesthesia      could hear sounds with r breast biopsy yrs ago,   Depression  situational when husband was sick and dying.   Diabetes mellitus Type 2     Diverticulosis     GERD (gastroesophageal reflux disease)     Glaucoma     Goiter      benign   right 5.7 cm/3.0x1.8cm left 5.2 x 2.4 x 2.1 cm per  05-09-2019 us  epic no further follow up needed   H. pylori infection     Heart murmur     History of COVID-19 2020    sick x 1 week all symptoms resolved   Hypercholesteremia     Hypertension     Hypothyroidism     Insomnia     PONV (postoperative nausea and vomiting)      x 1 yrs ago per pt on 10-24-2021   Tinnitus, left ear 10/24/2021    for last 30 yrs   Unspecified Eustachian tube disorder, left ear                 Past Surgical History:  Procedure Laterality Date   ABDOMINAL HYSTERECTOMY        both ovary intact age 55   BREAST LUMPECTOMY Bilateral      benign mmore than 35 yrs ago   CARPAL TUNNEL RELEASE Left 10/27/2021    Procedure: CARPAL TUNNEL RELEASE;  Surgeon: Alyse Agent, MD;  Location: [facility] [place];  Service: Orthopedics;  Laterality: Left;   carpel tunnel surgery Right      right 30 yrs ago per pt on 10-24-2021   CARPOMETACARPEL SUSPENSION PLASTY Left 10/27/2021    Procedure: left thumb carpometacarpal joint arthroplasty with tendon transfer;  Surgeon: Alyse Agent, MD;  Location: [facility];  Service: Orthopedics;  Laterality: Left;  with MAC   EXCISION MASS UPPER EXTREMETIES Right 06/05/2022    Procedure: EXCISION MASS RIGHT SHOULDER;  Surgeon: Gladis Cough, MD;  Location: [facility];  Service: General;  Laterality: Right;   EYE SURGERY        cataracts yrs ago per pt on 10-24-2021   left ear eardrum reconstruction        ponv after 40 yrs ago   right lower abdominal mass benign removed   1978   TYMPANOPLASTY        tube left ear multiple times ober last 44 years   UPPER GI ENDOSCOPY        2014 and 2018                Current Outpatient Medications  Medication Sig Dispense Refill   atorvastatin  (LIPITOR) 80 MG tablet Take 1 tablet (80 mg total) by mouth daily. 90 tablet 3   Blood Glucose Monitoring Suppl (ONETOUCH VERIO) w/Device KIT Use As Directed 1 kit 0   dorzolamide -timolol  (COSOPT ) 2-0.5 %  ophthalmic solution Place 1 drop into both eyes 2 (two) times daily.       glucose blood (ONETOUCH VERIO) test strip USE 1 STRIP TO CHECK GLUCOSE ONCE DAILY 75 each 1   latanoprost  (XALATAN ) 0.005 % ophthalmic solution Place 1 drop into both eyes at bedtime.       levothyroxine  (SYNTHROID ) 112 MCG tablet Take 1 tablet (112 mcg total) by mouth daily before breakfast. 90 tablet 3   lisinopril -hydrochlorothiazide  (ZESTORETIC ) 20-25 MG tablet Take 1 tablet by mouth daily. 90 tablet 3   Melatonin 10 MG TABS Take 1 tablet by mouth at bedtime as needed.       metFORMIN  (GLUCOPHAGE -XR) 500 MG 24 hr tablet Take 3 tablets (  1,500 mg total) by mouth daily with breakfast. 90 tablet 5   montelukast  (SINGULAIR ) 10 MG tablet Take 10 mg by mouth at bedtime.       Multiple Vitamins-Minerals (MULTIVITAMIN PO) Take 1 tablet by mouth daily.       omeprazole  (PRILOSEC) 20 MG capsule Take 1 capsule (20 mg total) by mouth daily. 90 capsule 3   ondansetron  (ZOFRAN -ODT) 4 MG disintegrating tablet Take 1 tablet (4 mg total) by mouth every 8 (eight) hours as needed. 20 tablet 0   OneTouch Delica Lancets 30G MISC USE 1  TO CHECK GLUCOSE ONCE DAILY 100 each 0   oxyCODONE  (ROXICODONE ) 5 MG immediate release tablet Take 1 tablet (5 mg total) by mouth every 4 (four) hours as needed for severe pain (pain score 7-10). 15 tablet 0   polyethylene glycol (MIRALAX  / GLYCOLAX ) 17 g packet Take 17 g by mouth daily. 14 each 0   senna-docusate (SENOKOT-S) 8.6-50 MG tablet Take 1 tablet by mouth 2 (two) times daily. 60 tablet 0   UNABLE TO FIND Clindomycin ear drop 4 drops 3 x week to left ear on Monday Wednesday and friday          No current facility-administered medications for this visit.             Allergies as of 08/31/2023 - Review Complete 08/17/2023  Allergen Reaction Noted   Flagyl  [metronidazole ]   10/24/2021   No known allergies   07/08/2015           Family History  Problem Relation Age of Onset   Other Father 71         deceased, unknown causes in Sep 22, 2024   Hypertension Mother     Other Mother 50        SBO, deceased   Stroke Mother     Thyroid  disease Brother     Cancer Brother     Thyroid  disease Sister     Cancer Sister     Breast cancer Neg Hx     Colon cancer Neg Hx     Esophageal cancer Neg Hx     Rectal cancer Neg Hx     Stomach cancer Neg Hx     Diabetes Neg Hx            Social History         Socioeconomic History   Marital status: Single      Spouse name: Not on file   Number of children: 2   Years of education: Not on file   Highest education level: Not on file  Occupational History   Occupation: bookkeeper  Tobacco Use   Smoking status: Never   Smokeless tobacco: Never  Vaping Use   Vaping status: Never Used  Substance and Sexual Activity   Alcohol use: Not Currently      Comment: social   Drug use: Never   Sexual activity: Not Currently      Birth control/protection: Abstinence  Other Topics Concern   Not on file  Social History Narrative    Married '72-widowed 22-Sep-1996    HSG    1 daughter 09/22/80, 1 son '71: son going thru divorce ['09]; applying to medical school    Work: bookkeeper [employer]    Social Determinants of Health        Financial Resource Strain: Low Risk  (02/12/2023)    Overall Financial Resource Strain (CARDIA)     Difficulty of Paying Living Expenses: Not hard  at all  Food Insecurity: No Food Insecurity (08/13/2023)    Hunger Vital Sign     Worried About Running Out of Food in the Last Year: Never true     Ran Out of Food in the Last Year: Never true  Transportation Needs: No Transportation Needs (08/13/2023)    PRAPARE - Therapist, Art (Medical): No     Lack of Transportation (Non-Medical): No  Physical Activity: Unknown (02/12/2023)    Exercise Vital Sign     Days of Exercise per Week: 4 days     Minutes of Exercise per Session: Patient declined  Stress: No Stress Concern Present (02/12/2023)    [employer] of Occupational [employer] - Occupational Stress Questionnaire     Feeling of Stress : Not at all  Social Connections: Moderately Isolated (02/12/2023)    Social Connection and Isolation Panel [NHANES]     Frequency of Communication with Friends and Family: More than three times a week     Frequency of Social Gatherings with Friends and Family: More than three times a week     Attends Religious Services: 1 to 4 times per year     Active Member of [employer] or Organizations: No     Attends Banker Meetings: Not on file     Marital Status: Widowed  Intimate Partner Violence: Not At Risk (08/07/2023)    Humiliation, Afraid, Rape, and Kick questionnaire     Fear of Current or Ex-Partner: No     Emotionally Abused: No     Physically Abused: No     Sexually Abused: No      Review of Systems:    Constitutional: No weight loss, fever or chills Cardiovascular: No chest pain   Respiratory: No SOB  Gastrointestinal: See HPI and otherwise negative    Physical Exam:  Vital signs: BP 124/68 (BP Location: Left Arm, Patient Position: Sitting, Cuff Size: Normal)   Pulse 75   Ht 5' 6 (1.676 m)   Wt 159 lb 4 oz (72.2 kg)   SpO2 96%   BMI 25.70 kg/m     Constitutional:   Pleasant elderly female appears to be in NAD, Well developed, Well nourished, alert and cooperative Respiratory: Respirations even and unlabored. Lungs clear to auscultation bilaterally.   No wheezes, crackles, or rhonchi.  Cardiovascular: Normal S1, S2. No MRG. Regular rate and rhythm. No peripheral edema, cyanosis or pallor.  Gastrointestinal:  Soft, nondistended, nontender. No rebound or guarding. Normal bowel sounds. No appreciable masses or hepatomegaly. Rectal:  Not performed.  Psychiatric:  Demonstrates good judgement and reason without abnormal affect or behaviors.   RELEVANT LABS AND IMAGING: CBC Labs (Brief)          Component Value Date/Time    WBC 5.5 08/28/2023 1248    RBC 4.35 08/28/2023 1248     HGB 12.7 08/28/2023 1248    HCT 39.2 08/28/2023 1248    PLT 293.0 08/28/2023 1248    MCV 90.2 08/28/2023 1248    MCH 29.9 08/09/2023 0441    MCHC 32.5 08/28/2023 1248    RDW 14.0 08/28/2023 1248    LYMPHSABS 3.2 06/15/2016 1304    MONOABS 0.7 06/15/2016 1304    EOSABS 0.1 06/15/2016 1304    BASOSABS 0.0 06/15/2016 1304        CMP     Labs (Brief)          Component Value Date/Time    NA 137 08/28/2023  1248    K 4.1 08/28/2023 1248    CL 101 08/28/2023 1248    CO2 28 08/28/2023 1248    GLUCOSE 95 08/28/2023 1248    BUN 11 08/28/2023 1248    CREATININE 0.70 08/28/2023 1248    CALCIUM  10.0 08/28/2023 1248    PROT 7.6 08/28/2023 1248    ALBUMIN 4.5 08/28/2023 1248    AST 19 08/28/2023 1248    ALT 17 08/28/2023 1248    ALKPHOS 77 08/28/2023 1248    BILITOT 0.6 08/28/2023 1248    GFRNONAA >60 08/09/2023 0441    GFRAA >60 11/01/2019 1547        Assessment: 1.  Recurrent diverticulitis: Multiple hospitalizations requiring IV antibiotics for diverticulitis, the last in October, patient is now recovered with her last dose of antibiotics in November 4 or 5 per her, considering elective partial colectomy to avoid symptoms in the future, has appointment with surgical team in early December to discuss further and would like a colonoscopy   Plan: 1.  Scheduled patient for a diagnostic colonoscopy in the [facility] with Dr. Abran.  We will wait at least 6 weeks from patient finishing her antibiotics to schedule this.  Did provide the patient a detailed list of risks for the procedure and she agrees to proceed. Patient is appropriate for endoscopic procedure(s) in the ambulatory ([facility]) setting.  2.  Patient to follow in clinic per recommendations after time of procedure.   Delon Failing, PA-C [facility] 08/31/2023, 10:56 AM   Cc: Rollene Norris A, *   Recent H&P as above.  No interval change.  Anticipated segmental resection for recurrent diverticulitis.  Now for  colonoscopy

## 2023-10-23 NOTE — Patient Instructions (Signed)
-  Handout on diverticulosis provided -repeat colonoscopy  not recommended for surveillance recommended.  -Continue present medications  -Follow up with Dr. Teresa as planned  YOU HAD AN ENDOSCOPIC PROCEDURE TODAY AT THE [facility]:   Refer to the procedure report that was given to you for any specific questions about what was found during the examination.  If the procedure report does not answer your questions, please call your gastroenterologist to clarify.  If you requested that your care partner not be given the details of your procedure findings, then the procedure report has been included in a sealed envelope for you to review at your convenience later.  YOU SHOULD EXPECT: Some feelings of bloating in the abdomen. Passage of more gas than usual.  Walking can help get rid of the air that was put into your GI tract during the procedure and reduce the bloating. If you had a lower endoscopy (such as a colonoscopy or flexible sigmoidoscopy) you may notice spotting of blood in your stool or on the toilet paper. If you underwent a bowel prep for your procedure, you may not have a normal bowel movement for a few days.  Please Note:  You might notice some irritation and congestion in your nose or some drainage.  This is from the oxygen used during your procedure.  There is no need for concern and it should clear up in a day or so.  SYMPTOMS TO REPORT IMMEDIATELY:  Following lower endoscopy (colonoscopy or flexible sigmoidoscopy):  Excessive amounts of blood in the stool  Significant tenderness or worsening of abdominal pains  Swelling of the abdomen that is new, acute  Fever of 100F or higher  For urgent or emergent issues, a gastroenterologist can be reached at any hour by calling [phone]. Do not use MyChart messaging for urgent concerns.    DIET:  We do recommend a small meal at first, but then you may proceed to your regular diet.  Drink plenty of fluids but you should avoid  alcoholic beverages for 24 hours.  ACTIVITY:  You should plan to take it easy for the rest of today and you should NOT DRIVE or use heavy machinery until tomorrow (because of the sedation medicines used during the test).    FOLLOW UP: Our staff will call the number listed on your records the next business day following your procedure.  We will call around 7:15- 8:00 am to check on you and address any questions or concerns that you may have regarding the information given to you following your procedure. If we do not reach you, we will leave a message.     If any biopsies were taken you will be contacted by phone or by letter within the next 1-3 weeks.  Please call us  at [phone] if you have not heard about the biopsies in 3 weeks.    SIGNATURES/CONFIDENTIALITY: You and/or your care partner have signed paperwork which will be entered into your electronic medical record.  These signatures attest to the fact that that the information above on your After Visit Summary has been reviewed and is understood.  Full responsibility of the confidentiality of this discharge information lies with you and/or your care-partner.Sonya Brady

## 2023-10-24 ENCOUNTER — Telehealth: Payer: Self-pay

## 2023-10-24 NOTE — Telephone Encounter (Signed)
  Follow up Call-     10/23/2023    1:19 PM  Call back number  Post procedure Call Back phone  # [phone]  Permission to leave phone message Yes     Patient questions:  Do you have a fever, pain , or abdominal swelling? No. Pain Score  0 *  Have you tolerated food without any problems? Yes.    Have you been able to return to your normal activities? Yes.    Do you have any questions about your discharge instructions: Diet   No. Medications  No. Follow up visit  No.  Do you have questions or concerns about your Care? No.  Actions: * If pain score is 4 or above: No action needed, pain <4.

## 2023-10-29 ENCOUNTER — Ambulatory Visit: Payer: Self-pay | Admitting: Surgery

## 2023-10-29 DIAGNOSIS — Z01818 Encounter for other preprocedural examination: Secondary | ICD-10-CM

## 2023-10-29 NOTE — Progress Notes (Signed)
Surgery orders requested via Epic inbox with Dr. Cliffton Asters.

## 2023-11-02 ENCOUNTER — Encounter (HOSPITAL_COMMUNITY): Payer: Self-pay

## 2023-11-02 NOTE — Patient Instructions (Signed)
SURGICAL WAITING ROOM VISITATION  Patients having surgery or a procedure may have no more than 2 support people in the waiting area - these visitors may rotate.    Children under the age of 40 must have an adult with them who is not the patient.  Due to an increase in RSV and influenza rates and associated hospitalizations, children ages 54 and under may not visit patients in [facility] hospitals.  Visitors with respiratory illnesses are discouraged from visiting and should remain at home.  If the patient needs to stay at the hospital during part of their recovery, the visitor guidelines for inpatient rooms apply. Pre-op nurse will coordinate an appropriate time for 1 support person to accompany patient in pre-op.  This support person may not rotate.    Please refer to the [facility] website for the visitor guidelines for Inpatients (after your surgery is over and you are in a regular room).       Your procedure is scheduled on: 11-14-23   Report to [facility] Main Entrance    Report to admitting at      0645 AM   Call this number if you have problems the morning of surgery [phone]   CLEAR LIQUIDS DAY OF BOWEL PREP TO PREVENT DEHYDRATION              FOLLOW BOWEL PREP PER MD OFFICE     You may have the following liquids until _0600_____ AM/ DAY OF SURGERY   then nothing by mouth  Water Non-Citrus Juices (without pulp, NO RED-Apple, White grape, White cranberry) Black Coffee (NO MILK/CREAM OR CREAMERS, sugar ok)  Clear Tea (NO MILK/CREAM OR CREAMERS, sugar ok) regular and decaf                             Plain Jell-O (NO RED)                                           Fruit ices (not with fruit pulp, NO RED)                                     Popsicles (NO RED)                                                               Sports drinks like Gatorade (NO RED)              Drink 2/G2 drinks AT 10:00 PM the night before surgery.        The day of surgery:  Drink  ONE (1) Pre-Surgery  G2 BY 0600 AM the morning of surgery. Drink in one sitting. Do not sip.  This drink was given to you during your hospital  pre-op appointment visit. Nothing else to drink after completing the  Pre-Surgery G2.          If you have questions, please contact your surgeon's office.   FOLLOW BOWEL PREP AND ANY ADDITIONAL PRE OP INSTRUCTIONS YOU RECEIVED FROM YOUR SURGEON'S OFFICE!!!  Oral Hygiene is also important to reduce your risk of infection.                                    Remember - BRUSH YOUR TEETH THE MORNING OF SURGERY WITH YOUR REGULAR TOOTHPASTE  DENTURES WILL BE REMOVED PRIOR TO SURGERY PLEASE DO NOT APPLY "Poly grip" OR ADHESIVES!!!   Do NOT smoke after Midnight   Stop all vitamins and herbal supplements 7 days before surgery.   Take these medicines the morning of surgery with A SIP OF WATER: omeprazole, levothyroxine, eye drops as usual  DO NOT TAKE ANY ORAL DIABETIC MEDICATIONS DAY OF YOUR SURGERY                                You may not have any metal on your body including hair pins, jewelry, and body piercing             Do not wear make-up, lotions, powders, perfumes/cologne, or deodorant  Do not wear nail polish including gel and S&S, artificial/acrylic nails, or any other type of covering on natural nails including finger and toenails. If you have artificial nails, gel coating, etc. that needs to be removed by a nail salon please have this removed prior to surgery or surgery may need to be canceled/ delayed if the surgeon/ anesthesia feels like they are unable to be safely monitored.   Do not shave  48 hours prior to surgery.            Do not bring valuables to the hospital. [facility] IS NOT             RESPONSIBLE   FOR VALUABLES.   Contacts, glasses, dentures or bridgework may not be worn into surgery.   Bring small overnight bag day of surgery.   DO NOT BRING YOUR HOME MEDICATIONS TO THE HOSPITAL. PHARMACY WILL DISPENSE  MEDICATIONS LISTED ON YOUR MEDICATION LIST TO YOU DURING YOUR ADMISSION IN THE HOSPITAL!    Patients discharged on the day of surgery will not be allowed to drive home.  Someone NEEDS to stay with you for the first 24 hours after anesthesia.   Special Instructions: Bring a copy of your healthcare power of attorney and living will documents the day of surgery if you haven't scanned them before.              Please read over the following fact sheets you were given: IF YOU HAVE QUESTIONS ABOUT YOUR PRE-OP INSTRUCTIONS PLEASE CALL [phone]    If you test positive for Covid or have been in contact with anyone that has tested positive in the last 10 days please notify you surgeon.    [place] - Preparing for Surgery Before surgery, you can play an important role.  Because skin is not sterile, your skin needs to be as free of germs as possible.  You can reduce the number of germs on your skin by washing with CHG (chlorahexidine gluconate) soap before surgery.  CHG is an antiseptic cleaner which kills germs and bonds with the skin to continue killing germs even after washing. Please DO NOT use if you have an allergy to CHG or antibacterial soaps.  If your skin becomes reddened/irritated stop using the CHG and inform your nurse when you arrive at Short Stay. Do not shave (including legs  and underarms) for at least 48 hours prior to the first CHG shower.  You may shave your face/neck. Please follow these instructions carefully:  1.  Shower with CHG Soap the night before surgery and the  morning of Surgery.  2.  If you choose to wash your hair, wash your hair first as usual with your  normal  shampoo.  3.  After you shampoo, rinse your hair and body thoroughly to remove the  shampoo.                           4.  Use CHG as you would any other liquid soap.  You can apply chg directly  to the skin and wash                       Gently with a scrungie or clean washcloth.  5.  Apply the CHG Soap to  your body ONLY FROM THE NECK DOWN.   Do not use on face/ open                           Wound or open sores. Avoid contact with eyes, ears mouth and genitals (private parts).                       Wash face,  Genitals (private parts) with your normal soap.             6.  Wash thoroughly, paying special attention to the area where your surgery  will be performed.  7.  Thoroughly rinse your body with warm water from the neck down.  8.  DO NOT shower/wash with your normal soap after using and rinsing off  the CHG Soap.                9.  Pat yourself dry with a clean towel.            10.  Wear clean pajamas.            11.  Place clean sheets on your bed the night of your first shower and do not  sleep with pets. Day of Surgery : Do not apply any lotions/deodorants the morning of surgery.  Please wear clean clothes to the hospital/surgery center.  FAILURE TO FOLLOW THESE INSTRUCTIONS MAY RESULT IN THE CANCELLATION OF YOUR SURGERY PATIENT SIGNATURE_________________________________  NURSE SIGNATURE__________________________________  ________________________________________________________________________ WHAT IS A BLOOD TRANSFUSION? Blood Transfusion Information  A transfusion is the replacement of blood or some of its parts. Blood is made up of multiple cells which provide different functions. Red blood cells carry oxygen and are used for blood loss replacement. White blood cells fight against infection. Platelets control bleeding. Plasma helps clot blood. Other blood products are available for specialized needs, such as hemophilia or other clotting disorders. BEFORE THE TRANSFUSION  Who gives blood for transfusions?  Healthy volunteers who are fully evaluated to make sure their blood is safe. This is blood bank blood. Transfusion therapy is the safest it has ever been in the practice of medicine. Before blood is taken from a donor, a complete history is taken to make sure that person has  no history of diseases nor engages in risky social behavior (examples are intravenous drug use or sexual activity with multiple partners). The donor's travel history is screened to minimize risk of transmitting infections, such as malaria.  The donated blood is tested for signs of infectious diseases, such as HIV and hepatitis. The blood is then tested to be sure it is compatible with you in order to minimize the chance of a transfusion reaction. If you or a relative donates blood, this is often done in anticipation of surgery and is not appropriate for emergency situations. It takes many days to process the donated blood. RISKS AND COMPLICATIONS Although transfusion therapy is very safe and saves many lives, the main dangers of transfusion include:  Getting an infectious disease. Developing a transfusion reaction. This is an allergic reaction to something in the blood you were given. Every precaution is taken to prevent this. The decision to have a blood transfusion has been considered carefully by your caregiver before blood is given. Blood is not given unless the benefits outweigh the risks. AFTER THE TRANSFUSION Right after receiving a blood transfusion, you will usually feel much better and more energetic. This is especially true if your red blood cells have gotten low (anemic). The transfusion raises the level of the red blood cells which carry oxygen, and this usually causes an energy increase. The nurse administering the transfusion will monitor you carefully for complications. HOME CARE INSTRUCTIONS  No special instructions are needed after a transfusion. You may find your energy is better. Speak with your caregiver about any limitations on activity for underlying diseases you may have. SEEK MEDICAL CARE IF:  Your condition is not improving after your transfusion. You develop redness or irritation at the intravenous (IV) site. SEEK IMMEDIATE MEDICAL CARE IF:  Any of the following symptoms  occur over the next 12 hours: Shaking chills. You have a temperature by mouth above 102 F (38.9 C), not controlled by medicine. Chest, back, or muscle pain. People around you feel you are not acting correctly or are confused. Shortness of breath or difficulty breathing. Dizziness and fainting. You get a rash or develop hives. You have a decrease in urine output. Your urine turns a dark color or changes to pink, red, or brown. Any of the following symptoms occur over the next 10 days: You have a temperature by mouth above 102 F (38.9 C), not controlled by medicine. Shortness of breath. Weakness after normal activity. The white part of the eye turns yellow (jaundice). You have a decrease in the amount of urine or are urinating less often. Your urine turns a dark color or changes to pink, red, or brown. Document Released: 09/22/2000 Document Revised: 12/18/2011 Document Reviewed: 05/11/2008 Exit[employer] Patient Information 2014 [place], [place].  _______________________________________________________________________  Incentive Spirometer  An incentive spirometer is a tool that can help keep your lungs clear and active. This tool measures how well you are filling your lungs with each breath. Taking long deep breaths may help reverse or decrease the chance of developing breathing (pulmonary) problems (especially infection) following: A long period of time when you are unable to move or be active. BEFORE THE PROCEDURE  If the spirometer includes an indicator to show your best effort, your nurse or respiratory therapist will set it to a desired goal. If possible, sit up straight or lean slightly forward. Try not to slouch. Hold the incentive spirometer in an upright position. INSTRUCTIONS FOR USE  Sit on the edge of your bed if possible, or sit up as far as you can in bed or on a chair. Hold the incentive spirometer in an upright position. Breathe out normally. Place the mouthpiece in your  mouth and seal your lips  tightly around it. Breathe in slowly and as deeply as possible, raising the piston or the ball toward the top of the column. Hold your breath for 3-5 seconds or for as long as possible. Allow the piston or ball to fall to the bottom of the column. Remove the mouthpiece from your mouth and breathe out normally. Rest for a few seconds and repeat Steps 1 through 7 at least 10 times every 1-2 hours when you are awake. Take your time and take a few normal breaths between deep breaths. The spirometer may include an indicator to show your best effort. Use the indicator as a goal to work toward during each repetition. After each set of 10 deep breaths, practice coughing to be sure your lungs are clear. If you have an incision (the cut made at the time of surgery), support your incision when coughing by placing a pillow or rolled up towels firmly against it. Once you are able to get out of bed, walk around indoors and cough well. You may stop using the incentive spirometer when instructed by your caregiver.  RISKS AND COMPLICATIONS Take your time so you do not get dizzy or light-headed. If you are in pain, you may need to take or ask for pain medication before doing incentive spirometry. It is harder to take a deep breath if you are having pain. AFTER USE Rest and breathe slowly and easily. It can be helpful to keep track of a log of your progress. Your caregiver can provide you with a simple table to help with this. If you are using the spirometer at home, follow these instructions: SEEK MEDICAL CARE IF:  You are having difficultly using the spirometer. You have trouble using the spirometer as often as instructed. Your pain medication is not giving enough relief while using the spirometer. You develop fever of 100.5 F (38.1 C) or higher. SEEK IMMEDIATE MEDICAL CARE IF:  You cough up bloody sputum that had not been present before. You develop fever of 102 F (38.9 C) or  greater. You develop worsening pain at or near the incision site. MAKE SURE YOU:  Understand these instructions. Will watch your condition. Will get help right away if you are not doing well or get worse. Document Released: 02/05/2007 Document Revised: 12/18/2011 Document Reviewed: 04/08/2007 [facility] Patient Information 2014 [place], [place].   ________________________________________________________________________

## 2023-11-02 NOTE — Progress Notes (Signed)
  PCP - Hillard Danker LOV 08-17-23 epic Cardiologist - no  PPM/ICD -  Device Orders -  Rep Notified -   Chest x-ray - 04-11-23 epic EKG - 11-05-23  epic Stress Test -  ECHO -  Cardiac Cath -   Sleep Study -  CPAP -   Fasting Blood Sugar - 102 Checks Blood Sugar __every other day___ times a day  Blood Thinner Instructions: Aspirin Instructions:  ERAS Protcol -3 drinks PRE-SURGERY G2-    COVID vaccine -yes  Activity--Able to climb a flight of stairs without CP or SOB has stairs in home Anesthesia review: DM2, Heart Murmur no echo,HTN  Patient denies shortness of breath, fever, cough and chest pain at PAT appointment   All instructions explained to the patient, with a verbal understanding of the material. Patient agrees to go over the instructions while at home for a better understanding. Patient also instructed to self quarantine after being tested for COVID-19. The opportunity to ask questions was provided.

## 2023-11-05 ENCOUNTER — Encounter (HOSPITAL_COMMUNITY)
Admission: RE | Admit: 2023-11-05 | Discharge: 2023-11-05 | Disposition: A | Discharge status: Home / Self Care | Payer: Medicare HMO | Source: Ambulatory Visit | Attending: Surgery | Admitting: Surgery

## 2023-11-05 ENCOUNTER — Other Ambulatory Visit: Payer: Self-pay

## 2023-11-05 ENCOUNTER — Encounter (HOSPITAL_COMMUNITY): Payer: Self-pay

## 2023-11-05 VITALS — BP 156/85 | HR 71 | Temp 98.7°F | Resp 16 | Ht 65.0 in | Wt 158.0 lb

## 2023-11-05 DIAGNOSIS — E118 Type 2 diabetes mellitus with unspecified complications: Secondary | ICD-10-CM | POA: Diagnosis not present

## 2023-11-05 DIAGNOSIS — Z01818 Encounter for other preprocedural examination: Secondary | ICD-10-CM | POA: Insufficient documentation

## 2023-11-05 HISTORY — DX: Anemia, unspecified: D64.9

## 2023-11-05 HISTORY — DX: Pneumonia, unspecified organism: J18.9

## 2023-11-05 LAB — CBC WITH DIFFERENTIAL/PLATELET
Abs Immature Granulocytes: 0.01 10*3/uL (ref 0.00–0.07)
Basophils Absolute: 0.1 10*3/uL (ref 0.0–0.1)
Basophils Relative: 1 %
Eosinophils Absolute: 0.1 10*3/uL (ref 0.0–0.5)
Eosinophils Relative: 2 %
HCT: 40.2 % (ref 36.0–46.0)
Hemoglobin: 12.9 g/dL (ref 12.0–15.0)
Immature Granulocytes: 0 %
Lymphocytes Relative: 50 %
Lymphs Abs: 2.8 10*3/uL (ref 0.7–4.0)
MCH: 29.5 pg (ref 26.0–34.0)
MCHC: 32.1 g/dL (ref 30.0–36.0)
MCV: 92 fL (ref 80.0–100.0)
Monocytes Absolute: 0.6 10*3/uL (ref 0.1–1.0)
Monocytes Relative: 11 %
Neutro Abs: 2 10*3/uL (ref 1.7–7.7)
Neutrophils Relative %: 36 %
Platelets: 258 10*3/uL (ref 150–400)
RBC: 4.37 MIL/uL (ref 3.87–5.11)
RDW: 13 % (ref 11.5–15.5)
WBC: 5.6 10*3/uL (ref 4.0–10.5)
nRBC: 0 % (ref 0.0–0.2)

## 2023-11-05 LAB — COMPREHENSIVE METABOLIC PANEL
ALT: 14 U/L (ref 0–44)
AST: 15 U/L (ref 15–41)
Albumin: 4.1 g/dL (ref 3.5–5.0)
Alkaline Phosphatase: 70 U/L (ref 38–126)
Anion gap: 10 (ref 5–15)
BUN: 12 mg/dL (ref 8–23)
CO2: 23 mmol/L (ref 22–32)
Calcium: 9.6 mg/dL (ref 8.9–10.3)
Chloride: 106 mmol/L (ref 98–111)
Creatinine, Ser: 0.64 mg/dL (ref 0.44–1.00)
GFR, Estimated: >60 mL/min (ref >60)
Glucose, Bld: 93 mg/dL (ref 70–99)
Potassium: 3.8 mmol/L (ref 3.5–5.1)
Sodium: 139 mmol/L (ref 135–145)
Total Bilirubin: 0.8 mg/dL (ref 0.0–1.2)
Total Protein: 7.2 g/dL (ref 6.5–8.1)

## 2023-11-05 LAB — HEMOGLOBIN A1C
Hgb A1c MFr Bld: 5.7 % — ABNORMAL HIGH (ref 4.8–5.6)
Mean Plasma Glucose: 116.89 mg/dL

## 2023-11-05 LAB — GLUCOSE, CAPILLARY: Glucose-Capillary: 80 mg/dL (ref 70–99)

## 2023-11-07 ENCOUNTER — Telehealth: Payer: Self-pay | Admitting: Internal Medicine

## 2023-11-07 NOTE — Telephone Encounter (Signed)
Spoke with [facility] and they stated that will fax over forms for surgical clarence

## 2023-11-07 NOTE — Telephone Encounter (Signed)
Placed inside providers box

## 2023-11-07 NOTE — Telephone Encounter (Signed)
Copied from CRM [id number] [id number]. Topic: General - Other >> Nov 07, 2023 12:07 PM [name] wrote: Reason for CRM: Sonya Brady with [facility] stated that she needs medical clearance back as soon as possible. Patient has surgery on 02/05.

## 2023-11-07 NOTE — Telephone Encounter (Signed)
Surgical clearance received from [facility] - placed in provider's box up front.

## 2023-11-08 NOTE — Telephone Encounter (Signed)
This form is not actually a surgical clearance form do we have this?

## 2023-11-08 NOTE — Telephone Encounter (Signed)
No we don't they said they don't have do actually forms for surgical clarence but they faxed [place] a sheet which you have already signed.

## 2023-11-13 ENCOUNTER — Encounter (HOSPITAL_COMMUNITY): Payer: Self-pay | Admitting: Surgery

## 2023-11-13 NOTE — Anesthesia Preprocedure Evaluation (Addendum)
 Anesthesia Evaluation  Patient identified by MRN, date of birth, ID band Patient awake    Reviewed: Allergy & Precautions, NPO status , Patient's Chart, lab work & pertinent test results  History of Anesthesia Complications (+) PONV and history of anesthetic complications  Airway Mallampati: II  TM Distance: >3 FB Neck ROM: Full    Dental no notable dental hx.    Pulmonary neg pulmonary ROS   Pulmonary exam normal        Cardiovascular hypertension, Pt. on medications  Rhythm:Regular Rate:Normal     Neuro/Psych    Depression    negative neurological ROS     GI/Hepatic ,GERD  Medicated,,  Endo/Other  diabetes, Type 2, Oral Hypoglycemic AgentsHypothyroidism    Renal/GU   negative genitourinary   Musculoskeletal  (+) Arthritis , Osteoarthritis,    Abdominal   Peds  Hematology negative hematology ROS (+)   Anesthesia Other Findings   Reproductive/Obstetrics                             Anesthesia Physical Anesthesia Plan  ASA: 2  Anesthesia Plan: General   Post-op Pain Management: Tylenol  PO (pre-op)* and Dilaudid  IV   Induction: Intravenous  PONV Risk Score and Plan: 4 or greater and Ondansetron , Dexamethasone , Treatment may vary due to age or medical condition, Midazolam  and Droperidol  Airway Management Planned: Oral ETT  Additional Equipment: None  Intra-op Plan:   Post-operative Plan: Extubation in OR  Informed Consent: I have reviewed the patients History and Physical, chart, labs and discussed the procedure including the risks, benefits and alternatives for the proposed anesthesia with the patient or authorized representative who has indicated his/her understanding and acceptance.     Dental advisory given  Plan Discussed with:   Anesthesia Plan Comments: (See PAT note from 1/27 by MARLA Senna PA-C )        Anesthesia Quick Evaluation

## 2023-11-13 NOTE — Progress Notes (Signed)
Case: [id number] Date/Time: 11/14/23 0845   Procedures:      XI ROBOTIC ASSISTED LOWER ANTERIOR RESECTION WITH COLORECTAL ANASTOMOSIS     FLEXIBLE SIGMOIDOSCOPY, ICG PERFUSION ASSESSMENT   Anesthesia type: General   Pre-op diagnosis: recurrent diverticulitis   [facility] ROOM 02 / [facility]   Surgeons: Andria Meuse, MD       DISCUSSION: Sonya Brady is a 73 year old female who presents to PAT prior to surgery above.  Past medical history significant for hypertension, heart murmur, GERD, type 2 diabetes, goiter, hypothyroidism, depression  Prior anesthesia complications include PONV, and possible IntraOp awareness during right breast biopsy  Patient follows with her PCP for management of chronic issues.  Last seen on 08/17/2023.  Referred to GI and surgery due to recurrent diverticulitis but otherwise all medical issues appear to be stable.  Medical clearance signed by PCP that she is okay for surgery and low risk dated 11/08/2023 (scanned in media tab on 11/09/2023)  VS: BP (!) 156/85   Pulse 71   Temp 37.1 C (Oral)   Resp 16   Ht 5\' 5"  (1.651 m)   Wt 71.7 kg   SpO2 100%   BMI 26.29 kg/m   PROVIDERS: Myrlene Broker, MD   LABS: Labs reviewed: Acceptable for surgery. (all labs ordered are listed, but only abnormal results are displayed)  Labs Reviewed  HEMOGLOBIN A1C - Abnormal; Notable for the following components:      Result Value   Hgb A1c MFr Bld 5.7 (*)    All other components within normal limits  CBC WITH DIFFERENTIAL/PLATELET  COMPREHENSIVE METABOLIC PANEL  GLUCOSE, CAPILLARY  TYPE AND SCREEN     IMAGES:  CT abdomen pelvis 08/06/2023:  IMPRESSION: 1. Worsening acute uncomplicated diverticulitis of the sigmoid colon. No perforation, fluid collection, or abscess. Follow-up recommended to document resolution and exclude underlying colonic mass. 2. Cholelithiasis without cholecystitis. 3.  Aortic Atherosclerosis (ICD10-I70.0).    EKG  11/05/2023:  Normal sinus rhythm, rate 75  CV:  Past Medical History:  Diagnosis Date   Acne    Allergy    Anemia    hx of   Arthritis    Carpal tunnel syndrome of left wrist 10/21/2021   Cataract    Complication of anesthesia    could hear sounds with r breast biopsy yrs ago,   Depression    situational when husband was sick and dying.   Diabetes mellitus Type 2    Diverticulosis    GERD (gastroesophageal reflux disease)    Glaucoma    Goiter    benign   right 5.7 cm/3.0x1.8cm left 5.2 x 2.4 x 2.1 cm per 05-09-2019 [place] epic no further follow up needed   H. pylori infection    Heart murmur    History of COVID-19 2020   sick x 1 week all symptoms resolved   Hypercholesteremia    Hypertension    Hypothyroidism    Insomnia    Pneumonia    PONV (postoperative nausea and vomiting)    x 1 yrs ago per pt on 10-24-2021   Tinnitus, left ear 10/24/2021   for last 30 yrs   Unspecified Eustachian tube disorder, left ear     Past Surgical History:  Procedure Laterality Date   ABDOMINAL HYSTERECTOMY     both ovary intact age 28   BREAST LUMPECTOMY Bilateral    benign mmore than 35 yrs ago   CARPAL TUNNEL RELEASE Left 10/27/2021   Procedure: CARPAL TUNNEL RELEASE;  Surgeon: Gomez Cleverly, MD;  Location: [facility];  Service: Orthopedics;  Laterality: Left;   carpel tunnel surgery Right    right 30 yrs ago per pt on 10-24-2021   CARPOMETACARPEL SUSPENSION PLASTY Left 10/27/2021   Procedure: left thumb carpometacarpal joint arthroplasty with tendon transfer;  Surgeon: Gomez Cleverly, MD;  Location[facility];  Service: Orthopedics;  Laterality: Left;  with MAC   EXCISION MASS UPPER EXTREMETIES Right 06/05/2022   Procedure: EXCISION MASS RIGHT SHOULDER;  Surgeon: Luretha Murphy, MD;  Location: [facility];  Service: General;  Laterality: Right;   EYE SURGERY     cataracts yrs ago per pt on 10-24-2021   left ear eardrum reconstruction      ponv after 40 yrs ago   right lower abdominal mass benign removed  1978   hernia right  lower abd   TYMPANOPLASTY     tube left ear multiple times ober last 44 years   UPPER GI ENDOSCOPY     2014 and 2018    MEDICATIONS:  atorvastatin (LIPITOR) 80 MG tablet   Biotin 5000 MCG CAPS   Blood Glucose Monitoring Suppl (ONETOUCH VERIO) w/Device KIT   clindamycin (CLEOCIN T) 1 % external solution   diphenhydrAMINE HCl, Sleep, (ZZZQUIL) 25 MG CAPS   dorzolamide-timolol (COSOPT) 2-0.5 % ophthalmic solution   glucose blood (ONETOUCH VERIO) test strip   latanoprost (XALATAN) 0.005 % ophthalmic solution   levothyroxine (SYNTHROID) 112 MCG tablet   lisinopril-hydrochlorothiazide (ZESTORETIC) 20-25 MG tablet   Melatonin 10 MG TABS   metFORMIN (GLUCOPHAGE-XR) 500 MG 24 hr tablet   montelukast (SINGULAIR) 10 MG tablet   Multiple Vitamins-Minerals (PRESERVISION AREDS 2) CAPS   omeprazole (PRILOSEC) 20 MG capsule   OneTouch Delica Lancets 30G MISC   polyethylene glycol (MIRALAX / GLYCOLAX) 17 g packet   No current facility-administered medications for this encounter.   Marcille Blanco [facility] Short Stay/Anesthesiology [facility] Phone [phone] 11/13/2023 9:19 AM

## 2023-11-14 ENCOUNTER — Inpatient Hospital Stay (HOSPITAL_COMMUNITY): Payer: Self-pay | Admitting: Anesthesiology

## 2023-11-14 ENCOUNTER — Encounter (HOSPITAL_COMMUNITY)
Admission: RE | Disposition: A | Discharge status: Home / Self Care | Payer: Self-pay | Source: Home / Self Care | Attending: Surgery

## 2023-11-14 ENCOUNTER — Encounter (HOSPITAL_COMMUNITY): Payer: Self-pay | Admitting: Surgery

## 2023-11-14 ENCOUNTER — Inpatient Hospital Stay (HOSPITAL_COMMUNITY)
Admission: RE | Admit: 2023-11-14 | Discharge: 2023-11-17 | DRG: 330 | Disposition: A | Discharge status: Home / Self Care | Payer: Medicare HMO | Attending: Surgery | Admitting: Surgery

## 2023-11-14 ENCOUNTER — Inpatient Hospital Stay (HOSPITAL_COMMUNITY): Payer: Medicare HMO | Admitting: Physician Assistant

## 2023-11-14 ENCOUNTER — Other Ambulatory Visit: Payer: Self-pay

## 2023-11-14 ENCOUNTER — Other Ambulatory Visit (HOSPITAL_COMMUNITY): Payer: Self-pay

## 2023-11-14 DIAGNOSIS — I2510 Atherosclerotic heart disease of native coronary artery without angina pectoris: Secondary | ICD-10-CM | POA: Diagnosis present

## 2023-11-14 DIAGNOSIS — E039 Hypothyroidism, unspecified: Secondary | ICD-10-CM | POA: Diagnosis present

## 2023-11-14 DIAGNOSIS — E119 Type 2 diabetes mellitus without complications: Secondary | ICD-10-CM

## 2023-11-14 DIAGNOSIS — Z823 Family history of stroke: Secondary | ICD-10-CM

## 2023-11-14 DIAGNOSIS — Z881 Allergy status to other antibiotic agents status: Secondary | ICD-10-CM | POA: Diagnosis not present

## 2023-11-14 DIAGNOSIS — Z8249 Family history of ischemic heart disease and other diseases of the circulatory system: Secondary | ICD-10-CM

## 2023-11-14 DIAGNOSIS — I10 Essential (primary) hypertension: Secondary | ICD-10-CM | POA: Diagnosis present

## 2023-11-14 DIAGNOSIS — E7800 Pure hypercholesterolemia, unspecified: Secondary | ICD-10-CM | POA: Diagnosis present

## 2023-11-14 DIAGNOSIS — Z8701 Personal history of pneumonia (recurrent): Secondary | ICD-10-CM

## 2023-11-14 DIAGNOSIS — K654 Sclerosing mesenteritis: Secondary | ICD-10-CM | POA: Diagnosis present

## 2023-11-14 DIAGNOSIS — E876 Hypokalemia: Secondary | ICD-10-CM | POA: Diagnosis present

## 2023-11-14 DIAGNOSIS — Z90710 Acquired absence of both cervix and uterus: Secondary | ICD-10-CM | POA: Diagnosis not present

## 2023-11-14 DIAGNOSIS — K219 Gastro-esophageal reflux disease without esophagitis: Secondary | ICD-10-CM | POA: Diagnosis present

## 2023-11-14 DIAGNOSIS — Z8349 Family history of other endocrine, nutritional and metabolic diseases: Secondary | ICD-10-CM

## 2023-11-14 DIAGNOSIS — Z860100 Personal history of colon polyps, unspecified: Secondary | ICD-10-CM

## 2023-11-14 DIAGNOSIS — K5730 Diverticulosis of large intestine without perforation or abscess without bleeding: Secondary | ICD-10-CM | POA: Diagnosis not present

## 2023-11-14 DIAGNOSIS — E118 Type 2 diabetes mellitus with unspecified complications: Secondary | ICD-10-CM

## 2023-11-14 DIAGNOSIS — F32A Depression, unspecified: Secondary | ICD-10-CM | POA: Diagnosis present

## 2023-11-14 DIAGNOSIS — Z9049 Acquired absence of other specified parts of digestive tract: Principal | ICD-10-CM

## 2023-11-14 DIAGNOSIS — K5792 Diverticulitis of intestine, part unspecified, without perforation or abscess without bleeding: Secondary | ICD-10-CM

## 2023-11-14 DIAGNOSIS — Z79899 Other long term (current) drug therapy: Secondary | ICD-10-CM

## 2023-11-14 DIAGNOSIS — Z7984 Long term (current) use of oral hypoglycemic drugs: Secondary | ICD-10-CM | POA: Diagnosis not present

## 2023-11-14 DIAGNOSIS — K5732 Diverticulitis of large intestine without perforation or abscess without bleeding: Secondary | ICD-10-CM | POA: Diagnosis not present

## 2023-11-14 DIAGNOSIS — K5720 Diverticulitis of large intestine with perforation and abscess without bleeding: Secondary | ICD-10-CM | POA: Diagnosis not present

## 2023-11-14 DIAGNOSIS — Z8616 Personal history of COVID-19: Secondary | ICD-10-CM | POA: Diagnosis not present

## 2023-11-14 HISTORY — PX: FLEXIBLE SIGMOIDOSCOPY: SHX5431

## 2023-11-14 HISTORY — PX: XI ROBOTIC ASSISTED LOWER ANTERIOR RESECTION: SHX6558

## 2023-11-14 LAB — TYPE AND SCREEN
ABO/RH(D): A POS
Antibody Screen: NEGATIVE

## 2023-11-14 LAB — GLUCOSE, CAPILLARY
Glucose-Capillary: 125 mg/dL — ABNORMAL HIGH (ref 70–99)
Glucose-Capillary: 120 mg/dL — ABNORMAL HIGH (ref 70–99)
Glucose-Capillary: 178 mg/dL — ABNORMAL HIGH (ref 70–99)
Glucose-Capillary: 160 mg/dL — ABNORMAL HIGH (ref 70–99)
Glucose-Capillary: 143 mg/dL — ABNORMAL HIGH (ref 70–99)

## 2023-11-14 LAB — ABO/RH: ABO/RH(D): A POS

## 2023-11-14 SURGERY — RESECTION, RECTUM, LOW ANTERIOR, ROBOT-ASSISTED
Anesthesia: General | Site: Rectum

## 2023-11-14 MED ORDER — ROCURONIUM BROMIDE 100 MG/10ML IV SOLN
INTRAVENOUS | Status: DC | PRN
  Administered 2023-11-14: 60 mg via INTRAVENOUS
  Administered 2023-11-14: 40 mg via INTRAVENOUS

## 2023-11-14 MED ORDER — NEOMYCIN SULFATE 500 MG PO TABS
1000.0000 mg | ORAL_TABLET | ORAL | Status: DC
  Filled 2023-11-14: qty 2

## 2023-11-14 MED ORDER — INSULIN ASPART 100 UNIT/ML IJ SOLN
0.0000 [IU] | Freq: Three times a day (TID) | INTRAMUSCULAR | Status: DC
  Administered 2023-11-14: 3 [IU] via SUBCUTANEOUS
  Administered 2023-11-15: 2 [IU] via SUBCUTANEOUS
  Administered 2023-11-16: 3 [IU] via SUBCUTANEOUS
  Administered 2023-11-17: 2 [IU] via SUBCUTANEOUS

## 2023-11-14 MED ORDER — FENTANYL CITRATE (PF) 100 MCG/2ML IJ SOLN
INTRAMUSCULAR | Status: AC
  Filled 2023-11-14: qty 2

## 2023-11-14 MED ORDER — ALUM & MAG HYDROXIDE-SIMETH 200-200-20 MG/5ML PO SUSP: 30.0000 mL | Freq: Four times a day (QID) | ORAL | Status: DC | PRN

## 2023-11-14 MED ORDER — ROCURONIUM BROMIDE 10 MG/ML (PF) SYRINGE
PREFILLED_SYRINGE | INTRAVENOUS | Status: AC
  Filled 2023-11-14: qty 10

## 2023-11-14 MED ORDER — ORAL CARE MOUTH RINSE
15.0000 mL | Freq: Once | OROMUCOSAL | Status: AC
Start: 2023-11-14 — End: 2023-11-14

## 2023-11-14 MED ORDER — ESMOLOL HCL 100 MG/10ML IV SOLN
INTRAVENOUS | Status: DC | PRN
  Administered 2023-11-14: 20 mg via INTRAVENOUS

## 2023-11-14 MED ORDER — ACETAMINOPHEN 500 MG PO TABS
1000.0000 mg | ORAL_TABLET | ORAL | Status: AC
  Administered 2023-11-14: 1000 mg via ORAL
  Filled 2023-11-14: qty 2

## 2023-11-14 MED ORDER — HYDROMORPHONE HCL 2 MG/ML IJ SOLN
INTRAMUSCULAR | Status: AC
  Filled 2023-11-14: qty 1

## 2023-11-14 MED ORDER — PROCHLORPERAZINE EDISYLATE 10 MG/2ML IJ SOLN
10.0000 mg | Freq: Four times a day (QID) | INTRAMUSCULAR | Status: DC | PRN
  Administered 2023-11-14: 10 mg via INTRAVENOUS
  Filled 2023-11-14: qty 2

## 2023-11-14 MED ORDER — IBUPROFEN 400 MG PO TABS
600.0000 mg | ORAL_TABLET | Freq: Four times a day (QID) | ORAL | Status: DC | PRN
  Administered 2023-11-14 – 2023-11-16 (×3): 600 mg via ORAL
  Filled 2023-11-14 (×3): qty 1

## 2023-11-14 MED ORDER — TRAMADOL HCL 50 MG PO TABS
50.0000 mg | ORAL_TABLET | Freq: Four times a day (QID) | ORAL | Status: DC | PRN
  Administered 2023-11-15 – 2023-11-16 (×4): 50 mg via ORAL
  Filled 2023-11-14 (×4): qty 1

## 2023-11-14 MED ORDER — BUPIVACAINE-EPINEPHRINE (PF) 0.25% -1:200000 IJ SOLN
INTRAMUSCULAR | Status: DC | PRN
  Administered 2023-11-14: 30 mL via PERINEURAL

## 2023-11-14 MED ORDER — BUPIVACAINE LIPOSOME 1.3 % IJ SUSP
INTRAMUSCULAR | Status: DC | PRN
  Administered 2023-11-14: 20 mL

## 2023-11-14 MED ORDER — DIPHENHYDRAMINE HCL 50 MG/ML IJ SOLN: 12.5000 mg | Freq: Four times a day (QID) | INTRAMUSCULAR | Status: DC | PRN

## 2023-11-14 MED ORDER — ONDANSETRON HCL 4 MG/2ML IJ SOLN
4.0000 mg | Freq: Four times a day (QID) | INTRAMUSCULAR | Status: DC | PRN
  Administered 2023-11-14: 4 mg via INTRAVENOUS
  Filled 2023-11-14: qty 2

## 2023-11-14 MED ORDER — PROMETHAZINE HCL 25 MG/ML IJ SOLN: 12.5000 mg | Freq: Once | INTRAMUSCULAR | Status: DC | PRN

## 2023-11-14 MED ORDER — LISINOPRIL 20 MG PO TABS
20.0000 mg | ORAL_TABLET | Freq: Every day | ORAL | Status: DC
  Administered 2023-11-15 – 2023-11-17 (×3): 20 mg via ORAL
  Filled 2023-11-14 (×3): qty 1

## 2023-11-14 MED ORDER — BUPIVACAINE LIPOSOME 1.3 % IJ SUSP
INTRAMUSCULAR | Status: AC
  Filled 2023-11-14: qty 20

## 2023-11-14 MED ORDER — PROPOFOL 10 MG/ML IV BOLUS
INTRAVENOUS | Status: DC | PRN
  Administered 2023-11-14: 60 mg via INTRAVENOUS
  Administered 2023-11-14: 140 mg via INTRAVENOUS

## 2023-11-14 MED ORDER — BUPIVACAINE LIPOSOME 1.3 % IJ SUSP: 20.0000 mL | Freq: Once | INTRAMUSCULAR | Status: DC

## 2023-11-14 MED ORDER — POLYETHYLENE GLYCOL 3350 17 GM/SCOOP PO POWD
1.0000 | Freq: Once | ORAL | Status: DC
  Filled 2023-11-14: qty 255

## 2023-11-14 MED ORDER — HEPARIN SODIUM (PORCINE) 5000 UNIT/ML IJ SOLN
5000.0000 [IU] | Freq: Once | INTRAMUSCULAR | Status: AC
  Administered 2023-11-14: 5000 [IU] via SUBCUTANEOUS
  Filled 2023-11-14: qty 1

## 2023-11-14 MED ORDER — ATORVASTATIN CALCIUM 40 MG PO TABS
80.0000 mg | ORAL_TABLET | Freq: Every evening | ORAL | Status: DC
  Administered 2023-11-14 – 2023-11-16 (×3): 80 mg via ORAL
  Filled 2023-11-14: qty 2
  Filled 2023-11-14 (×2): qty 4

## 2023-11-14 MED ORDER — DEXAMETHASONE SODIUM PHOSPHATE 10 MG/ML IJ SOLN
INTRAMUSCULAR | Status: DC | PRN
  Administered 2023-11-14: 6 mg via INTRAVENOUS

## 2023-11-14 MED ORDER — CHLORHEXIDINE GLUCONATE CLOTH 2 % EX PADS: 6.0000 | MEDICATED_PAD | Freq: Once | CUTANEOUS | Status: DC

## 2023-11-14 MED ORDER — SODIUM CHLORIDE 0.9 % IV SOLN
2.0000 g | INTRAVENOUS | Status: AC
  Administered 2023-11-14: 2 g via INTRAVENOUS
  Filled 2023-11-14: qty 2

## 2023-11-14 MED ORDER — STERILE WATER FOR INJECTION IJ SOLN
INTRAMUSCULAR | Status: AC
  Filled 2023-11-14: qty 20

## 2023-11-14 MED ORDER — HYDROMORPHONE HCL 1 MG/ML IJ SOLN
INTRAMUSCULAR | Status: DC | PRN
  Administered 2023-11-14: 1 mg via INTRAVENOUS

## 2023-11-14 MED ORDER — MEPERIDINE HCL 50 MG/ML IJ SOLN: 6.2500 mg | INTRAMUSCULAR | Status: DC | PRN

## 2023-11-14 MED ORDER — ONDANSETRON HCL 4 MG PO TABS: 4.0000 mg | ORAL_TABLET | Freq: Four times a day (QID) | ORAL | Status: DC | PRN

## 2023-11-14 MED ORDER — ENSURE PRE-SURGERY PO LIQD
296.0000 mL | Freq: Once | ORAL | Status: DC
  Filled 2023-11-14: qty 296

## 2023-11-14 MED ORDER — OXYCODONE HCL 5 MG PO TABS: 5.0000 mg | ORAL_TABLET | Freq: Once | ORAL | Status: DC | PRN

## 2023-11-14 MED ORDER — INDOCYANINE GREEN 25 MG IV SOLR
INTRAVENOUS | Status: DC | PRN
  Administered 2023-11-14: 2.5 mg via INTRAVENOUS

## 2023-11-14 MED ORDER — DIPHENHYDRAMINE HCL 12.5 MG/5ML PO ELIX: 12.5000 mg | ORAL_SOLUTION | Freq: Four times a day (QID) | ORAL | Status: DC | PRN

## 2023-11-14 MED ORDER — CHLORHEXIDINE GLUCONATE 0.12 % MT SOLN: 15.0000 mL | Freq: Once | OROMUCOSAL | Status: DC

## 2023-11-14 MED ORDER — MIDAZOLAM HCL 2 MG/2ML IJ SOLN
INTRAMUSCULAR | Status: AC
Start: 2023-11-14 — End: ?
  Filled 2023-11-14: qty 2

## 2023-11-14 MED ORDER — BUPIVACAINE-EPINEPHRINE 0.25% -1:200000 IJ SOLN
INTRAMUSCULAR | Status: AC
  Filled 2023-11-14: qty 1

## 2023-11-14 MED ORDER — BISACODYL 5 MG PO TBEC: 20.0000 mg | DELAYED_RELEASE_TABLET | Freq: Once | ORAL | Status: DC

## 2023-11-14 MED ORDER — PROPOFOL 10 MG/ML IV BOLUS
INTRAVENOUS | Status: AC
Start: 2023-11-14 — End: ?
  Filled 2023-11-14: qty 20

## 2023-11-14 MED ORDER — OXYCODONE HCL 5 MG/5ML PO SOLN: 5.0000 mg | Freq: Once | ORAL | Status: DC | PRN

## 2023-11-14 MED ORDER — 0.9 % SODIUM CHLORIDE (POUR BTL) OPTIME
TOPICAL | Status: DC | PRN
  Administered 2023-11-14: 1000 mL

## 2023-11-14 MED ORDER — ALVIMOPAN 12 MG PO CAPS
12.0000 mg | ORAL_CAPSULE | ORAL | Status: AC
  Administered 2023-11-14: 12 mg via ORAL
  Filled 2023-11-14: qty 1

## 2023-11-14 MED ORDER — DEXAMETHASONE SODIUM PHOSPHATE 10 MG/ML IJ SOLN
INTRAMUSCULAR | Status: AC
  Filled 2023-11-14: qty 1

## 2023-11-14 MED ORDER — SIMETHICONE 80 MG PO CHEW
40.0000 mg | CHEWABLE_TABLET | Freq: Four times a day (QID) | ORAL | Status: DC | PRN
  Administered 2023-11-15 (×2): 40 mg via ORAL
  Filled 2023-11-14 (×2): qty 1

## 2023-11-14 MED ORDER — LIDOCAINE HCL (CARDIAC) PF 100 MG/5ML IV SOSY
PREFILLED_SYRINGE | INTRAVENOUS | Status: DC | PRN
  Administered 2023-11-14: 4 mL via INTRAVENOUS

## 2023-11-14 MED ORDER — BIOTIN 5000 MCG PO CAPS: 5000.0000 ug | ORAL_CAPSULE | Freq: Every day | ORAL | Status: DC

## 2023-11-14 MED ORDER — HYDROCHLOROTHIAZIDE 25 MG PO TABS
25.0000 mg | ORAL_TABLET | Freq: Every day | ORAL | Status: DC
  Administered 2023-11-15 – 2023-11-17 (×3): 25 mg via ORAL
  Filled 2023-11-14 (×3): qty 1

## 2023-11-14 MED ORDER — ONDANSETRON HCL 4 MG/2ML IJ SOLN
INTRAMUSCULAR | Status: AC
  Filled 2023-11-14: qty 2

## 2023-11-14 MED ORDER — HYDROMORPHONE HCL 1 MG/ML IJ SOLN: 0.5000 mg | INTRAMUSCULAR | Status: DC | PRN

## 2023-11-14 MED ORDER — MIDAZOLAM HCL 5 MG/5ML IJ SOLN
INTRAMUSCULAR | Status: DC | PRN
  Administered 2023-11-14: 1 mg via INTRAVENOUS

## 2023-11-14 MED ORDER — CHLORHEXIDINE GLUCONATE 0.12 % MT SOLN
15.0000 mL | Freq: Once | OROMUCOSAL | Status: AC
  Administered 2023-11-14: 15 mL via OROMUCOSAL

## 2023-11-14 MED ORDER — LISINOPRIL-HYDROCHLOROTHIAZIDE 20-25 MG PO TABS: 1.0000 | ORAL_TABLET | Freq: Every day | ORAL | Status: DC

## 2023-11-14 MED ORDER — MONTELUKAST SODIUM 10 MG PO TABS
10.0000 mg | ORAL_TABLET | Freq: Every day | ORAL | Status: DC
  Administered 2023-11-14 – 2023-11-16 (×3): 10 mg via ORAL
  Filled 2023-11-14 (×3): qty 1

## 2023-11-14 MED ORDER — ONDANSETRON HCL 4 MG/2ML IJ SOLN
INTRAMUSCULAR | Status: DC | PRN
  Administered 2023-11-14: 4 mg via INTRAVENOUS

## 2023-11-14 MED ORDER — LEVOTHYROXINE SODIUM 112 MCG PO TABS
112.0000 ug | ORAL_TABLET | Freq: Every day | ORAL | Status: DC
  Administered 2023-11-15 – 2023-11-17 (×3): 112 ug via ORAL
  Filled 2023-11-14 (×3): qty 1

## 2023-11-14 MED ORDER — MELATONIN 5 MG PO TABS
10.0000 mg | ORAL_TABLET | Freq: Every evening | ORAL | Status: DC | PRN
  Administered 2023-11-15 – 2023-11-16 (×2): 10 mg via ORAL
  Filled 2023-11-14 (×2): qty 2

## 2023-11-14 MED ORDER — LIDOCAINE HCL (PF) 2 % IJ SOLN
INTRAMUSCULAR | Status: AC
  Filled 2023-11-14: qty 5

## 2023-11-14 MED ORDER — PANTOPRAZOLE SODIUM 40 MG PO TBEC
40.0000 mg | DELAYED_RELEASE_TABLET | Freq: Every day | ORAL | Status: DC
  Administered 2023-11-15 – 2023-11-17 (×3): 40 mg via ORAL
  Filled 2023-11-14 (×3): qty 1

## 2023-11-14 MED ORDER — TRAMADOL HCL 50 MG PO TABS
50.0000 mg | ORAL_TABLET | Freq: Four times a day (QID) | ORAL | 0 refills | Status: AC | PRN
  Filled 2023-11-14: qty 15, 4d supply, fill #0

## 2023-11-14 MED ORDER — ACETAMINOPHEN 500 MG PO TABS
1000.0000 mg | ORAL_TABLET | Freq: Four times a day (QID) | ORAL | Status: DC
  Administered 2023-11-14 – 2023-11-17 (×10): 1000 mg via ORAL
  Filled 2023-11-14 (×10): qty 2

## 2023-11-14 MED ORDER — PROPOFOL 500 MG/50ML IV EMUL
INTRAVENOUS | Status: DC | PRN
  Administered 2023-11-14: 75 ug/kg/min via INTRAVENOUS

## 2023-11-14 MED ORDER — DORZOLAMIDE HCL-TIMOLOL MAL 2-0.5 % OP SOLN
1.0000 [drp] | Freq: Two times a day (BID) | OPHTHALMIC | Status: DC
  Administered 2023-11-14 – 2023-11-16 (×5): 1 [drp] via OPHTHALMIC
  Filled 2023-11-14: qty 10

## 2023-11-14 MED ORDER — KETOROLAC TROMETHAMINE 30 MG/ML IJ SOLN
INTRAMUSCULAR | Status: DC | PRN
  Administered 2023-11-14: 30 mg via INTRAVENOUS

## 2023-11-14 MED ORDER — HYDROMORPHONE HCL 1 MG/ML IJ SOLN
INTRAMUSCULAR | Status: AC
  Filled 2023-11-14: qty 1

## 2023-11-14 MED ORDER — HYDRALAZINE HCL 20 MG/ML IJ SOLN: 10.0000 mg | INTRAMUSCULAR | Status: DC | PRN

## 2023-11-14 MED ORDER — ALVIMOPAN 12 MG PO CAPS: 12.0000 mg | ORAL_CAPSULE | Freq: Two times a day (BID) | ORAL | Status: DC

## 2023-11-14 MED ORDER — LACTATED RINGERS IV SOLN: INTRAVENOUS | Status: DC

## 2023-11-14 MED ORDER — ENSURE PRE-SURGERY PO LIQD
592.0000 mL | Freq: Once | ORAL | Status: DC
  Filled 2023-11-14: qty 592

## 2023-11-14 MED ORDER — ENSURE SURGERY PO LIQD
237.0000 mL | Freq: Two times a day (BID) | ORAL | Status: DC
  Administered 2023-11-14 – 2023-11-16 (×5): 237 mL via ORAL

## 2023-11-14 MED ORDER — METRONIDAZOLE 500 MG PO TABS
1000.0000 mg | ORAL_TABLET | ORAL | Status: DC
  Filled 2023-11-14: qty 2

## 2023-11-14 MED ORDER — INSULIN ASPART 100 UNIT/ML IJ SOLN: 0.0000 [IU] | Freq: Every day | INTRAMUSCULAR | Status: DC

## 2023-11-14 MED ORDER — INSULIN ASPART 100 UNIT/ML IJ SOLN: 0.0000 [IU] | INTRAMUSCULAR | Status: DC | PRN

## 2023-11-14 MED ORDER — HEPARIN SODIUM (PORCINE) 5000 UNIT/ML IJ SOLN
5000.0000 [IU] | Freq: Three times a day (TID) | INTRAMUSCULAR | Status: DC
  Administered 2023-11-14 – 2023-11-17 (×9): 5000 [IU] via SUBCUTANEOUS
  Filled 2023-11-14 (×9): qty 1

## 2023-11-14 MED ORDER — ORAL CARE MOUTH RINSE: 15.0000 mL | Freq: Once | OROMUCOSAL | Status: DC

## 2023-11-14 MED ORDER — LATANOPROST 0.005 % OP SOLN
1.0000 [drp] | Freq: Every day | OPHTHALMIC | Status: DC
  Administered 2023-11-14 – 2023-11-16 (×3): 1 [drp] via OPHTHALMIC
  Filled 2023-11-14: qty 2.5

## 2023-11-14 MED ORDER — HYDROMORPHONE HCL 1 MG/ML IJ SOLN
0.2500 mg | INTRAMUSCULAR | Status: DC | PRN
  Administered 2023-11-14 (×3): 0.5 mg via INTRAVENOUS

## 2023-11-14 MED ORDER — FENTANYL CITRATE (PF) 100 MCG/2ML IJ SOLN
INTRAMUSCULAR | Status: DC | PRN
  Administered 2023-11-14: 100 ug via INTRAVENOUS
  Administered 2023-11-14 (×2): 50 ug via INTRAVENOUS

## 2023-11-14 SURGICAL SUPPLY — 97 items
APPLIER CLIP 5 13 M/L LIGAMAX5 (MISCELLANEOUS) IMPLANT
APPLIER CLIP ROT 10 11.4 M/L (STAPLE) IMPLANT
BAG COUNTER SPONGE SURGICOUNT (BAG) IMPLANT
BLADE EXTENDED COATED 6.5IN (ELECTRODE) ×2 IMPLANT
CANNULA REDUCER 12-8 DVNC XI (CANNULA) ×2 IMPLANT
CHLORAPREP W/TINT 26 (MISCELLANEOUS) ×2 IMPLANT
CLIP APPLIE 5 13 M/L LIGAMAX5 (MISCELLANEOUS) IMPLANT
CLIP APPLIE ROT 10 11.4 M/L (STAPLE) IMPLANT
CLIP LIGATING HEM O LOK PURPLE (MISCELLANEOUS) IMPLANT
CLIP LIGATING HEMO O LOK GREEN (MISCELLANEOUS) IMPLANT
COVER SURGICAL LIGHT HANDLE (MISCELLANEOUS) ×4 IMPLANT
COVER TIP SHEARS 8 DVNC (MISCELLANEOUS) ×2 IMPLANT
DEFOGGER SCOPE WARMER CLEARIFY (MISCELLANEOUS) ×2 IMPLANT
DERMABOND ADVANCED .7 DNX12 (GAUZE/BANDAGES/DRESSINGS) IMPLANT
DEVICE TROCAR PUNCTURE CLOSURE (ENDOMECHANICALS) IMPLANT
DRAIN CHANNEL 19F RND (DRAIN) ×2 IMPLANT
DRAPE ARM DVNC X/XI (DISPOSABLE) ×8 IMPLANT
DRAPE COLUMN DVNC XI (DISPOSABLE) ×2 IMPLANT
DRAPE SURG IRRIG POUCH 19X23 (DRAPES) ×2 IMPLANT
DRIVER NDL LRG 8 DVNC XI (INSTRUMENTS) ×2 IMPLANT
DRIVER NDLE LRG 8 DVNC XI (INSTRUMENTS) ×2 IMPLANT
DRSG OPSITE POSTOP 4X10 (GAUZE/BANDAGES/DRESSINGS) IMPLANT
DRSG OPSITE POSTOP 4X6 (GAUZE/BANDAGES/DRESSINGS) IMPLANT
DRSG OPSITE POSTOP 4X8 (GAUZE/BANDAGES/DRESSINGS) IMPLANT
DRSG TEGADERM 2-3/8X2-3/4 SM (GAUZE/BANDAGES/DRESSINGS) ×10 IMPLANT
DRSG TEGADERM 4X4.75 (GAUZE/BANDAGES/DRESSINGS) ×2 IMPLANT
ELECT REM PT RETURN 15FT ADLT (MISCELLANEOUS) ×2 IMPLANT
ENDOLOOP SUT PDS II 0 18 (SUTURE) IMPLANT
EVACUATOR SILICONE 100CC (DRAIN) ×2 IMPLANT
GAUZE SPONGE 2X2 8PLY STRL LF (GAUZE/BANDAGES/DRESSINGS) ×2 IMPLANT
GAUZE SPONGE 4X4 12PLY STRL (GAUZE/BANDAGES/DRESSINGS) IMPLANT
GLOVE BIO SURGEON STRL SZ7.5 (GLOVE) ×6 IMPLANT
GLOVE INDICATOR 8.0 STRL GRN (GLOVE) ×6 IMPLANT
GOWN SRG XL LVL 4 BRTHBL STRL (GOWNS) ×2 IMPLANT
GOWN STRL REUS W/ TWL XL LVL3 (GOWN DISPOSABLE) ×10 IMPLANT
GRASPER SUT TROCAR 14GX15 (MISCELLANEOUS) IMPLANT
GRASPER TIP-UP FEN DVNC XI (INSTRUMENTS) ×2 IMPLANT
HOLDER FOLEY CATH W/STRAP (MISCELLANEOUS) ×2 IMPLANT
IRRIG SUCT STRYKERFLOW 2 WTIP (MISCELLANEOUS) ×2 IMPLANT
IRRIGATION SUCT STRKRFLW 2 WTP (MISCELLANEOUS) ×2 IMPLANT
KIT PROCEDURE DVNC SI (MISCELLANEOUS) IMPLANT
KIT TURNOVER KIT A (KITS) IMPLANT
NDL INSUFFLATION 14GA 120MM (NEEDLE) ×2 IMPLANT
NEEDLE INSUFFLATION 14GA 120MM (NEEDLE) ×2 IMPLANT
PACK CARDIOVASCULAR III (CUSTOM PROCEDURE TRAY) ×2 IMPLANT
PACK COLON (CUSTOM PROCEDURE TRAY) ×2 IMPLANT
PAD POSITIONING PINK XL (MISCELLANEOUS) ×2 IMPLANT
PENCIL SMOKE EVACUATOR (MISCELLANEOUS) IMPLANT
PROTECTOR NERVE ULNAR (MISCELLANEOUS) ×4 IMPLANT
RELOAD STAPLE 45 3.5 BLU DVNC (STAPLE) IMPLANT
RELOAD STAPLE 45 4.3 GRN DVNC (STAPLE) IMPLANT
RELOAD STAPLE 60 3.5 BLU DVNC (STAPLE) IMPLANT
RELOAD STAPLE 60 4.3 GRN DVNC (STAPLE) IMPLANT
RETRACTOR WND ALEXIS 18 MED (MISCELLANEOUS) IMPLANT
RTRCTR WOUND ALEXIS 18CM MED (MISCELLANEOUS) IMPLANT
SCISSORS LAP 5X35 DISP (ENDOMECHANICALS) IMPLANT
SCISSORS MNPLR CVD DVNC XI (INSTRUMENTS) ×2 IMPLANT
SEAL UNIV 5-12 XI (MISCELLANEOUS) ×8 IMPLANT
SEALER VESSEL EXT DVNC XI (MISCELLANEOUS) ×2 IMPLANT
SLEEVE ADV FIXATION 5X100MM (TROCAR) IMPLANT
SOL ELECTROSURG ANTI STICK (MISCELLANEOUS) ×2 IMPLANT
SOLUTION ELECTROSURG ANTI STCK (MISCELLANEOUS) ×2 IMPLANT
SPIKE FLUID TRANSFER (MISCELLANEOUS) ×2 IMPLANT
STAPLER 60 SUREFORM DVNC (STAPLE) IMPLANT
STAPLER ECHELON POWER CIR 29 (STAPLE) IMPLANT
STAPLER ECHELON POWER CIR 31 (STAPLE) IMPLANT
STAPLER RELOAD 3.5X45 BLU DVNC (STAPLE) IMPLANT
STAPLER RELOAD 3.5X60 BLU DVNC (STAPLE) IMPLANT
STAPLER RELOAD 4.3X45 GRN DVNC (STAPLE) IMPLANT
STAPLER RELOAD 4.3X60 GRN DVNC (STAPLE) ×4 IMPLANT
STOPCOCK 4 WAY LG BORE MALE ST (IV SETS) ×4 IMPLANT
SURGILUBE 2OZ TUBE FLIPTOP (MISCELLANEOUS) ×2 IMPLANT
SUT MNCRL AB 4-0 PS2 18 (SUTURE) ×2 IMPLANT
SUT PDS AB 1 CT1 27 (SUTURE) IMPLANT
SUT PDS AB 1 TP1 96 (SUTURE) IMPLANT
SUT PROLENE 0 CT 2 (SUTURE) IMPLANT
SUT PROLENE 2 0 KS (SUTURE) ×2 IMPLANT
SUT PROLENE 2 0 SH DA (SUTURE) IMPLANT
SUT SILK 2 0 SH CR/8 (SUTURE) IMPLANT
SUT SILK 2-0 18XBRD TIE 12 (SUTURE) IMPLANT
SUT SILK 3 0 SH CR/8 (SUTURE) ×2 IMPLANT
SUT SILK 3-0 18XBRD TIE 12 (SUTURE) ×2 IMPLANT
SUT V-LOC BARB 180 2/0GR6 GS22 (SUTURE) IMPLANT
SUT VIC AB 3-0 SH 18 (SUTURE) IMPLANT
SUT VIC AB 3-0 SH 27XBRD (SUTURE) IMPLANT
SUT VICRYL 0 UR6 27IN ABS (SUTURE) ×2 IMPLANT
SUTURE V-LC BRB 180 2/0GR6GS22 (SUTURE) IMPLANT
SYR 10ML LL (SYRINGE) ×2 IMPLANT
SYS LAPSCP GELPORT 120MM (MISCELLANEOUS) IMPLANT
SYS WOUND ALEXIS 18CM MED (MISCELLANEOUS) ×2 IMPLANT
SYSTEM LAPSCP GELPORT 120MM (MISCELLANEOUS) IMPLANT
SYSTEM WOUND ALEXIS 18CM MED (MISCELLANEOUS) ×2 IMPLANT
TAPE UMBILICAL 1/8 X36 TWILL (MISCELLANEOUS) ×2 IMPLANT
TRAY FOLEY MTR SLVR 16FR STAT (SET/KITS/TRAYS/PACK) ×2 IMPLANT
TROCAR ADV FIXATION 5X100MM (TROCAR) ×2 IMPLANT
TUBING CONNECTING 10 (TUBING) ×6 IMPLANT
TUBING INSUFFLATION 10FT LAP (TUBING) ×2 IMPLANT

## 2023-11-14 NOTE — Progress Notes (Signed)
 PHARMACIST - PHYSICIAN ORDER COMMUNICATION  CONCERNING: P&T Medication Policy on Herbal Medications  DESCRIPTION:  This patient's order for:  Biotin   has been noted.  This product(s) is classified as an "herbal" or natural product. Due to a lack of definitive safety studies or FDA approval, nonstandard manufacturing practices, plus the potential risk of unknown drug-drug interactions while on inpatient medications, the Pharmacy and Therapeutics Committee does not permit the use of "herbal" or natural products of this type within [facility].   ACTION TAKEN: The pharmacy department is unable to verify this order at this time . Please reevaluate patient's clinical condition at discharge and address if the herbal or natural product(s) should be resumed at that time.  Landy Veva CROME PharmD 11/14/2023, 12:53 PM

## 2023-11-14 NOTE — Anesthesia Procedure Notes (Signed)
 Procedure Name: Intubation Date/Time: 11/14/2023 8:40 AM  Performed by: Vinita Geanie LABOR, CRNAPre-anesthesia Checklist: Patient identified, Emergency Drugs available, Suction available and Patient being monitored Patient Re-evaluated:Patient Re-evaluated prior to induction Oxygen Delivery Method: Circle system utilized Preoxygenation: Pre-oxygenation with 100% oxygen Induction Type: IV induction Ventilation: Mask ventilation without difficulty Laryngoscope Size: Mac and 3 Grade View: Grade I Tube type: Oral Tube size: 7.0 mm Number of attempts: 1 Airway Equipment and Method: Stylet and Oral airway Placement Confirmation: ETT inserted through vocal cords under direct vision, positive ETCO2 and breath sounds checked- equal and bilateral Secured at: 21 cm Tube secured with: Tape Dental Injury: Teeth and Oropharynx as per pre-operative assessment

## 2023-11-14 NOTE — Anesthesia Postprocedure Evaluation (Signed)
 Anesthesia Post Note  Patient: Sonya Brady  Procedure(s) Performed: XI ROBOTIC ASSISTED LOWER ANTERIOR RESECTION WITH COLORECTAL ANASTOMOSIS, ICG PERFUSION ASSESSMENT, BILATERAL TAP BLOCK (Abdomen) FLEXIBLE SIGMOIDOSCOPY, (Rectum)     Patient location during evaluation: PACU Anesthesia Type: General Level of consciousness: awake and alert Pain management: pain level controlled Vital Signs Assessment: post-procedure vital signs reviewed and stable Respiratory status: spontaneous breathing, nonlabored ventilation and respiratory function stable Cardiovascular status: blood pressure returned to baseline and stable Postop Assessment: no apparent nausea or vomiting Anesthetic complications: no   No notable events documented.  Last Vitals:  Vitals:   11/14/23 1215 11/14/23 1216  BP: 132/73   Pulse: (!) 59 (!) 58  Resp: 12 11  Temp:    SpO2: 100% 99%    Last Pain:  Vitals:   11/14/23 1216  TempSrc:   PainSc: 6                  Butler Levander Pinal

## 2023-11-14 NOTE — Op Note (Signed)
 PATIENT: Sonya Brady  73 y.o. female  Patient Care Team: Rollene Almarie LABOR, MD as PCP - General (Internal Medicine) Jakie Alm SAUNDERS, MD (Gastroenterology)  PREOP DIAGNOSIS: recurrent diverticulitis  POSTOP DIAGNOSIS: recurrent diverticulitis  PROCEDURE:  Robotic assisted low anterior resection with double stapled colorectal anastomosis Intraoperative assessment of perfusion using ICG fluorescence imaging Flexible sigmoidoscopy Bilateral transversus abdominus plane (TAP) blocks  SURGEON: Lonni HERO. Brieonna Crutcher, MD  ASSISTANT: Bernarda Ned, MD  An experienced assistant was required given the complexity of this procedure and the standard of surgical care. My assistant helped with exposure through counter tension, suctioning, ligation and retraction to better visualize the surgical field. My assistant expedited sewing during the case by following my sutures. Wherever I use the term we in the report, my assistant actively helped me with that portion of the procedure.   ANESTHESIA: General endotracheal  EBL: 15 mL Total I/O In: -  Out: 165 [Urine:150; Blood:15]  DRAINS: none  SPECIMEN: Rectosigmoid colon - open end proximal  COUNTS: Sponge, needle and instrument counts were reported correct x2  FINDINGS: Dense chronic diverticular change/fibrosis involving the mid to distal sigmoid colon.  There is associated fibrotic type changes of the distal sigmoid mesocolon as well.  This extended all way down to the proximalmost aspect of the mesorectum.  Therefore, a rectosigmoid resection with a double stapled colorectal anastomosis is necessary to remove all diseased portions of the colon.  A well perfused, tension free, hemostatic, air tight 29 mm EEA colorectal anastomosis fashioned 15 cm from the anal verge by flexible sigmoidoscopy.   NARRATIVE: Informed consent was verified. The patient was taken to the operating room, placed supine on the operating table and SCD's were  applied. General endotracheal anesthesia was induced without difficulty. She was then positioned in the lithotomy position with Allen stirrups.  Pressure points were evaluated and padded.  A foley catheter was then placed by nursing under sterile conditions. Hair on the abdomen was clipped.  She was secured to the operating table. The abdomen was then prepped and draped in the standard sterile fashion. Surgical timeout was called indicating the correct patient, procedure, positioning and need for preoperative antibiotics.   An OG tube was placed by anesthesia and confirmed to be to suction.  At Palmer's point, a stab incision was created and the Veress needle was introduced into the peritoneal cavity on the first attempt.  Intraperitoneal location was confirmed by the aspiration and saline drop test.  Pneumoperitoneum was established to a maximum pressure of 15 mmHg using CO2.  Following this, the abdomen was marked for planned trocar sites.  Just to the right and cephalad to the umbilicus, an 8 mm incision was created and an 8 mm blunt tipped robotic trocar was cautiously placed into the peritoneal cavity.  The laparoscope was inserted and demonstrated no evidence of trocar site nor Veress needle site complications.  The Veress needle was removed.  Bilateral transversus abdominis plane blocks were then created using a dilute mixture of Exparel  with Marcaine .  3 additional 8 mm robotic trochars were placed under direct visualization roughly in a line extending from the right ASIS towards the left upper quadrant. The bladder was inspected and noted to be at/below the pubic symphysis.  Staying 3 fingerbreadths above the pubic symphysis, an incision was created and the 12 mm robotic trocar inserted directed cephalad into the peritoneal cavity under direct visualization.  An additional 5 mm assist port was placed in the right lateral abdomen under direct  visualization.  The abdomen was surveyed and there was fibrosis  and thickening of the distal sigmoid colon going down to the level the proximal rectum with sigmoidal adhesions to the associated sigmoid mesocolon.  Remainder of the abdomen is unremarkable on inspection.  She was positioned in Trendelenburg with the left side tilted slightly up.  Small bowel was carefully retracted out of the pelvis.  The robot was then docked and I went to the console.   The sigmoid colon was readily identified. Attachments of the sigmoid colon were taken down from the intersigmoid fossa.  The rectosigmoid colon was grasped and elevated anteriorly.  Beginning with a medial to lateral approach, the peritoneum overlying the presacral space was carefully incised.  The TME plane was readily gained working in a plane between the fascia propria of the rectum and the presacral fascia.  Hypogastric nerves were seen going along the the presacral fascia and were protected free of injury.  Working more proximally, the mesorectum and sigmoid mesentery were carefully mobilized off of the peritoneum.  The left ureter was identified and protected free of injury.  The left gonadal vessels were identified and protected.  These were both swept down.  The superior hemorrhoidal and IMA pedicles were identified.  There is fibrotic type changes within the mesentery at this location related to her prior bouts of diverticulitis.  Further mesocolon was mobilized proximally staying in this plane between the retroperitoneum proper and the mesocolon. Attention was then turned to the lateral portion of dissection.  The sigmoid colon was then retracted to the right.  The sigmoid colon was fully mobilized. The descending colon was mobilized by incising the Calandra Madura line of Toldt.  This was done all the way up to the level of the splenic flexure.  The associated mesocolon was also mobilized medially.  The left ureter again was confirmed to be well away from the vasculature which had been dissected medially.  The rectosigmoid  colon was elevated anteriorly. The left ureter was re-identified. The IMA was clear of this. The IMA was then divided with the vessel sealer. The stump was inspected and noted to be completely hemostatic with a good seal.  The mesentery was divided out to the point of planned proximal division.  Working more distally, the rectum was identified where the tinea had splayed and there were loss of appendices epiploica.  The fibrotic/diverticular type changes involving the mesocolon and proximalmost mesorectum extend all the way down to this level, therefore a low anterior resection was necessary.  This also corresponded to a location overlying the sacral promontory.  Anatomically, this clearly represents the proximal rectum.  The mesentery out to this level was then cleared using the vessel sealer. The distal point of transection on the proximal rectum was identified.   A 60 mm green load robotic stapler was then placed through the 12 mm port and introduced into the peritoneal cavity.  The rectum was divided with essentially a single firing of the stapler. One additional load was however used to take care of the lateral most corner.  The stump is with well-formed staples and healthy in appearance.   Attention was turned to performing a perfusion test. ICG was administered by anesthesia and at the level of the cleared mesentery proximally, there was excellent uptake of the tracer.  The rectum was also well perfused in appearance.  There was a visible pulse in the mesentery out to the level of the cleared colon at the level of the proximal  sigmoid/descending colon junction.  This colon is also supple and healthy in appearance without any thickening.  This reached into the pelvis without any difficulty and remained in that location without any tension. A locking grasper was then placed on the sigmoid staple line.   Attention was turned to the extracorporeal portion of the procedure.  The robot was undocked.  I  scrubbed back in.  Using the 12 mm trocar site, a Pfannenstiel incision was created and incorporated the fascial opening through the 12 mm port site.  The rectus fascia was incised and then elevated.  The rectus muscle was mobilized free of the overlying fascia.  The peritoneum was incised in the midline well above the location of the bladder.  An Alexis wound protector was placed.  Towels were placed around the field.  The divided colon was passed through the wound protector.  The point of proximal division was identified and was again on a healthy segment of supple colon with a palpable pulse in the mesentery. This was pink in color.  A pursestring device was applied.  A 2-0 Prolene on a Keith needle was passed.  The colon was divided and passed off with the open end being proximal.  EEA sizers were then introduced and a 29 mm EEA selected.  Belt loops consisting of 3-0 silk were placed around the pursestring suture line.  The anvil was placed and the pursestring tied.  A small amount of fat was cleared from the planned anastomosis and no diverticula were apparent within this.  This was placed back into the abdomen and a cap placed over the wound protector port site.  Pneumoperitoneum was reestablished.  I then went below to pass the stapler.  My partner remained above.  EEA sizers were cautiously introduced via the anus and advanced under direct visualization.  The stapler was passed and the spike deployed just anterior to the staple line.  The components were then mated.  Orientation was confirmed such that there is no twisting of the colon nor small bowel underneath the mesenteric defect. Care was taken to ensure no other structures were incorporated within this either.  The stapler was then closed, held, and fired. This was then removed. The donuts were inspected and noted to be complete.  The colon proximal to the anastomosis was then gently occluded. The pelvis was filled with sterile irrigation. Under  direct visualization, I passed a flexible sigmoidoscope.  The anastomosis was under water .  With good distention of the anastomosis there was no air leak. The anastomosis pink in appearance.  This is located at 15 cm from the anal verge by flexible sigmoidoscopy.  It is hemostatic.  Additionally, looking from above, there is no tension on the colon or mesentery.  Sigmoidoscope was withdrawn.  Irrigation was evacuated from the pelvis.  The abdomen and pelvis are surveyed and noted to be completely hemostatic without any apparent injury.  Under direct visualization, all trochars are removed.  The Alexis wound protector was removed.  Gowns/gloves are changed and a fresh set of clean instruments utilized. Additional sterile drapes were placed around the field.   The Pfannenstiel peritoneum was closed with a running 2-0 Vicryl suture.  The rectus fascia was then closed using 2 running #1 PDS sutures.  The fascia was then palpated and noted to be completely closed.  Additional anesthetic was infiltrated at the Pfannenstiel site.  Sponge, needle, and instrument counts were reported correct x2. 4-0 Monocryl subcuticular suture was used to close the  skin of all incision sites.  Dermabond was placed over all incisions.  A honeycomb dressing placed over the Pfannenstiel as well.   She was then taken out of lithotomy, awakened from anesthesia, extubated, and transferred to a stretcher for transport to PACU in satisfactory condition having tolerated the procedure well.

## 2023-11-14 NOTE — H&P (Signed)
 CC: Here today for surgery  HPI: Sonya Brady is an 73 y.o. female with history of DM, HTN, HLD, GERD, hypothyroidism,, whom is seen in the office today for evaluation of recurrent diverticulitis.   Last colonoscopy 01/23/2017 with Dr. Abran: - Two 1-2 mm polyps in sigmoid and proximal transverse colon, removed. - Diverticulosis in the left and right colon - Exam otherwise normal.  From an imaging perspective, she has multiple bouts of diverticulitis documented.  CT A/P 07/2010: Focal short segmental sigmoid colonic wall thickening without  evidence for free air or pericolonic abscess. Findings most likely  signify diverticulitis.   CT A/P 11/14/2017 for lower abdominal pain/nausea: 1. Distal sigmoid diverticulitis without abscess or perforation. There is diverticulosis throughout the sigmoid and descending colon regions without diverticulitis in these regions. 2. Cholelithiasis. No gallbladder wall thickening evident. 3. No abscess in the abdomen or pelvis. Appendix appears normal. No bowel obstruction. 4. Wall thickening in the urinary bladder. Suspect a degree of cystitis. No hydronephrosis on either side. No renal or ureteral calculus on either side. 5. Paget's disease involving the right iliac bone and acetabulum, present previously. 6. Hemangioma T12 vertebral body, present previously. 7. Aortoiliac atherosclerosis. 8. Uterus absent. 9. Small ventral hernia containing only fat.  CT A/P 09/30/20 1. Descending and sigmoid diverticulosis without evidence of acute diverticulitis. 2. No acute CT findings of the abdomen or pelvis to explain pain. 3. Cholelithiasis. 4. Status post hysterectomy.  CT A/P 08/04/23 1. Pancolonic diverticulosis. Relatively focal circumferential wall thickening and adjacent fat stranding of the mid sigmoid colon, segment 5 cm in length, consistent with acute uncomplicated diverticulitis. Underlying mass not excluded. Consider colonoscopy at  the resolution of acute clinical presentation to exclude underlying mass. 2. Cholelithiasis without evidence of acute cholecystitis. 3. Coronary artery disease.  CT A/P 08/06/23 1. Worsening acute uncomplicated diverticulitis of the sigmoid colon. No perforation, fluid collection, or abscess. Follow-up recommended to document resolution and exclude underlying colonic mass. 2. Cholelithiasis without cholecystitis. 3. Aortic Atherosclerosis (ICD10-I70.0).  She reports that she first experienced her first attack of diverticulitis back in 2011 as noted by the CT scan above. At that time she had fairly significant crampy lower abdominal pain, primarily in her lower abdomen. With all of her subsequent attacks which she believes to be in the ballpark of 13 she has experienced left lower quadrant to lower abdominal discomfort. Multiple CT scans have demonstrated findings compatible with this. She has had numerous attacks that were not documented CT but managed with outpatient antibiotics. The attacks will typically last on the order of days and gradually improved with antibiotic therapy. Currently, she reports feeling well. No abdominal pain, nausea, or vomiting. She has attributed blueberries to her most recent attack and is therefore currently avoiding those kinds of foods. She is having regular bowel movements.  She is following with gastroenterology, Dr. Abran Cscope 10/23/23 - Diverticulosis in left and right colon, otherwise normal exam  She denies any changes in health or health history since we met in the office. No new medications/allergies. She states she is ready for surgery today.  PMH: DM, HTN, HLD, GERD, hypothyroidism  PSH: Hysterectomy via Pfannenstiel 25+ years ago at [facility]; hernia-right groin 1980. Right shoulder lipoma, Dr. Gladis. Hemorrhoidectomy in the 1990s while she had her hysterectomy; Dr. Merrilyn.  FHx: Denies any known family history of colorectal, breast,  endometrial or ovarian cancer  Social Hx: Denies use of tobacco/EtOH/illicit drug. She is here today by herself. She is  happily retired for the last 2 to 3 years. She previously worked as an designer, industrial/product for [employer]. She is originally from [place], [place]. Spent some time living in [place] as well. Her son is one of our hospitalists.    Past Medical History:  Diagnosis Date   Acne    Allergy    Anemia    hx of   Arthritis    Carpal tunnel syndrome of left wrist 10/21/2021   Cataract    Complication of anesthesia    could hear sounds with r breast biopsy yrs ago,   Depression    situational when husband was sick and dying.   Diabetes mellitus Type 2    Diverticulosis    GERD (gastroesophageal reflux disease)    Glaucoma    Goiter    benign   right 5.7 cm/3.0x1.8cm left 5.2 x 2.4 x 2.1 cm per 05-09-2019 us  epic no further follow up needed   H. pylori infection    Heart murmur    History of COVID-19 Oct 06, 2019   sick x 1 week all symptoms resolved   Hypercholesteremia    Hypertension    Hypothyroidism    Insomnia    Pneumonia    PONV (postoperative nausea and vomiting)    x 1 yrs ago per pt on 10-24-2021   Tinnitus, left ear 10/24/2021   for last 30 yrs   Unspecified Eustachian tube disorder, left ear     Past Surgical History:  Procedure Laterality Date   ABDOMINAL HYSTERECTOMY     both ovary intact age 64   BREAST LUMPECTOMY Bilateral    benign mmore than 35 yrs ago   CARPAL TUNNEL RELEASE Left 10/27/2021   Procedure: CARPAL TUNNEL RELEASE;  Surgeon: Alyse Agent, MD;  Location: [facility];  Service: Orthopedics;  Laterality: Left;   carpel tunnel surgery Right    right 30 yrs ago per pt on 10-24-2021   CARPOMETACARPEL SUSPENSION PLASTY Left 10/27/2021   Procedure: left thumb carpometacarpal joint arthroplasty with tendon transfer;  Surgeon: Alyse Agent, MD;  Location: [facility];  Service: Orthopedics;  Laterality: Left;   with MAC   EXCISION MASS UPPER EXTREMETIES Right 06/05/2022   Procedure: EXCISION MASS RIGHT SHOULDER;  Surgeon: Gladis Cough, MD;  Location: [facility];  Service: General;  Laterality: Right;   EYE SURGERY     cataracts yrs ago per pt on 10-24-2021   left ear eardrum reconstruction     ponv after 40 yrs ago   right lower abdominal mass benign removed  1978   hernia right  lower abd   TYMPANOPLASTY     tube left ear multiple times ober last 44 years   UPPER GI ENDOSCOPY     Oct 05, 2013 and 10/05/2017    Family History  Problem Relation Age of Onset   Other Father 79       deceased, unknown causes in October 05, 2024   Hypertension Mother    Other Mother 24       SBO, deceased   Stroke Mother    Thyroid  disease Brother    Cancer Brother    Thyroid  disease Sister    Cancer Sister    Breast cancer Neg Hx    Colon cancer Neg Hx    Esophageal cancer Neg Hx    Rectal cancer Neg Hx    Stomach cancer Neg Hx    Diabetes Neg Hx     Social:  reports that she has  never smoked. She has never used smokeless tobacco. She reports that she does not currently use alcohol. She reports that she does not use drugs.  Allergies:  Allergies  Allergen Reactions   Flagyl  [Metronidazole ]     Redness on arms    Medications: I have reviewed the patient's current medications.  Results for orders placed or performed during the hospital encounter of 11/14/23 (from the past 48 hours)  Glucose, capillary     Status: Abnormal   Collection Time: 11/14/23  6:17 AM  Result Value Ref Range   Glucose-Capillary 125 (H) 70 - 99 mg/dL    Comment: Glucose reference range applies only to samples taken after fasting for at least 8 hours.   Comment 1 Notify RN    Comment 2 Document in Chart     No results found.   PE Blood pressure 125/78, pulse 79, temperature 98.2 F (36.8 C), temperature source Oral, resp. rate 16, height 5' 5 (1.651 m), weight 71.7 kg, SpO2 97%. Constitutional: NAD; conversant Eyes:  Moist conjunctiva Lungs: Normal respiratory effort CV: RRR GI: Abd soft, NT/ND Psychiatric: Appropriate affect  Results for orders placed or performed during the hospital encounter of 11/14/23 (from the past 48 hours)  Glucose, capillary     Status: Abnormal   Collection Time: 11/14/23  6:17 AM  Result Value Ref Range   Glucose-Capillary 125 (H) 70 - 99 mg/dL    Comment: Glucose reference range applies only to samples taken after fasting for at least 8 hours.   Comment 1 Notify RN    Comment 2 Document in Chart     No results found.  A/P: Sonya Brady is an 73 y.o. female with hx of DM, HTN, HLD, GERD, hypothyroidism, here for evaluation of multiply recurrent sigmoid diverticulitis  -The anatomy and physiology of the GI tract was reviewed with the patient. The pathophysiology of recurrent diverticulitis was discussed as well with associated pictures.  -With regards to diverticulitis, we did discuss in her case although uncomplicated, she has had numerous attacks totaling at least 13. More recently, they have become more severe. We did discuss however that with surgery for diverticulitis, this is a quality-of-life based decision. We discussed that if the frequency and/or severity of attacks is such that it is impacting her quality life significantly and after considering risk/benefits of surgery, she feels surgery would be most beneficial, we'd certainly be happy to proceed.  -We have discussed various different treatment options going forward including surgery (the most definitive) to address this - robotic assisted low anterior resection with colorectal anastomosis; flexible sigmoidoscopy; ICG perfusion assessment.  -The planned procedures, material risks (including, but not limited to, pain, bleeding, infection, scarring, need for blood transfusion, damage to surrounding structures- blood vessels/nerves/viscus/organs, damage to ureter, urine leak, leak from anastomosis, need for  additional procedures, scenarios where a stoma may be necessary and where it may be permanent, worsening of pre-existing medical conditions, chronic diarrhea, constipation secondary to narcotic use, hernia, recurrence, pneumonia, heart attack, stroke, death) benefits and alternatives to surgery were discussed at length. The patient's questions were answered to her satisfaction, she voiced understanding and elected to proceed with surgery. Additionally, we discussed typical postoperative expectations and the recovery process.   Lonni Pizza, MD [facility], [facility]

## 2023-11-14 NOTE — Transfer of Care (Signed)
 Immediate Anesthesia Transfer of Care Note  Patient: Sonya Brady  Procedure(s) Performed: XI ROBOTIC ASSISTED LOWER ANTERIOR RESECTION WITH COLORECTAL ANASTOMOSIS, ICG PERFUSION ASSESSMENT, BILATERAL TAP BLOCK (Abdomen) FLEXIBLE SIGMOIDOSCOPY, (Rectum)  Patient Location: PACU  Anesthesia Type:General  Level of Consciousness: awake, alert , and oriented  Airway & Oxygen Therapy: Patient Spontanous Breathing and Patient connected to face mask oxygen  Post-op Assessment: Report given to RN and Post -op Vital signs reviewed and stable  Post vital signs: Reviewed and stable  Last Vitals:  Vitals Value Taken Time  BP 156/71 11/14/23 1116  Temp    Pulse 58 11/14/23 1116  Resp 14 11/14/23 1116  SpO2 100 % 11/14/23 1116  Vitals shown include unfiled device data.  Last Pain:  Vitals:   11/14/23 0733  TempSrc:   PainSc: 0-No pain         Complications: No notable events documented.

## 2023-11-14 NOTE — Discharge Instructions (Signed)

## 2023-11-14 NOTE — Plan of Care (Signed)
  Problem: Education: Goal: Verbalization of understanding of the causes of altered bowel function will improve Outcome: Progressing   Problem: Activity: Goal: Ability to tolerate increased activity will improve Outcome: Progressing   Problem: Bowel/Gastric: Goal: Gastrointestinal status for postoperative course will improve Outcome: Progressing

## 2023-11-14 NOTE — Progress Notes (Signed)
 PT Cancellation Note  Patient Details Name: Sonya Brady MRN: [id number] DOB: 01-15-1951   Cancelled Treatment:    Reason Eval/Treat Not Completed: Other (comment) PT orders received, chart reviewed. Pt received asleep in bed, family present requesting PT return tomorrow. Will f/u as able.  Sonya Brady, PT, DPT 11/14/23, 3:00 PM   Sonya Brady 11/14/2023, 3:00 PM

## 2023-11-15 ENCOUNTER — Encounter (HOSPITAL_COMMUNITY): Payer: Self-pay | Admitting: Surgery

## 2023-11-15 LAB — GLUCOSE, CAPILLARY
Glucose-Capillary: 112 mg/dL — ABNORMAL HIGH (ref 70–99)
Glucose-Capillary: 126 mg/dL — ABNORMAL HIGH (ref 70–99)
Glucose-Capillary: 119 mg/dL — ABNORMAL HIGH (ref 70–99)
Glucose-Capillary: 142 mg/dL — ABNORMAL HIGH (ref 70–99)

## 2023-11-15 LAB — SURGICAL PATHOLOGY

## 2023-11-15 LAB — BASIC METABOLIC PANEL
Anion gap: 12 (ref 5–15)
BUN: 16 mg/dL (ref 8–23)
CO2: 23 mmol/L (ref 22–32)
Calcium: 8.8 mg/dL — ABNORMAL LOW (ref 8.9–10.3)
Chloride: 101 mmol/L (ref 98–111)
Creatinine, Ser: 0.61 mg/dL (ref 0.44–1.00)
GFR, Estimated: >60 mL/min (ref >60)
Glucose, Bld: 99 mg/dL (ref 70–99)
Potassium: 2.9 mmol/L — ABNORMAL LOW (ref 3.5–5.1)
Sodium: 136 mmol/L (ref 135–145)

## 2023-11-15 LAB — CBC
HCT: 34.2 % — ABNORMAL LOW (ref 36.0–46.0)
Hemoglobin: 11.5 g/dL — ABNORMAL LOW (ref 12.0–15.0)
MCH: 29.3 pg (ref 26.0–34.0)
MCHC: 33.6 g/dL (ref 30.0–36.0)
MCV: 87 fL (ref 80.0–100.0)
Platelets: 205 10*3/uL (ref 150–400)
RBC: 3.93 MIL/uL (ref 3.87–5.11)
RDW: 12.8 % (ref 11.5–15.5)
WBC: 7.3 10*3/uL (ref 4.0–10.5)
nRBC: 0 % (ref 0.0–0.2)

## 2023-11-15 MED ORDER — POTASSIUM CHLORIDE 10 MEQ/100ML IV SOLN
10.0000 meq | INTRAVENOUS | Status: AC
  Administered 2023-11-15 (×4): 10 meq via INTRAVENOUS
  Filled 2023-11-15 (×4): qty 100

## 2023-11-15 MED ORDER — POTASSIUM CHLORIDE CRYS ER 20 MEQ PO TBCR
20.0000 meq | EXTENDED_RELEASE_TABLET | Freq: Once | ORAL | Status: AC
  Administered 2023-11-15: 20 meq via ORAL
  Filled 2023-11-15: qty 1

## 2023-11-15 NOTE — Progress Notes (Addendum)
  Subjective No acute events. Feeling well. No n/v. Pain controlled. +Flatus and nonbloody BM. Her son is at bedside  Objective: Vital signs in last 24 hours: Temp:  [97.5 F (36.4 C)-97.6 F (36.4 C)] 97.5 F (36.4 C) (02/06 0556) Pulse Rate:  [54-75] 70 (02/06 0556) Resp:  [11-18] 18 (02/06 0556) BP: (108-156)/(61-79) 128/64 (02/06 0556) SpO2:  [95 %-100 %] 100 % (02/06 0556) Weight:  [71.7 kg] 71.7 kg (02/05 0733) Last BM Date : 11/14/23  Intake/Output from previous day: 02/05 0701 - 02/06 0700 In: 3471.6 [P.O.:627; I.V.:2844.6] Out: 1005 [Urine:900; Emesis/NG output:90; Blood:15] Intake/Output this shift: No intake/output data recorded.  Gen: NAD, comfortable CV: RRR Pulm: Normal work of breathing Abd: Soft, appropriate soreness, nondistended; incisions c/d/I without erythema/drainage Ext: SCDs in place  Lab Results: CBC  Recent Labs    11/15/23 0527  WBC 7.3  HGB 11.5*  HCT 34.2*  PLT 205   BMET Recent Labs    11/15/23 0527  NA 136  K 2.9*  CL 101  CO2 23  GLUCOSE 99  BUN 16  CREATININE 0.61  CALCIUM  8.8*   PT/INR No results for input(s): LABPROT, INR in the last 72 hours. ABG No results for input(s): PHART, HCO3 in the last 72 hours.  Invalid input(s): PCO2, PO2  Studies/Results:  Anti-infectives: Anti-infectives (From admission, onward)    Start     Dose/Rate Route Frequency Ordered Stop   11/14/23 1400  neomycin  (MYCIFRADIN ) tablet 1,000 mg  Status:  Discontinued       Placed in And Linked Group   1,000 mg Oral 3 times per day 11/14/23 0635 11/14/23 1235   11/14/23 1400  metroNIDAZOLE  (FLAGYL ) tablet 1,000 mg  Status:  Discontinued       Placed in And Linked Group   1,000 mg Oral 3 times per day 11/14/23 9364 11/14/23 1235   11/14/23 0645  cefoTEtan  (CEFOTAN ) 2 g in sodium chloride  0.9 % 100 mL IVPB        2 g 200 mL/hr over 30 Minutes Intravenous On call to O.R. 11/14/23 0635 11/14/23 0917         Assessment/Plan: Patient Active Problem List   Diagnosis Date Noted   S/P laparoscopic-assisted sigmoidectomy 11/14/2023   Sigmoid diverticulitis 08/06/2023   Dysuria 07/28/2023   Heart murmur 07/28/2023   Breast lump 06/14/2022   Uterine leiomyoma 06/14/2022   Bilateral carpal tunnel syndrome 08/24/2021   Controlled type 2 diabetes mellitus (HCC) 10/19/2017   Chronic myringitis, left ear 07/12/2015   Routine health maintenance 07/24/2012   Hyperlipidemia associated with type 2 diabetes mellitus (HCC) 07/23/2012   GERD 09/05/2010   Hypothyroidism 09/04/2007   Essential hypertension 09/04/2007   s/p Procedure(s): XI ROBOTIC ASSISTED LOWER ANTERIOR RESECTION WITH COLORECTAL ANASTOMOSIS, ICG PERFUSION ASSESSMENT, BILATERAL TAP BLOCK FLEXIBLE SIGMOIDOSCOPY, 11/14/2023  -Doing well -Advance to carb modified diet; restart po hypoglycemics in coming day if tolerating diet -Hypokalemia - replacing today - 40mEq IV, 20 mEq PO -D/C IVF, D/C Foley -Ambulate 5x/day as able -PT to see -We spent time reviewing procedure, findings and plans -PPX: SQH, SCDs   LOS: 1 day   Lonni Pizza, MD [facility], [facility]

## 2023-11-15 NOTE — Evaluation (Signed)
 Physical Therapy One Time Evaluation Patient Details Name: Sonya Brady MRN: [id number] DOB: December 24, 1950 Today's Date: 11/15/2023  History of Present Illness  Pt is a 73 y/o F admitted on 11/14/23 for scheduled robotic assisted lower anterior resection with colorectal anastomosis, ICG perfusion assessment, bilateral tap block, and flexible sigmoidoscopy. PMH: DM, HTN, HLD, GERD, hypothyroidism, CTS L wrist, depression, glaucoma, heart murmur, COVID-19, insomnia  Clinical Impression  Patient evaluated by Physical Therapy with no further acute PT needs identified. All education has been completed and the patient has no further questions. Pt very pleasant and motivated to ambulate.  Pt able to use bathroom independently prior to walking in hallway.  Pt mobilizing well and will have family support upon d/c. Pt encouraged to continue ambulating for remainder of hospitalization (with family and/or staff).  No anticipated follow-up Physical Therapy or equipment needs. PT is signing off. Thank you for this referral.         If plan is discharge home, recommend the following: Assistance with cooking/housework;Help with stairs or ramp for entrance;Assist for transportation   Can travel by private vehicle        Equipment Recommendations None recommended by PT  Recommendations for Other Services       Functional Status Assessment Patient has not had a recent decline in their functional status     Precautions / Restrictions Precautions Precautions: None Restrictions Weight Bearing Restrictions Per Provider Order: No      Mobility  Bed Mobility               General bed mobility comments: verbal reviewed log roll technique, pt in recliner on arrival    Transfers Overall transfer level: Modified independent Equipment used: None                    Ambulation/Gait Ambulation/Gait assistance: Supervision Gait Distance (Feet): 400 Feet Assistive device: None Gait  Pattern/deviations: Step-through pattern, Decreased stride length       General Gait Details: slow but steady paced, pt braced her abdomen with her hands for additional support/pain control  Stairs            Wheelchair Mobility     Tilt Bed    Modified Rankin (Stroke Patients Only)       Balance Overall balance assessment: No apparent balance deficits (not formally assessed)                                           Pertinent Vitals/Pain Pain Assessment Pain Assessment: Faces Faces Pain Scale: Hurts little more Pain Location: Incisional Pain Descriptors / Indicators: Tender, Sore Pain Intervention(s): Repositioned, Monitored during session, Patient requesting pain meds-RN notified (requesting tylenol )    Home Living Family/patient expects to be discharged to:: Private residence Living Arrangements: Children Available Help at Discharge: Family;Available 24 hours/day Type of Home: House Home Access: Stairs to enter Entrance Stairs-Rails: Doctor, General Practice of Steps: 4-5   Home Layout: One level Home Equipment: None      Prior Function Prior Level of Function : Independent/Modified Independent;Driving             Mobility Comments: independent ADLs Comments: Ind with all aspects of ADL,iADL and mobility.  Continues to drive.     Extremity/Trunk Assessment   Upper Extremity Assessment Upper Extremity Assessment: Overall WFL for tasks assessed    Lower Extremity Assessment Lower Extremity  Assessment: Overall WFL for tasks assessed    Cervical / Trunk Assessment Cervical / Trunk Assessment: Normal  Communication   Communication Communication: No apparent difficulties  Cognition Arousal: Alert Behavior During Therapy: WFL for tasks assessed/performed Overall Cognitive Status: Within Functional Limits for tasks assessed                                          General Comments       Exercises     Assessment/Plan    PT Assessment Patient does not need any further PT services  PT Problem List         PT Treatment Interventions      PT Goals (Current goals can be found in the Care Plan section)  Acute Rehab PT Goals PT Goal Formulation: All assessment and education complete, DC therapy    Frequency       Co-evaluation               AM-PAC PT 6 Clicks Mobility  Outcome Measure Help needed turning from your back to your side while in a flat bed without using bedrails?: None Help needed moving from lying on your back to sitting on the side of a flat bed without using bedrails?: None Help needed moving to and from a bed to a chair (including a wheelchair)?: None Help needed standing up from a chair using your arms (e.g., wheelchair or bedside chair)?: None Help needed to walk in hospital room?: A Little Help needed climbing 3-5 steps with a railing? : A Little 6 Click Score: 22    End of Session   Activity Tolerance: Patient tolerated treatment well Patient left: in chair;with call bell/phone within reach;with family/visitor present Nurse Communication: Mobility status PT Visit Diagnosis: Difficulty in walking, not elsewhere classified (R26.2)    Time: 8875-8862 PT Time Calculation (min) (ACUTE ONLY): 13 min   Charges:   PT Evaluation $PT Eval Low Complexity: 1 Low   PT General Charges $$ ACUTE PT VISIT: 1 Visit        Tari PT, DPT Physical Therapist Acute Rehabilitation Services Office: [phone]   Tari CROME Payson 11/15/2023, 12:23 PM

## 2023-11-15 NOTE — Plan of Care (Signed)
  Problem: Clinical Measurements: Goal: Postoperative complications will be avoided or minimized Outcome: Progressing   Problem: Activity: Goal: Risk for activity intolerance will decrease Outcome: Progressing   Problem: Nutrition: Goal: Adequate nutrition will be maintained Outcome: Progressing

## 2023-11-15 NOTE — Plan of Care (Signed)
  Problem: Education: Goal: Understanding of discharge needs will improve Outcome: Progressing Goal: Verbalization of understanding of the causes of altered bowel function will improve Outcome: Progressing   Problem: Activity: Goal: Ability to tolerate increased activity will improve Outcome: Progressing

## 2023-11-15 NOTE — Evaluation (Signed)
 Occupational Therapy Evaluation Patient Details Name: Sonya Brady MRN: [id number] DOB: 1951/02/06 Today's Date: 11/15/2023   History of Present Illness 73 y.o. female with history of DM, HTN, HLD, GERD, hypothyroidism,, whom is seen in the office today for evaluation of recurrent diverticulitis.  Underwent Robotic assisted low anterior resection with double stapled colorectal anastomosis 2/5.   Clinical Impression   Patient admitted for the diagnosis above.  PTA she lives at home with family and remained very independent with all aspects of ADL,iADL and mobility.  Patient is very close to her baseline, pain is controlled and no acute OT needs exist.  Recommend up and walking the halls to tolerance with staff.  Patient left up in recliner with all needs in reach.  Son is in the room.         If plan is discharge home, recommend the following: Assist for transportation    Functional Status Assessment  Patient has not had a recent decline in their functional status  Equipment Recommendations  None recommended by OT    Recommendations for Other Services       Precautions / Restrictions Precautions Precautions: None Restrictions Weight Bearing Restrictions Per Provider Order: No      Mobility Bed Mobility Overal bed mobility: Modified Independent                  Transfers Overall transfer level: Modified independent Equipment used: None                      Balance Overall balance assessment: No apparent balance deficits (not formally assessed)                                         ADL either performed or assessed with clinical judgement   ADL Overall ADL's : At baseline                                       General ADL Comments: Generalized supervision due to IV pole     Vision Patient Visual Report: No change from baseline       Perception Perception: Not tested       Praxis Praxis: Not tested        Pertinent Vitals/Pain Pain Assessment Pain Assessment: Faces Faces Pain Scale: Hurts little more Pain Location: Incisional Pain Descriptors / Indicators: Tender Pain Intervention(s): Monitored during session     Extremity/Trunk Assessment Upper Extremity Assessment Upper Extremity Assessment: Overall WFL for tasks assessed   Lower Extremity Assessment Lower Extremity Assessment: Defer to PT evaluation   Cervical / Trunk Assessment Cervical / Trunk Assessment: Normal   Communication Communication Communication: No apparent difficulties   Cognition Arousal: Alert Behavior During Therapy: WFL for tasks assessed/performed Overall Cognitive Status: Within Functional Limits for tasks assessed                                       General Comments   VSS    Exercises     Shoulder Instructions      Home Living Family/patient expects to be discharged to:: Private residence Living Arrangements: Children Available Help at Discharge: Family;Available 24 hours/day Type of Home: House Home Access: Stairs to  enter Entrance Stairs-Number of Steps: 4-5 Entrance Stairs-Rails: Right;Left Home Layout: One level     Bathroom Shower/Tub: Chief Strategy Officer: Standard Bathroom Accessibility: Yes How Accessible: Accessible via walker Home Equipment: None          Prior Functioning/Environment Prior Level of Function : Independent/Modified Independent;Driving               ADLs Comments: Ind with all aspects of ADL,iADL and mobility.  Continues to drive.        OT Problem List: Pain      OT Treatment/Interventions:      OT Goals(Current goals can be found in the care plan section) Acute Rehab OT Goals Patient Stated Goal: Return home OT Goal Formulation: With patient Time For Goal Achievement: 11/22/23 Potential to Achieve Goals: Good  OT Frequency:      Co-evaluation              AM-PAC OT 6 Clicks Daily Activity      Outcome Measure Help from another person eating meals?: None Help from another person taking care of personal grooming?: None Help from another person toileting, which includes using toliet, bedpan, or urinal?: None Help from another person bathing (including washing, rinsing, drying)?: None Help from another person to put on and taking off regular upper body clothing?: None Help from another person to put on and taking off regular lower body clothing?: None 6 Click Score: 24   End of Session Equipment Utilized During Treatment: Other (comment) (foley) Nurse Communication: Mobility status  Activity Tolerance: Patient tolerated treatment well Patient left: in chair;with call bell/phone within reach;with family/visitor present  OT Visit Diagnosis: Pain                Time: 8979-8957 OT Time Calculation (min): 22 min Charges:  OT General Charges $OT Visit: 1 Visit OT Evaluation $OT Eval Moderate Complexity: 1 Mod  11/15/2023  RP, OTR/L  Acute Rehabilitation Services  Office:  [phone]   Charlie JONETTA Halsted 11/15/2023, 10:47 AM

## 2023-11-16 LAB — CBC
HCT: 33.9 % — ABNORMAL LOW (ref 36.0–46.0)
Hemoglobin: 11.2 g/dL — ABNORMAL LOW (ref 12.0–15.0)
MCH: 28.9 pg (ref 26.0–34.0)
MCHC: 33 g/dL (ref 30.0–36.0)
MCV: 87.6 fL (ref 80.0–100.0)
Platelets: 186 10*3/uL (ref 150–400)
RBC: 3.87 MIL/uL (ref 3.87–5.11)
RDW: 13.2 % (ref 11.5–15.5)
WBC: 5.2 10*3/uL (ref 4.0–10.5)
nRBC: 0 % (ref 0.0–0.2)

## 2023-11-16 LAB — BASIC METABOLIC PANEL
Anion gap: 8 (ref 5–15)
BUN: 17 mg/dL (ref 8–23)
CO2: 24 mmol/L (ref 22–32)
Calcium: 8.9 mg/dL (ref 8.9–10.3)
Chloride: 105 mmol/L (ref 98–111)
Creatinine, Ser: 0.61 mg/dL (ref 0.44–1.00)
GFR, Estimated: >60 mL/min (ref >60)
Glucose, Bld: 101 mg/dL — ABNORMAL HIGH (ref 70–99)
Potassium: 3.2 mmol/L — ABNORMAL LOW (ref 3.5–5.1)
Sodium: 137 mmol/L (ref 135–145)

## 2023-11-16 LAB — GLUCOSE, CAPILLARY
Glucose-Capillary: 103 mg/dL — ABNORMAL HIGH (ref 70–99)
Glucose-Capillary: 190 mg/dL — ABNORMAL HIGH (ref 70–99)
Glucose-Capillary: 107 mg/dL — ABNORMAL HIGH (ref 70–99)
Glucose-Capillary: 156 mg/dL — ABNORMAL HIGH (ref 70–99)

## 2023-11-16 MED ORDER — DOCUSATE SODIUM 100 MG PO CAPS
200.0000 mg | ORAL_CAPSULE | Freq: Two times a day (BID) | ORAL | Status: DC
  Administered 2023-11-16 – 2023-11-17 (×2): 200 mg via ORAL
  Filled 2023-11-16 (×2): qty 2

## 2023-11-16 MED ORDER — POTASSIUM CHLORIDE CRYS ER 20 MEQ PO TBCR
40.0000 meq | EXTENDED_RELEASE_TABLET | Freq: Once | ORAL | Status: AC
  Administered 2023-11-16: 40 meq via ORAL
  Filled 2023-11-16: qty 2

## 2023-11-16 NOTE — Progress Notes (Addendum)
  Subjective No acute events. Soreness last night and only taking tylenol  so had some difficulty sleeping. Tolerating diet without n/v. Ambulating. Voiding fine without difficulty. Having flatus and BMs.   Objective: Vital signs in last 24 hours: Temp:  [97.6 F (36.4 C)-99.5 F (37.5 C)] 98.1 F (36.7 C) (02/07 0624) Pulse Rate:  [65-78] 73 (02/07 0624) Resp:  [15-18] 15 (02/07 0624) BP: (109-125)/(62-98) 117/62 (02/07 0624) SpO2:  [95 %-99 %] 95 % (02/07 0624) Last BM Date : 11/15/23  Intake/Output from previous day: 02/06 0701 - 02/07 0700 In: 1380 [P.O.:1080; IV Piggyback:300] Out: 3000 [Urine:3000] Intake/Output this shift: No intake/output data recorded.  Gen: NAD, comfortable CV: RRR Pulm: Normal work of breathing Abd: Soft, not significantly tender; non distended. Incisions c/d/I without erythema/drainage Ext: SCDs in place  Lab Results: CBC  Recent Labs    11/15/23 0527 11/16/23 0454  WBC 7.3 5.2  HGB 11.5* 11.2*  HCT 34.2* 33.9*  PLT 205 186   BMET Recent Labs    11/15/23 0527 11/16/23 0454  NA 136 137  K 2.9* 3.2*  CL 101 105  CO2 23 24  GLUCOSE 99 101*  BUN 16 17  CREATININE 0.61 0.61  CALCIUM  8.8* 8.9   PT/INR No results for input(s): LABPROT, INR in the last 72 hours. ABG No results for input(s): PHART, HCO3 in the last 72 hours.  Invalid input(s): PCO2, PO2  Studies/Results:  Anti-infectives: Anti-infectives (From admission, onward)    Start     Dose/Rate Route Frequency Ordered Stop   11/14/23 1400  neomycin  (MYCIFRADIN ) tablet 1,000 mg  Status:  Discontinued       Placed in And Linked Group   1,000 mg Oral 3 times per day 11/14/23 0635 11/14/23 1235   11/14/23 1400  metroNIDAZOLE  (FLAGYL ) tablet 1,000 mg  Status:  Discontinued       Placed in And Linked Group   1,000 mg Oral 3 times per day 11/14/23 9364 11/14/23 1235   11/14/23 0645  cefoTEtan  (CEFOTAN ) 2 g in sodium chloride  0.9 % 100 mL IVPB        2 g 200  mL/hr over 30 Minutes Intravenous On call to O.R. 11/14/23 0635 11/14/23 0917        Assessment/Plan: Patient Active Problem List   Diagnosis Date Noted   S/P laparoscopic-assisted sigmoidectomy 11/14/2023   Sigmoid diverticulitis 08/06/2023   Dysuria 07/28/2023   Heart murmur 07/28/2023   Breast lump 06/14/2022   Uterine leiomyoma 06/14/2022   Bilateral carpal tunnel syndrome 08/24/2021   Controlled type 2 diabetes mellitus (HCC) 10/19/2017   Chronic myringitis, left ear 07/12/2015   Routine health maintenance 07/24/2012   Hyperlipidemia associated with type 2 diabetes mellitus (HCC) 07/23/2012   GERD 09/05/2010   Hypothyroidism 09/04/2007   Essential hypertension 09/04/2007   s/p Procedure(s): XI ROBOTIC ASSISTED LOWER ANTERIOR RESECTION WITH COLORECTAL ANASTOMOSIS, ICG PERFUSION ASSESSMENT, BILATERAL TAP BLOCK FLEXIBLE SIGMOIDOSCOPY, 11/14/2023  - Doing well - Discussed taking tramadol  if incisional pain is not controlled with tylenol  and ibuprofen   -Ambulate 5x/day -Reviewed pathology with  her today -  -  Diverticulosis with pulse granulomas consistent with previous  rupture.  -Hypokalemia - improving but will give addnl kcl today - 2 unremarkable lymph nodes.   -PPX: SQH, SCDs  -Possible discharge home tomorrow if doing well   LOS: 2 days   Lonni Pizza, MD [facility], [facility]

## 2023-11-16 NOTE — TOC Initial Note (Signed)
 Transition of Care [facility]) - Initial/Assessment Note    Patient Details  Name: Sonya Brady MRN: [id number] Date of Birth: Feb 19, 1951  Transition of [facility]) CM/SW Contact:    Sonda Manuella Quill, RN Phone Number: 11/16/2023, 12:20 PM  Clinical Narrative:                 [place] w/ pt and son Braleigh Massoud in room; pt says she lives at home w/ her daughter; she plans to return at d/c; she verified PCP/insurance; pt denied SDOH risks; pt says she does not have DME, HH services, or home oxygen; no TOC needs; TOC signing off; please place consult if if needed.  Expected Discharge Plan: Home/Self Care Barriers to Discharge: Continued Medical Work up   Patient Goals and CMS Choice Patient states their goals for this hospitalization and ongoing recovery are:: home CMS Medicare.gov Compare Post Acute Care list provided to:: Patient        Expected Discharge Plan and Services   Discharge Planning Services: CM Consult   Living arrangements for the past 2 months: Single Family Home                                      Prior Living Arrangements/Services Living arrangements for the past 2 months: Single Family Home Lives with:: Adult Children Patient language and need for interpreter reviewed:: Yes Do you feel safe going back to the place where you live?: Yes      Need for Family Participation in Patient Care: Yes (Comment) Care giver support system in place?: Yes (comment) Current home services:  (n/a) Criminal Activity/Legal Involvement Pertinent to Current Situation/Hospitalization: No - Comment as needed  Activities of Daily Living   ADL Screening (condition at time of admission) Independently performs ADLs?: Yes (appropriate for developmental age) Is the patient deaf or have difficulty hearing?: No Does the patient have difficulty seeing, even when wearing glasses/contacts?: No Does the patient have difficulty concentrating, remembering, or making decisions?:  No  Permission Sought/Granted Permission sought to share information with : Case Manager Permission granted to share information with : Yes, Verbal Permission Granted  Share Information with NAME: Case Manager     Permission granted to share info w Relationship: Sonya Brady (son) [phone]     Emotional Assessment Appearance:: Appears stated age Attitude/Demeanor/Rapport: Gracious Affect (typically observed): Accepting Orientation: : Oriented to Self, Oriented to Place, Oriented to  Time, Oriented to Situation Alcohol / Substance Use: Not Applicable Psych Involvement: No (comment)  Admission diagnosis:  S/P laparoscopic-assisted sigmoidectomy [Z90.49] Patient Active Problem List   Diagnosis Date Noted   S/P laparoscopic-assisted sigmoidectomy 11/14/2023   Sigmoid diverticulitis 08/06/2023   Dysuria 07/28/2023   Heart murmur 07/28/2023   Breast lump 06/14/2022   Uterine leiomyoma 06/14/2022   Bilateral carpal tunnel syndrome 08/24/2021   Controlled type 2 diabetes mellitus (HCC) 10/19/2017   Chronic myringitis, left ear 07/12/2015   Routine health maintenance 07/24/2012   Hyperlipidemia associated with type 2 diabetes mellitus (HCC) 07/23/2012   GERD 09/05/2010   Hypothyroidism 09/04/2007   Essential hypertension 09/04/2007   PCP:  Rollene Almarie LABOR, MD Pharmacy:   [facility] [address] [place] - [address] [place] [address] Phone: [phone] Fax: [phone]  [facility] [address], [place] - [address] [place] [address] Phone: [phone] Fax: [phone]  [facility] [phone] -  [place], [place] - [address] AT [facility] [address] [place] [address] Phone: [phone] Fax: [phone]  [facility] [address] [place] [address] Phone: [phone] Fax:  [phone]     Social Drivers of Health (SDOH) Social History: SDOH Screenings   Food Insecurity: No Food Insecurity (11/16/2023)  Housing: Low Risk  (11/16/2023)  Transportation Needs: No Transportation Needs (11/16/2023)  Utilities: Not At Risk (11/16/2023)  Alcohol Screen: Low Risk  (02/12/2023)  Depression (PHQ2-9): Low Risk  (07/25/2023)  Financial Resource Strain: Low Risk  (02/12/2023)  Physical Activity: Unknown (02/12/2023)  Social Connections: Moderately Integrated (11/14/2023)  Stress: No Stress Concern Present (02/12/2023)  Tobacco Use: Low Risk  (11/14/2023)   SDOH Interventions: Food Insecurity Interventions: Intervention Not Indicated, Inpatient TOC Housing Interventions: Intervention Not Indicated, Inpatient TOC Transportation Interventions: Intervention Not Indicated, Inpatient TOC Utilities Interventions: Intervention Not Indicated, Inpatient TOC   Readmission Risk Interventions     No data to display

## 2023-11-16 NOTE — Plan of Care (Signed)
  Problem: Education: Goal: Understanding of discharge needs will improve Outcome: Progressing Goal: Verbalization of understanding of the causes of altered bowel function will improve Outcome: Progressing

## 2023-11-16 NOTE — Progress Notes (Signed)
 Paged MD "pt requesting miralax  or any preventative measures for her to not become constipated"

## 2023-11-17 ENCOUNTER — Other Ambulatory Visit (HOSPITAL_COMMUNITY): Payer: Self-pay

## 2023-11-17 LAB — BASIC METABOLIC PANEL
Anion gap: 8 (ref 5–15)
BUN: 11 mg/dL (ref 8–23)
CO2: 25 mmol/L (ref 22–32)
Calcium: 9 mg/dL (ref 8.9–10.3)
Chloride: 103 mmol/L (ref 98–111)
Creatinine, Ser: 0.5 mg/dL (ref 0.44–1.00)
GFR, Estimated: >60 mL/min (ref >60)
Glucose, Bld: 123 mg/dL — ABNORMAL HIGH (ref 70–99)
Potassium: 3.7 mmol/L (ref 3.5–5.1)
Sodium: 136 mmol/L (ref 135–145)

## 2023-11-17 LAB — CBC
HCT: 33.9 % — ABNORMAL LOW (ref 36.0–46.0)
Hemoglobin: 11.2 g/dL — ABNORMAL LOW (ref 12.0–15.0)
MCH: 29.2 pg (ref 26.0–34.0)
MCHC: 33 g/dL (ref 30.0–36.0)
MCV: 88.5 fL (ref 80.0–100.0)
Platelets: 194 10*3/uL (ref 150–400)
RBC: 3.83 MIL/uL — ABNORMAL LOW (ref 3.87–5.11)
RDW: 13.1 % (ref 11.5–15.5)
WBC: 6.1 10*3/uL (ref 4.0–10.5)
nRBC: 0 % (ref 0.0–0.2)

## 2023-11-17 LAB — GLUCOSE, CAPILLARY: Glucose-Capillary: 125 mg/dL — ABNORMAL HIGH (ref 70–99)

## 2023-11-17 NOTE — Discharge Summary (Signed)
 Patient ID: KENA LIMON MRN: [id number] DOB/AGE: [birth year] 73 y.o.  Admit date: 11/14/2023 Discharge date: 11/17/2023  Discharge Diagnoses Patient Active Problem List   Diagnosis Date Noted   S/P laparoscopic-assisted sigmoidectomy 11/14/2023   Sigmoid diverticulitis 08/06/2023   Dysuria 07/28/2023   Heart murmur 07/28/2023   Breast lump 06/14/2022   Uterine leiomyoma 06/14/2022   Bilateral carpal tunnel syndrome 08/24/2021   Controlled type 2 diabetes mellitus (HCC) 10/19/2017   Chronic myringitis, left ear 07/12/2015   Routine health maintenance 07/24/2012   Hyperlipidemia associated with type 2 diabetes mellitus (HCC) 07/23/2012   GERD 09/05/2010   Hypothyroidism 09/04/2007   Essential hypertension 09/04/2007    Consultants None  Procedures OR 11/14/23 Robotic assisted low anterior resection with double stapled colorectal anastomosis Intraoperative assessment of perfusion using ICG fluorescence imaging Flexible sigmoidoscopy Bilateral transversus abdominus plane (TAP) blocks  Hospital Course: She was postoperatively recovered well.  Her diet was gradually advanced which she tolerated.  On POD#3 she is doing well, tolerating diet, having bowel function, ambulating well on her own. She is comfortable with and stable for discharge home.  Postoperative expectations have been reviewed.  Follow-up has been arranged.    Allergies as of 11/17/2023       Reactions   Flagyl  [metronidazole ]    Redness on arms        Medication List     TAKE these medications    atorvastatin  80 MG tablet Commonly known as: LIPITOR Take 1 tablet (80 mg total) by mouth daily. What changed: when to take this   Biotin  5000 MCG Caps Take 5,000 mcg by mouth daily.   clindamycin 1 % external solution Commonly known as: CLEOCIN T 1 Application See admin instructions. Apply into the left ear every Sunday   dorzolamide -timolol  2-0.5 % ophthalmic solution Commonly known as: COSOPT  Place 1  drop into both eyes 2 (two) times daily.   latanoprost  0.005 % ophthalmic solution Commonly known as: XALATAN  Place 1 drop into both eyes at bedtime.   levothyroxine  112 MCG tablet Commonly known as: SYNTHROID  Take 1 tablet (112 mcg total) by mouth daily before breakfast.   lisinopril -hydrochlorothiazide  20-25 MG tablet Commonly known as: ZESTORETIC  Take 1 tablet by mouth daily.   Melatonin 10 MG Tabs Take 1 tablet by mouth at bedtime as needed.   metFORMIN  500 MG 24 hr tablet Commonly known as: GLUCOPHAGE -XR Take 3 tablets (1,500 mg total) by mouth daily with breakfast.   montelukast  10 MG tablet Commonly known as: SINGULAIR  Take 10 mg by mouth at bedtime.   omeprazole  20 MG capsule Commonly known as: PRILOSEC Take 1 capsule (20 mg total) by mouth daily.   OneTouch Delica Lancets 30G Misc USE 1  TO CHECK GLUCOSE ONCE DAILY   OneTouch Verio test strip Generic drug: glucose blood USE 1 STRIP TO CHECK GLUCOSE ONCE DAILY   OneTouch Verio w/Device Kit Use As Directed   polyethylene glycol 17 g packet Commonly known as: MIRALAX  / GLYCOLAX  Take 17 g by mouth daily. What changed:  when to take this reasons to take this   PreserVision AREDS 2 Caps Take 1 capsule by mouth 2 (two) times daily.   traMADol  50 MG tablet Commonly known as: Ultram  Take 1 tablet (50 mg total) by mouth every 6 (six) hours as needed for up to 5 days (postop pain not controlled with tylenol  and ibuprofen  first).   ZzzQuil 25 MG Caps Generic drug: diphenhydrAMINE  HCl (Sleep) Take 25 mg by mouth at bedtime as  needed (sleep).          Follow-up Information     Teresa Lonni HERO, MD Follow up on 12/04/2023.   Specialties: General Surgery, Colon and Rectal Surgery Why: Please arrive by 10:00 am Contact information: [address] [place] [address] [phone]                 Lonni HERO. Teresa, M.D. [facility]

## 2023-11-17 NOTE — Plan of Care (Signed)
   Problem: Education: Goal: Understanding of discharge needs will improve Outcome: Progressing   Problem: Activity: Goal: Ability to tolerate increased activity will improve Outcome: Progressing

## 2023-11-17 NOTE — Progress Notes (Signed)
  Subjective No acute events. Feeling Much better today. No n/v, up walking on her own without difficulty. Having flatus and BMs. Pain well controlled  Objective: Vital signs in last 24 hours: Temp:  [98.4 F (36.9 C)-99.2 F (37.3 C)] 99.2 F (37.3 C) (02/08 0523) Pulse Rate:  [77-87] 86 (02/08 0523) Resp:  [16-18] 18 (02/08 0523) BP: (109-127)/(62-67) 120/67 (02/08 0523) SpO2:  [95 %-99 %] 95 % (02/08 0523) Last BM Date : 11/15/23  Intake/Output from previous day: 02/07 0701 - 02/08 0700 In: 660 [P.O.:660] Out: 2100 [Urine:2100] Intake/Output this shift: No intake/output data recorded.  Gen: NAD, comfortable CV: RRR Pulm: Normal work of breathing Abd: Soft, not significantly tender; non distended. Incisions c/d/I without erythema/drainage Ext: SCDs in place  Lab Results: CBC  Recent Labs    11/16/23 0454 11/17/23 0538  WBC 5.2 6.1  HGB 11.2* 11.2*  HCT 33.9* 33.9*  PLT 186 194   BMET Recent Labs    11/16/23 0454 11/17/23 0538  NA 137 136  K 3.2* 3.7  CL 105 103  CO2 24 25  GLUCOSE 101* 123*  BUN 17 11  CREATININE 0.61 0.50  CALCIUM  8.9 9.0   PT/INR No results for input(s): LABPROT, INR in the last 72 hours. ABG No results for input(s): PHART, HCO3 in the last 72 hours.  Invalid input(s): PCO2, PO2  Studies/Results:  Anti-infectives: Anti-infectives (From admission, onward)    Start     Dose/Rate Route Frequency Ordered Stop   11/14/23 1400  neomycin  (MYCIFRADIN ) tablet 1,000 mg  Status:  Discontinued       Placed in And Linked Group   1,000 mg Oral 3 times per day 11/14/23 0635 11/14/23 1235   11/14/23 1400  metroNIDAZOLE  (FLAGYL ) tablet 1,000 mg  Status:  Discontinued       Placed in And Linked Group   1,000 mg Oral 3 times per day 11/14/23 9364 11/14/23 1235   11/14/23 0645  cefoTEtan  (CEFOTAN ) 2 g in sodium chloride  0.9 % 100 mL IVPB        2 g 200 mL/hr over 30 Minutes Intravenous On call to O.R. 11/14/23 0635 11/14/23  0917        Assessment/Plan: Patient Active Problem List   Diagnosis Date Noted   S/P laparoscopic-assisted sigmoidectomy 11/14/2023   Sigmoid diverticulitis 08/06/2023   Dysuria 07/28/2023   Heart murmur 07/28/2023   Breast lump 06/14/2022   Uterine leiomyoma 06/14/2022   Bilateral carpal tunnel syndrome 08/24/2021   Controlled type 2 diabetes mellitus (HCC) 10/19/2017   Chronic myringitis, left ear 07/12/2015   Routine health maintenance 07/24/2012   Hyperlipidemia associated with type 2 diabetes mellitus (HCC) 07/23/2012   GERD 09/05/2010   Hypothyroidism 09/04/2007   Essential hypertension 09/04/2007   s/p Procedure(s): XI ROBOTIC ASSISTED LOWER ANTERIOR RESECTION WITH COLORECTAL ANASTOMOSIS, ICG PERFUSION ASSESSMENT, BILATERAL TAP BLOCK FLEXIBLE SIGMOIDOSCOPY, 11/14/2023  - Doing well; pain better with tylenol  +  ibuprofen  -Ambulate 5x/day -Hypokalemia - resolved  -PPX: SQH, SCDs  - She is comfortable with and stable for discharge home. Postop expectations and plans reviewed. All questions answered.   LOS: 3 days   Lonni Pizza, MD [facility]

## 2023-11-17 NOTE — Progress Notes (Signed)
 PIV x 2 removed from L FA & R FA - catheter tips intact. Pt dressed for d/c - son enroute to hospital.

## 2023-11-17 NOTE — Progress Notes (Signed)
 TOC discharge meds delivered in a secure bag to pt in room by this RN. Pt dressing for d/c to home. Eating breakfast- will d/c PIV x 2 once pt has finished eating. AVS reviewed w/ pt who verbalized an understanding. No other questions at this time.

## 2023-11-19 ENCOUNTER — Telehealth: Payer: Self-pay | Admitting: *Deleted

## 2023-11-19 NOTE — Transitions of Care (Post Inpatient/ED Visit) (Signed)
 11/19/2023  Name: Sonya Brady MRN: [id number] DOB: [birth year]  Today's TOC FU Call Status: Today's TOC FU Call Status:: Successful TOC FU Call Completed TOC FU Call Complete Date: 11/19/23 Patient's Name and Date of Birth confirmed.  Transition Care Management Follow-up Telephone Call Date of Discharge: 11/17/23 Discharge Facility: [facility]) Type of Discharge: Inpatient Admission Primary Inpatient Discharge Diagnosis:: Surgical laparoscopic assisted sigmoidectomy How have you been since you were released from the hospital?: Better ("I am fine- much better.  Not having any problems.  Taking care of myself, but my son is a doctor at [employer]- he is a hospitalist- so he is always checking on me, and I live with my daughter.  I am doing fine") Any questions or concerns?: No  Items Reviewed: Did you receive and understand the discharge instructions provided?: Yes (thoroughly reviewed with patient who verbalizes good understanding of same) Medications obtained,verified, and reconciled?: Yes (Medications Reviewed) (Full medication reconciliation/ review completed; no concerns or discrepancies identified; confirmed patient obtained/ is taking all newly Rx'd medications as instructed; self-manages medications and denies questions/ concerns around medications today) Any new allergies since your discharge?: No Dietary orders reviewed?: Yes Type of Diet Ordered:: Diabetic diet as "much as possible" Do you have support at home?: Yes People in Home: child(ren), adult Name of Support/Comfort Primary Source: Reports independent in self-care activities; spouse assists as/ if needed/ indicated  Medications Reviewed Today: Medications Reviewed Today     Reviewed by Nelle Sayed M, RN (Registered Nurse) on 11/19/23 at 1400  Med List Status: <None>   Medication Order Taking? Sig Documenting Provider Last Dose Status Informant  atorvastatin  (LIPITOR) 80 MG tablet [id number]  Take 1 tablet (80 mg  total) by mouth daily.  Patient taking differently: Take 80 mg by mouth every evening.   Adelia Homestead, MD  Active Self  Biotin  5000 MCG CAPS [id number]  Take 5,000 mcg by mouth daily. [provider]  Active Self  Blood Glucose Monitoring Suppl [facility] VERIO) w/Device Suzanne Erps [id number]  Use As Directed Adelia Homestead, MD  Active Self  clindamycin (CLEOCIN T) 1 % external solution [id number]  1 Application See admin instructions. Apply into the left ear every Sunday [provider]  Active Self  diphenhydrAMINE  HCl, Sleep, (ZZZQUIL) 25 MG CAPS [id number]  Take 25 mg by mouth at bedtime as needed (sleep). [provider]  Active Self  dorzolamide -timolol  (COSOPT ) 2-0.5 % ophthalmic solution [id number]  Place 1 drop into both eyes 2 (two) times daily. [provider]  Active Self  glucose blood (ONETOUCH VERIO) test strip [id number]  USE 1 STRIP TO CHECK GLUCOSE ONCE DAILY Adelia Homestead, MD  Active Self  latanoprost  (XALATAN ) 0.005 % ophthalmic solution [id number]  Place 1 drop into both eyes at bedtime. [provider]  Active Self  levothyroxine  (SYNTHROID ) 112 MCG tablet [id number]  Take 1 tablet (112 mcg total) by mouth daily before breakfast. Adelia Homestead, MD  Active Self  lisinopril -hydrochlorothiazide  (ZESTORETIC ) 20-25 MG tablet [id number]  Take 1 tablet by mouth daily. Adelia Homestead, MD  Active Self  Melatonin 10 MG TABS [id number]  Take 1 tablet by mouth at bedtime as needed. [provider]  Active Self  metFORMIN  (GLUCOPHAGE -XR) 500 MG 24 hr tablet [id number]  Take 3 tablets (1,500 mg total) by mouth daily with breakfast. Adelia Homestead, MD  Active Self  montelukast  (SINGULAIR ) 10 MG tablet [id number]  Take 10 mg by mouth at bedtime. [provider]  Active Self  Multiple Vitamins-Minerals (PRESERVISION AREDS 2) CAPS [id number]  Take 1 capsule by mouth 2 (two) times daily. [provider]   Active Self  omeprazole  (PRILOSEC) 20 MG capsule [id number]  Take 1 capsule (20 mg total) by mouth daily. Tobin Forts, MD  Active Self  OneTouch Delica Lancets 30G MISC [id number]  USE 1  TO CHECK GLUCOSE ONCE DAILY Lajean Pike, MD  Active Self  polyethylene glycol (MIRALAX  / GLYCOLAX ) 17 g packet [id number]  Take 17 g by mouth daily.  Patient taking differently: Take 17 g by mouth daily as needed for moderate constipation.   Vada Garibaldi, MD  Active Self  traMADol  (ULTRAM ) 50 MG tablet [id number]  Take 1 tablet (50 mg total) by mouth every 6 (six) hours as needed for up to 5 days (postop pain not controlled with tylenol  and ibuprofen  first). Melvenia Stabs, MD  Active            Home Care and Equipment/Supplies: Were Home Health Services Ordered?: No Any new equipment or medical supplies ordered?: No  Functional Questionnaire: Do you need assistance with bathing/showering or dressing?: No Do you need assistance with meal preparation?: No Do you need assistance with eating?: No Do you have difficulty maintaining continence: No Do you need assistance with getting out of bed/getting out of a chair/moving?: No Do you have difficulty managing or taking your medications?: No  Follow up appointments reviewed: PCP Follow-up appointment confirmed?: NA (verified not indicated per hospital discharging provider discharge notes) Specialist Hospital Follow-up appointment confirmed?: Yes Date of Specialist follow-up appointment?: 12/04/23 Follow-Up Specialty Provider:: Surgical providers Do you need transportation to your follow-up appointment?: No Do you understand care options if your condition(s) worsen?: Yes-patient verbalized understanding  SDOH Interventions Today    Flowsheet Row Most Recent Value  SDOH Interventions   Food Insecurity Interventions Intervention Not Indicated  Housing Interventions Intervention Not Indicated  Transportation Interventions Intervention Not  Indicated  [reports drives self]  Utilities Interventions Intervention Not Indicated      Interventions Today    Flowsheet Row Most Recent Value  Chronic Disease   Chronic disease during today's visit Other  [surgical lap sigmoidectomy]  General Interventions   General Interventions Discussed/Reviewed General Interventions Discussed, Durable Medical Equipment (DME), Doctor Visits  Doctor Visits Discussed/Reviewed Specialist, Doctor Visits Discussed  Durable Medical Equipment (DME) Other  [confirmed not currently requiring/ using assistive devices for ambulation]  PCP/Specialist Visits Compliance with follow-up visit  Exercise Interventions   Exercise Discussed/Reviewed Exercise Discussed  [benefit of conservative post-op activity,  need to pace activity without over-doing- confirmed patient is ambulating frequently around home as instructed at time of hospital discharge]  Education Interventions   Education Provided Provided Education  Provided Verbal Education On Other, [employer], When to see the doctor  [benefit of conservative post-op activity,  need to pace activity without over-doing- confirmed patient is ambulating frequently around home as instructed at time of hospital discharge]  Nutrition Interventions   Nutrition Discussed/Reviewed Nutrition Discussed  Pharmacy Interventions   Pharmacy Dicussed/Reviewed Pharmacy Topics Discussed  [Full medication review with updating medication list in EHR per patient report]  Safety Interventions   Safety Discussed/Reviewed Safety Discussed      TOC Interventions Today    Flowsheet Row Most Recent Value  TOC Interventions   TOC Interventions Discussed/Reviewed TOC Interventions Discussed  [Patient declines need for ongoing/ further care management/ coordination outreach,  declines enrollment in 30-day TOC program- declines taking my direct phone number  should needs/ concerns arise post-TOC call]      Total time spent from review  to signing of note/ including any care coordination interventions: 42 minutes  Merida Alcantar Mckinney Arlyne Brandes, RN, BSN, Media planner  Transitions of Care  [facility] - [facility] [phone]: direct office

## 2023-12-12 DIAGNOSIS — H6522 Chronic serous otitis media, left ear: Secondary | ICD-10-CM | POA: Diagnosis not present

## 2023-12-12 DIAGNOSIS — H7312 Chronic myringitis, left ear: Secondary | ICD-10-CM | POA: Diagnosis not present

## 2024-01-10 ENCOUNTER — Other Ambulatory Visit: Payer: Self-pay | Admitting: Internal Medicine

## 2024-02-15 ENCOUNTER — Ambulatory Visit

## 2024-02-25 ENCOUNTER — Ambulatory Visit

## 2024-02-25 ENCOUNTER — Ambulatory Visit: Payer: Self-pay

## 2024-02-25 ENCOUNTER — Ambulatory Visit: Admitting: Emergency Medicine

## 2024-02-25 ENCOUNTER — Encounter: Payer: Self-pay | Admitting: Emergency Medicine

## 2024-02-25 VITALS — BP 148/74 | HR 121 | Temp 102.7°F | Ht 65.0 in | Wt 154.0 lb

## 2024-02-25 DIAGNOSIS — R0989 Other specified symptoms and signs involving the circulatory and respiratory systems: Secondary | ICD-10-CM | POA: Diagnosis not present

## 2024-02-25 DIAGNOSIS — E039 Hypothyroidism, unspecified: Secondary | ICD-10-CM | POA: Diagnosis not present

## 2024-02-25 DIAGNOSIS — E785 Hyperlipidemia, unspecified: Secondary | ICD-10-CM | POA: Diagnosis not present

## 2024-02-25 DIAGNOSIS — R059 Cough, unspecified: Secondary | ICD-10-CM | POA: Diagnosis not present

## 2024-02-25 DIAGNOSIS — J189 Pneumonia, unspecified organism: Secondary | ICD-10-CM | POA: Diagnosis not present

## 2024-02-25 DIAGNOSIS — Z7984 Long term (current) use of oral hypoglycemic drugs: Secondary | ICD-10-CM | POA: Diagnosis not present

## 2024-02-25 DIAGNOSIS — E1169 Type 2 diabetes mellitus with other specified complication: Secondary | ICD-10-CM

## 2024-02-25 DIAGNOSIS — I10 Essential (primary) hypertension: Secondary | ICD-10-CM

## 2024-02-25 DIAGNOSIS — R918 Other nonspecific abnormal finding of lung field: Secondary | ICD-10-CM | POA: Diagnosis not present

## 2024-02-25 LAB — POC COVID19 BINAXNOW: SARS Coronavirus 2 Ag: NEGATIVE

## 2024-02-25 LAB — POCT INFLUENZA A/B
Influenza A, POC: NEGATIVE
Influenza B, POC: NEGATIVE

## 2024-02-25 MED ORDER — CEFTRIAXONE SODIUM 1 G IJ SOLR
1.0000 g | Freq: Once | INTRAMUSCULAR | Status: AC
  Administered 2024-02-25: 1 g via INTRAMUSCULAR

## 2024-02-25 MED ORDER — AZITHROMYCIN 250 MG PO TABS: ORAL_TABLET | ORAL | 0 refills | Status: DC

## 2024-02-25 NOTE — Assessment & Plan Note (Signed)
 Clinically euthyroid Lab Results  Component Value Date   TSH 1.23 02/16/2023  Continue Synthroid  112 mcg daily

## 2024-02-25 NOTE — Progress Notes (Signed)
 Sonya Brady 73 y.o.   Chief Complaint  Patient presents with   Dizziness    Patient states she's been had dizziness, cough, no appetite, no taste, fatigue for about a week. she has not gotten tested for Covid or flu. She taking tylenol  and ibuprofen       HISTORY OF PRESENT ILLNESS: This is a 73 y.o. female complaining of fever, chills and dry cough that started about a week ago Has no appetite and feels fatigued.  Dizziness Associated symptoms include chills, congestion, coughing and a fever. Pertinent negatives include no abdominal pain, chest pain, nausea, sore throat or vomiting.     Prior to Admission medications   Medication Sig Start Date End Date Taking? Authorizing Provider  atorvastatin  (LIPITOR) 80 MG tablet Take 1 tablet (80 mg total) by mouth daily. Patient taking differently: Take 80 mg by mouth every evening. 04/17/23  Yes Adelia Homestead, MD  Biotin  5000 MCG CAPS Take 5,000 mcg by mouth daily.   Yes [provider]  Blood Glucose Monitoring Suppl (ONETOUCH VERIO) w/Device KIT Use As Directed 04/17/23  Yes Adelia Homestead, MD  clindamycin (CLEOCIN T) 1 % external solution 1 Application See admin instructions. Apply into the left ear every Sunday   Yes [provider]  diphenhydrAMINE  HCl, Sleep, (ZZZQUIL) 25 MG CAPS Take 25 mg by mouth at bedtime as needed (sleep).   Yes [provider]  dorzolamide -timolol  (COSOPT ) 2-0.5 % ophthalmic solution Place 1 drop into both eyes 2 (two) times daily.   Yes [provider]  latanoprost  (XALATAN ) 0.005 % ophthalmic solution Place 1 drop into both eyes at bedtime.   Yes [provider]  levothyroxine  (SYNTHROID ) 112 MCG tablet Take 1 tablet (112 mcg total) by mouth daily before breakfast. 04/17/23  Yes Adelia Homestead, MD  lisinopril -hydrochlorothiazide  (ZESTORETIC ) 20-25 MG tablet Take 1 tablet by mouth daily. 04/17/23  Yes Adelia Homestead, MD  Melatonin 10 MG TABS  Take 1 tablet by mouth at bedtime as needed.   Yes [provider]  metFORMIN  (GLUCOPHAGE -XR) 500 MG 24 hr tablet Take 3 tablets (1,500 mg total) by mouth daily with breakfast. 09/11/23  Yes Adelia Homestead, MD  montelukast  (SINGULAIR ) 10 MG tablet Take 10 mg by mouth at bedtime.   Yes [provider]  Multiple Vitamins-Minerals (PRESERVISION AREDS 2) CAPS Take 1 capsule by mouth 2 (two) times daily.   Yes [provider]  omeprazole  (PRILOSEC) 20 MG capsule Take 1 capsule (20 mg total) by mouth daily. 05/10/23  Yes Tobin Forts, MD  OneTouch Delica Lancets 30G MISC USE 1  TO CHECK GLUCOSE ONCE DAILY 07/12/22  Yes Lajean Pike, MD  [facility] VERIO test strip USE 1 STRIP TO CHECK GLUCOSE ONCE DAILY 01/14/24  Yes Adelia Homestead, MD  polyethylene glycol (MIRALAX  / GLYCOLAX ) 17 g packet Take 17 g by mouth daily. Patient taking differently: Take 17 g by mouth daily as needed for moderate constipation. 08/11/23  Yes Vada Garibaldi, MD    Allergies  Allergen Reactions   Flagyl  [Metronidazole ]     Redness on arms    Patient Active Problem List   Diagnosis Date Noted   S/P laparoscopic-assisted sigmoidectomy 11/14/2023   Sigmoid diverticulitis 08/06/2023   Dysuria 07/28/2023   Heart murmur 07/28/2023   Breast lump 06/14/2022   Uterine leiomyoma 06/14/2022   Bilateral carpal tunnel syndrome 08/24/2021   Controlled type 2 diabetes mellitus (HCC) 10/19/2017   Chronic myringitis, left ear 07/12/2015  Routine health maintenance 07/24/2012   Hyperlipidemia associated with type 2 diabetes mellitus (HCC) 07/23/2012   GERD 09/05/2010   Hypothyroidism 09/04/2007   Essential hypertension 09/04/2007    Past Medical History:  Diagnosis Date   Acne    Allergy    Anemia    hx of   Arthritis    Carpal tunnel syndrome of left wrist 10/21/2021   Cataract    Complication of anesthesia    could hear sounds with r breast biopsy yrs ago,   Depression    situational  when husband was sick and dying.   Diabetes mellitus Type 2    Diverticulosis    GERD (gastroesophageal reflux disease)    Glaucoma    Goiter    benign   right 5.7 cm/3.0x1.8cm left 5.2 x 2.4 x 2.1 cm per 05-09-2019 us  epic no further follow up needed   H. pylori infection    Heart murmur    History of COVID-19 2020   sick x 1 week all symptoms resolved   Hypercholesteremia    Hypertension    Hypothyroidism    Insomnia    Pneumonia    PONV (postoperative nausea and vomiting)    x 1 yrs ago per pt on 10-24-2021   Tinnitus, left ear 10/24/2021   for last 30 yrs   Unspecified Eustachian tube disorder, left ear     Past Surgical History:  Procedure Laterality Date   ABDOMINAL HYSTERECTOMY     both ovary intact age 3   BREAST LUMPECTOMY Bilateral    benign mmore than 35 yrs ago   CARPAL TUNNEL RELEASE Left 10/27/2021   Procedure: CARPAL TUNNEL RELEASE;  Surgeon: Ltanya Rummer, MD;  Location: [facility];  Service: Orthopedics;  Laterality: Left;   carpel tunnel surgery Right    right 30 yrs ago per pt on 10-24-2021   CARPOMETACARPEL SUSPENSION PLASTY Left 10/27/2021   Procedure: left thumb carpometacarpal joint arthroplasty with tendon transfer;  Surgeon: Ltanya Rummer, MD;  Location: [facility];  Service: Orthopedics;  Laterality: Left;  with MAC   EXCISION MASS UPPER EXTREMETIES Right 06/05/2022   Procedure: EXCISION MASS RIGHT SHOULDER;  Surgeon: Jacolyn Matar, MD;  Location: [facility];  Service: General;  Laterality: Right;   EYE SURGERY     cataracts yrs ago per pt on 10-24-2021   FLEXIBLE SIGMOIDOSCOPY N/A 11/14/2023   Procedure: FLEXIBLE SIGMOIDOSCOPY,;  Surgeon: Melvenia Stabs, MD;  Location: [facility];  Service: General;  Laterality: N/A;   left ear eardrum reconstruction     ponv after 40 yrs ago   right lower abdominal mass benign removed  1978   hernia right  lower abd   TYMPANOPLASTY     tube left ear multiple times  ober last 44 years   UPPER GI ENDOSCOPY     2014 and 2018   XI ROBOTIC ASSISTED LOWER ANTERIOR RESECTION N/A 11/14/2023   Procedure: XI ROBOTIC ASSISTED LOWER ANTERIOR RESECTION WITH COLORECTAL ANASTOMOSIS, ICG PERFUSION ASSESSMENT, BILATERAL TAP BLOCK;  Surgeon: Melvenia Stabs, MD;  Location: [facility];  Service: General;  Laterality: N/A;    Social History   Socioeconomic History   Marital status: Single    Spouse name: Not on file   Number of children: 2   Years of education: Not on file   Highest education level: Not on file  Occupational History   Occupation: bookkeeper  Tobacco Use   Smoking status: Never   Smokeless tobacco:  Never  Vaping Use   Vaping status: Never Used  Substance and Sexual Activity   Alcohol use: Not Currently    Comment: social   Drug use: Never   Sexual activity: Not Currently    Birth control/protection: Abstinence  Other Topics Concern   Not on file  Social History Narrative   Married '72-widowed April 08, 1996   HSG   1 daughter April 08, 1980, 1 son '20: son going thru divorce ['09]; applying to medical school   Work: bookkeeper [employer]   Social Drivers of Health   Financial Resource Strain: Low Risk  (02/12/2023)   Overall Financial Resource Strain (CARDIA)    Difficulty of Paying Living Expenses: Not hard at all  Food Insecurity: No Food Insecurity (11/19/2023)   Hunger Vital Sign    Worried About Running Out of Food in the Last Year: Never true    Ran Out of Food in the Last Year: Never true  Transportation Needs: No Transportation Needs (11/19/2023)   PRAPARE - Administrator, Civil Service (Medical): No    Lack of Transportation (Non-Medical): No  Physical Activity: Unknown (02/12/2023)   Exercise Vital Sign    Days of Exercise per Week: 4 days    Minutes of Exercise per Session: Patient declined  Stress: No Stress Concern Present (02/12/2023)   [employer] of Occupational Health - Occupational Stress Questionnaire    Feeling of  Stress : Not at all  Social Connections: Moderately Integrated (11/14/2023)   Social Connection and Isolation Panel [NHANES]    Frequency of Communication with Friends and Family: More than three times a week    Frequency of Social Gatherings with Friends and Family: More than three times a week    Attends Religious Services: 1 to 4 times per year    Active Member of [employer] or Organizations: No    Attends Engineer, structural: More than 4 times per year    Marital Status: Widowed  Intimate Partner Violence: Not At Risk (11/19/2023)   Humiliation, Afraid, Rape, and Kick questionnaire    Fear of Current or Ex-Partner: No    Emotionally Abused: No    Physically Abused: No    Sexually Abused: No    Family History  Problem Relation Age of Onset   Other Father 35       deceased, unknown causes in 2024-04-08   Hypertension Mother    Other Mother 39       SBO, deceased   Stroke Mother    Thyroid  disease Brother    Cancer Brother    Thyroid  disease Sister    Cancer Sister    Breast cancer Neg Hx    Colon cancer Neg Hx    Esophageal cancer Neg Hx    Rectal cancer Neg Hx    Stomach cancer Neg Hx    Diabetes Neg Hx      Review of Systems  Constitutional:  Positive for chills, fever and malaise/fatigue.  HENT:  Positive for congestion. Negative for sore throat.   Respiratory:  Positive for cough. Negative for shortness of breath.   Cardiovascular: Negative.  Negative for chest pain and palpitations.  Gastrointestinal:  Negative for abdominal pain, diarrhea, nausea and vomiting.  Genitourinary: Negative.  Negative for dysuria and hematuria.  Neurological:  Positive for dizziness.    Vitals:   02/25/24 1520  BP: (!) 148/74  Pulse: (!) 121  Temp: (!) 102.7 F (39.3 C)  SpO2: 94%    Physical Exam Constitutional:  Appearance: Normal appearance.  HENT:     Head: Normocephalic.     Mouth/Throat:     Mouth: Mucous membranes are moist.     Pharynx: Oropharynx is clear.   Eyes:     Extraocular Movements: Extraocular movements intact.     Pupils: Pupils are equal, round, and reactive to light.  Cardiovascular:     Rate and Rhythm: Regular rhythm. Tachycardia present.     Pulses: Normal pulses.     Heart sounds: Normal heart sounds.  Pulmonary:     Breath sounds: Rales (Dry crackles left lower posterior third) present.  Abdominal:     Palpations: Abdomen is soft.     Tenderness: There is no abdominal tenderness.  Skin:    General: Skin is warm and dry.  Neurological:     Mental Status: She is alert and oriented to person, place, and time.  Psychiatric:        Mood and Affect: Mood normal.        Behavior: Behavior normal.    Results for orders placed or performed in visit on 02/25/24 (from the past 24 hours)  POCT Influenza A/B     Status: None   Collection Time: 02/25/24  4:18 PM  Result Value Ref Range   Influenza A, POC Negative Negative   Influenza B, POC Negative Negative  POC COVID-19     Status: None   Collection Time: 02/25/24  4:19 PM  Result Value Ref Range   SARS Coronavirus 2 Ag Negative Negative      ASSESSMENT & PLAN: A total of 43 minutes was spent with the patient and counseling/coordination of care regarding preparing for this visit, review of most recent office visit notes, review of multiple chronic medical conditions and their management, diagnosis of community-acquired pneumonia and need for antibiotics, symptom management, review of chest x-ray images done today, review of all medications, review of most recent bloodwork results, review of health maintenance items, education on nutrition, prognosis, documentation, and need for follow up.   Problem List Items Addressed This Visit       Cardiovascular and Mediastinum   Essential hypertension   BP Readings from Last 3 Encounters:  02/25/24 (!) 148/74  11/17/23 120/67  11/05/23 (!) 156/85  Elevated blood pressure reading in the office but normal at home. Continue  Zestoretic  20-25 mg Cardiovascular risks associated with hypertension discussed         Respiratory   Community acquired pneumonia of left lower lobe of lung - Primary   Febrile and symptomatic Physical findings compatible with left lower lobe pneumonia Chest x-ray done today.  Will review images when available Recommend Rocephin  1 g IM and daily azithromycin  for 5 days ED precautions given Symptom management discussed Advised to rest and stay well-hydrated Advised to contact the office if no better or worse during the next several days      Relevant Medications   azithromycin  (ZITHROMAX ) 250 MG tablet   Other Relevant Orders   DG Chest 2 View   POC COVID-19 (Completed)   POCT Influenza A/B (Completed)     Endocrine   Hypothyroidism   Clinically euthyroid Lab Results  Component Value Date   TSH 1.23 02/16/2023  Continue Synthroid  112 mcg daily       Hyperlipidemia associated with type 2 diabetes mellitus (HCC)   Lab Results  Component Value Date   HGBA1C 5.7 (H) 11/05/2023  Well-controlled diabetes Continue metformin  and atorvastatin        Patient Instructions  Community-Acquired Pneumonia, Adult Pneumonia is an infection of the lungs. It causes irritation and swelling in the airways of the lungs. Mucus and fluid may also build up inside the airways. This may cause coughing and trouble breathing. One type of pneumonia can happen while you are in a hospital. A different type can happen when you are not in a hospital (community-acquired pneumonia). What are the causes?  This condition is caused by germs (viruses, bacteria, or fungi). Some types of germs can spread from person to person. Pneumonia is not thought to spread from person to person. What increases the risk? You have a long-term (chronic) disease, such as: Disease of the lungs. This may be chronic obstructive pulmonary disease (COPD) or asthma. Heart failure. Cystic fibrosis. Diabetes. Kidney  disease. Sickle cell disease. HIV. You have other health problems, such as: Your body's defense system (immune system) is weak. A condition that may cause you to breathe in fluids from your mouth and nose. You had your spleen taken out. You do not take good care of your teeth and mouth (poor dental hygiene). You use or have used tobacco products. You go where the germs that cause this illness are common. You are older than 74 years of age. What are the signs or symptoms? A cough. A fever. Sweating or chills. Chest pain, often when you breathe deeply or cough. Breathing problems, such as: Fast breathing. Trouble breathing. Shortness of breath. Feeling tired (fatigued). Muscle aches. How is this treated? Treatment for this condition depends on many things, such as: The cause of your illness. Your medicines. Your other health problems. Most adults can be treated at home. Sometimes, treatment must happen in a hospital. Treatment may include medicines to kill germs. Medicines may depend on which germ caused your illness. Very bad pneumonia is rare. If you get it, you may: Have a machine to help you breathe. Have fluid taken away from around your lungs. Follow these instructions at home: Medicines Take over-the-counter and prescription medicines only as told by your doctor. Take cough medicine only if you are losing sleep. Cough medicine can keep your body from taking mucus away from your lungs. If you were prescribed antibiotics, take them as told by your doctor. Do not stop taking them even if you start to feel better. Lifestyle     Do not smoke or use any products that contain nicotine or tobacco. If you need help quitting, ask your doctor. Do not drink alcohol. Eat a healthy diet. This includes a lot of vegetables, fruits, whole grains, low-fat dairy products, and low-fat (lean) protein. General instructions  Rest a lot. Sleep for at least 8 hours each night. Sleep with  your head and neck raised. Put a few pillows under your head or sleep in a reclining chair. Return to your normal activities as told by your doctor. Ask your doctor what activities are safe for you. Drink enough fluid to keep your pee (urine) pale yellow. If your throat is sore, gargle with a mixture of salt and water  3-4 times a day or as needed. To make salt water , completely dissolve -1 tsp (3-6 g) of salt in 1 cup (237 mL) of warm water . Keep all follow-up visits. How is this prevented? Getting the pneumonia shot (vaccine). These shots have different types and schedules. Ask your doctor what works best for you. Think about getting this shot if: You are older than 74 years of age. You are 22-55 years of age and: You are being  treated for cancer. You have long-term lung disease. You have other problems that affect your body's defense system. Ask your doctor if you have one of these. Getting your flu shot every year. Ask your doctor which type of shot is best for you. Going to the dentist as often as told. Washing your hands often with soap and water  for at least 20 seconds. If you cannot use soap and water , use hand sanitizer. Contact a doctor if: You have a fever. You lose sleep because your cough medicine does not help. Get help right away if: You are short of breath and this gets worse. You have more chest pain. Your sickness gets worse. This is very serious if: You are an older adult. Your body's defense system is weak. You cough up blood. These symptoms may be an emergency. Get help right away. Call 911. Do not wait to see if the symptoms will go away. Do not drive yourself to the hospital. Summary Pneumonia is an infection of the lungs. Community-acquired pneumonia affects people who have not been in the hospital. Certain germs can cause this infection. This condition may be treated with medicines that kill germs. For very bad pneumonia, you may need a hospital stay and  treatment to help with breathing. This information is not intended to replace advice given to you by your health care provider. Make sure you discuss any questions you have with your health care provider. Document Revised: 11/23/2021 Document Reviewed: 11/23/2021 Elsevier Patient Education  2024 Elsevier Inc.    Maryagnes Small, MD [facility]

## 2024-02-25 NOTE — Assessment & Plan Note (Signed)
 BP Readings from Last 3 Encounters:  02/25/24 (!) 148/74  11/17/23 120/67  11/05/23 (!) 156/85  Elevated blood pressure reading in the office but normal at home. Continue Zestoretic  20-25 mg Cardiovascular risks associated with hypertension discussed

## 2024-02-25 NOTE — Patient Instructions (Signed)

## 2024-02-25 NOTE — Telephone Encounter (Signed)
  Chief Complaint: chills, decreased appetite, equilibrium off, dizzy diarrhea (black stool) two days ago, hacking cough from throat, not chest, weigh loss Symptoms: 4 days Frequency: constant Pertinent Negatives: Patient denies fever Disposition: [] ED /[] Urgent Care (no appt availability in office) / [] Appointment(In office/virtual)/ []  [facility]/ [] Home Care/ [] Refused Recommended Disposition /[] [facility]/ [x]  Follow-up with PCP Additional Notes: appointment scheduled with PCP, will be driven by daughter.  Copied from CRM [id number]. Topic: Clinical - Pink Word Triage >> Feb 25, 2024 11:22 AM Melissa C wrote: Reason for Triage: patient cannot pinpoint what is making her feel "off" but she is either potassium deficient or equilibrium issue or something because she stated she was in the shower yesterday and she felt dizzy. >> Feb 25, 2024 12:45 PM Magdalene [employer] wrote: Patient called to verify if we have the correct phone number to call her. I informed her of the number we have and she confirmed that it is correct. Reason for Disposition  [1] MODERATE dizziness (e.g., interferes with normal activities) AND [2] has NOT been evaluated by doctor (or NP/PA) for this  (Exception: Dizziness caused by heat exposure, sudden standing, or poor fluid intake.)  Answer Assessment - Initial Assessment Questions 1. DESCRIPTION: "Describe your dizziness."     Feels like equilibrium off, dizzy 2. LIGHTHEADED: "Do you feel lightheaded?" (e.g., somewhat faint, woozy, weak upon standing)     Weak light headed 3. VERTIGO: "Do you feel like either you or the room is spinning or tilting?" (i.e. vertigo)    Patient states that she has ear problem, but denies spinning or tilting 4. SEVERITY: "How bad is it?"  "Do you feel like you are going to faint?" "Can you stand and walk?"   - MILD: Feels slightly dizzy, but walking normally.   - MODERATE: Feels unsteady when walking, but not falling; interferes  with normal activities (e.g., school, work).   - SEVERE: Unable to walk without falling, or requires assistance to walk without falling; feels like passing out now.      moderate 5. ONSET:  "When did the dizziness begin?"     Four days ago 6. AGGRAVATING FACTORS: "Does anything make it worse?" (e.g., standing, change in head position)     Worsens if she stands up too quickly, bends down, or turns her head too fast 7. HEART RATE: "Can you tell me your heart rate?" "How many beats in 15 seconds?"  (Note: not all patients can do this)      Wrist machine, BP 110/66 8. CAUSE: "What do you think is causing the dizziness?"     unsure 9. RECURRENT SYMPTOM: "Have you had dizziness before?" If Yes, ask: "When was the last time?" "What happened that time?"     About 9 years ago 10. OTHER SYMPTOMS: "Do you have any other symptoms?" (e.g., fever, chest pain, vomiting, diarrhea, bleeding)       Diarrhea, chills, decreased appetite, cough from throat, not chest, black diarrhea  Protocols used: Dizziness - Lightheadedness-A-AH

## 2024-02-25 NOTE — Assessment & Plan Note (Signed)
 Lab Results  Component Value Date   HGBA1C 5.7 (H) 11/05/2023  Well-controlled diabetes Continue metformin  and atorvastatin 

## 2024-02-25 NOTE — Telephone Encounter (Signed)
 Please see other NT encounter. Pt has already been triaged by Brian Campanile, RN. Pt has appt today at 320.   Summary: patient wants to know what caused her to feel dizzy yesterday   Copied From CRM [id number]. Reason for Triage: patient cannot pinpoint what is making her feel "off" but she is either potassium deficient or equilibrium issue or something because she stated she was in the shower yesterday and she felt dizzy.

## 2024-02-25 NOTE — Assessment & Plan Note (Signed)
 Febrile and symptomatic Physical findings compatible with left lower lobe pneumonia Chest x-ray done today.  Will review images when available Recommend Rocephin  1 g IM and daily azithromycin  for 5 days ED precautions given Symptom management discussed Advised to rest and stay well-hydrated Advised to contact the office if no better or worse during the next several days

## 2024-02-26 ENCOUNTER — Other Ambulatory Visit: Payer: Self-pay | Admitting: Internal Medicine

## 2024-02-26 ENCOUNTER — Ambulatory Visit (INDEPENDENT_AMBULATORY_CARE_PROVIDER_SITE_OTHER)

## 2024-02-26 ENCOUNTER — Ambulatory Visit: Payer: Self-pay | Admitting: Emergency Medicine

## 2024-02-26 VITALS — Ht 65.0 in | Wt 154.0 lb

## 2024-02-26 DIAGNOSIS — Z1231 Encounter for screening mammogram for malignant neoplasm of breast: Secondary | ICD-10-CM

## 2024-02-26 DIAGNOSIS — Z0000 Encounter for general adult medical examination without abnormal findings: Secondary | ICD-10-CM | POA: Diagnosis not present

## 2024-02-26 DIAGNOSIS — E785 Hyperlipidemia, unspecified: Secondary | ICD-10-CM

## 2024-02-26 DIAGNOSIS — E1169 Type 2 diabetes mellitus with other specified complication: Secondary | ICD-10-CM | POA: Diagnosis not present

## 2024-02-26 NOTE — Patient Instructions (Signed)
 [name] , Thank you for taking time out of your busy schedule to complete your Annual Wellness Visit with me. I enjoyed our conversation and look forward to speaking with you again next year. I, as well as your care team,  appreciate your ongoing commitment to your health goals. Please review the following plan we discussed and let me know if I can assist you in the future. Your Game plan/ To Do List    Referrals: If you haven't heard from the office you've been referred to, please reach out to them at the phone provided.  You have an order for:  [x]   Bone Density     Please call for appointment:  [facility] [address] [place], [place] [address] [phone]    Make sure to wear two-piece clothing.  No lotions, powders, or deodorants the day of the appointment. Make sure to bring picture ID and insurance card.  Bring list of medications you are currently taking including any supplements.   Follow up Visits: Next Medicare AWV with our clinical staff: 02/26/2025.   Have you seen your provider in the last 6 months (3 months if uncontrolled diabetes)? Yes Next Office Visit with your provider: 03/12/2024.  Clinician Recommendations:  Aim for 30 minutes of exercise or brisk walking, 6-8 glasses of water , and 5 servings of fruits and vegetables each day. You are due for a foot exam and a kidney evaluation.  These will be done during your up coming visit with Dr. Nicolette Brady.        This is a list of the screening recommended for you and due dates:  Health Maintenance  Topic Date Due   Mammogram  02/23/2017   Yearly kidney health urinalysis for diabetes  02/16/2024   COVID-19 Vaccine (5 - 2024-25 season) 03/12/2024*   Hemoglobin A1C  05/04/2024   Flu Shot  05/09/2024   Eye exam for diabetics  10/10/2024   Yearly kidney function blood test for diabetes  11/16/2024   Complete foot exam   02/25/2025   Medicare Annual Wellness Visit  02/25/2025   DTaP/Tdap/Td  vaccine (3 - Td or Tdap) 08/15/2031   Pneumonia Vaccine  Completed   DEXA scan (bone density measurement)  Completed   Hepatitis C Screening  Completed   Zoster (Shingles) Vaccine  Completed   HPV Vaccine  Aged Out   Meningitis B Vaccine  Aged Out   Colon Cancer Screening  Discontinued  *Topic was postponed. The date shown is not the original due date.    Advanced directives: (Copy Requested) Please bring a copy of your health care power of attorney and living will to the office to be added to your chart at your convenience. You can mail to [facility] [address] [place], [place] [address] or email to [email or web address] Advance Care Planning is important because it:  [x]  Makes sure you receive the medical care that is consistent with your values, goals, and preferences  [x]  It provides guidance to your family and loved ones and reduces their decisional burden about whether or not they are making the right decisions based on your wishes.  Follow the link provided in your after visit summary or read over the paperwork we have mailed to you to help you started getting your Advance Directives in place. If you need assistance in completing these, please reach out to us  so that we can help you!  See attachments for Preventive Care and Fall Prevention  Tips.

## 2024-02-26 NOTE — Progress Notes (Signed)
 Subjective:   Sonya Brady is a 73 y.o. who presents for a Medicare Wellness preventive visit.  As a reminder, Annual Wellness Visits don't include a physical exam, and some assessments may be limited, especially if this visit is performed virtually. We may recommend an in-person follow-up visit with your provider if needed.  Visit Complete: Virtual I connected with  Sonya Brady on 02/26/24 by a audio enabled telemedicine application and verified that I am speaking with the correct person using two identifiers.  Patient Location: Home  Provider Location: Home Office  I discussed the limitations of evaluation and management by telemedicine. The patient expressed understanding and agreed to proceed.  Vital Signs: Because this visit was a virtual/telehealth visit, some criteria may be missing or patient reported. Any vitals not documented were not able to be obtained and vitals that have been documented are patient reported.  VideoDeclined- This patient declined Librarian, academic. Therefore the visit was completed with audio only.  Persons Participating in Visit: Patient.  AWV Questionnaire: No: Patient Medicare AWV questionnaire was not completed prior to this visit.  Cardiac Risk Factors include: advanced age (>17men, >22 women);hypertension;dyslipidemia;diabetes mellitus     Objective:     Today's Vitals   02/26/24 0911  Weight: 154 lb (69.9 kg)  Height: 5\' 5"  (1.651 m)   Body mass index is 25.63 kg/m.     02/26/2024    9:39 AM 11/14/2023    1:00 PM 11/14/2023    7:27 AM 11/05/2023    1:38 PM 08/07/2023    7:36 PM 08/06/2023    6:42 PM 08/04/2023    2:18 PM  Advanced Directives  Does Patient Have a Medical Advance Directive? Yes Yes Yes Yes  No No  Type of Estate agent of [place];Living will Healthcare Power of [employer] of [place];Living will Living will;Healthcare Power of Attorney     Does  patient want to make changes to medical advance directive?  No - Patient declined  No - Patient declined     Copy of Healthcare Power of Attorney in Chart? No - copy requested No - copy requested  No - copy requested     Would patient like information on creating a medical advance directive?     No - Patient declined  No - Patient declined    Current Medications (verified) Outpatient Encounter Medications as of 02/26/2024  Medication Sig   atorvastatin  (LIPITOR) 80 MG tablet Take 1 tablet (80 mg total) by mouth daily. (Patient taking differently: Take 80 mg by mouth every evening.)   azithromycin  (ZITHROMAX ) 250 MG tablet Sig as indicated in Z-pak   Biotin  5000 MCG CAPS Take 5,000 mcg by mouth daily.   Blood Glucose Monitoring Suppl (ONETOUCH VERIO) w/Device KIT Use As Directed   clindamycin (CLEOCIN T) 1 % external solution 1 Application See admin instructions. Apply into the left ear every Sunday   diphenhydrAMINE  HCl, Sleep, (ZZZQUIL) 25 MG CAPS Take 25 mg by mouth at bedtime as needed (sleep).   dorzolamide -timolol  (COSOPT ) 2-0.5 % ophthalmic solution Place 1 drop into both eyes 2 (two) times daily.   latanoprost  (XALATAN ) 0.005 % ophthalmic solution Place 1 drop into both eyes at bedtime.   levothyroxine  (SYNTHROID ) 112 MCG tablet Take 1 tablet (112 mcg total) by mouth daily before breakfast.   lisinopril -hydrochlorothiazide  (ZESTORETIC ) 20-25 MG tablet Take 1 tablet by mouth daily.   Melatonin 10 MG TABS Take 1 tablet by mouth at bedtime as  needed.   metFORMIN  (GLUCOPHAGE -XR) 500 MG 24 hr tablet Take 3 tablets (1,500 mg total) by mouth daily with breakfast.   montelukast  (SINGULAIR ) 10 MG tablet Take 10 mg by mouth at bedtime.   Multiple Vitamins-Minerals (PRESERVISION AREDS 2) CAPS Take 1 capsule by mouth 2 (two) times daily.   omeprazole  (PRILOSEC) 20 MG capsule Take 1 capsule (20 mg total) by mouth daily.   OneTouch Delica Lancets 30G MISC USE 1  TO CHECK GLUCOSE ONCE DAILY   ONETOUCH  VERIO test strip USE 1 STRIP TO CHECK GLUCOSE ONCE DAILY   polyethylene glycol (MIRALAX  / GLYCOLAX ) 17 g packet Take 17 g by mouth daily. (Patient taking differently: Take 17 g by mouth daily as needed for moderate constipation.)   traMADol  (ULTRAM ) 50 MG tablet Take 50 mg by mouth every 6 (six) hours as needed.   No facility-administered encounter medications on file as of 02/26/2024.    Allergies (verified) Flagyl  [metronidazole ]   History: Past Medical History:  Diagnosis Date   Acne    Allergy    Anemia    hx of   Arthritis    Carpal tunnel syndrome of left wrist 10/21/2021   Cataract    Complication of anesthesia    could hear sounds with r breast biopsy yrs ago,   Depression    situational when husband was sick and dying.   Diabetes mellitus Type 2    Diverticulosis    GERD (gastroesophageal reflux disease)    Glaucoma    Goiter    benign   right 5.7 cm/3.0x1.8cm left 5.2 x 2.4 x 2.1 cm per 05-09-2019 us  epic no further follow up needed   H. pylori infection    Heart murmur    History of COVID-19 2020   sick x 1 week all symptoms resolved   Hypercholesteremia    Hypertension    Hypothyroidism    Insomnia    Pneumonia    PONV (postoperative nausea and vomiting)    x 1 yrs ago per pt on 10-24-2021   Tinnitus, left ear 10/24/2021   for last 30 yrs   Unspecified Eustachian tube disorder, left ear    Past Surgical History:  Procedure Laterality Date   ABDOMINAL HYSTERECTOMY     both ovary intact age 30   BREAST LUMPECTOMY Bilateral    benign mmore than 35 yrs ago   CARPAL TUNNEL RELEASE Left 10/27/2021   Procedure: CARPAL TUNNEL RELEASE;  Surgeon: Ltanya Rummer, MD;  Location: [facility] ;  Service: Orthopedics;  Laterality: Left;   carpel tunnel surgery Right    right 30 yrs ago per pt on 10-24-2021   CARPOMETACARPEL SUSPENSION PLASTY Left 10/27/2021   Procedure: left thumb carpometacarpal joint arthroplasty with tendon transfer;  Surgeon: Ltanya Rummer, MD;  Location: [facility];  Service: Orthopedics;  Laterality: Left;  with MAC   EXCISION MASS UPPER EXTREMETIES Right 06/05/2022   Procedure: EXCISION MASS RIGHT SHOULDER;  Surgeon: Jacolyn Matar, MD;  Location: [facility];  Service: General;  Laterality: Right;   EYE SURGERY     cataracts yrs ago per pt on 10-24-2021   FLEXIBLE SIGMOIDOSCOPY N/A 11/14/2023   Procedure: FLEXIBLE SIGMOIDOSCOPY,;  Surgeon: Melvenia Stabs, MD;  Location: [facility];  Service: General;  Laterality: N/A;   left ear eardrum reconstruction     ponv after 40 yrs ago   right lower abdominal mass benign removed  1978   hernia right  lower abd  TYMPANOPLASTY     tube left ear multiple times ober last 44 years   UPPER GI ENDOSCOPY     March 14, 2013 and 03/14/2017   XI ROBOTIC ASSISTED LOWER ANTERIOR RESECTION N/A 11/14/2023   Procedure: XI ROBOTIC ASSISTED LOWER ANTERIOR RESECTION WITH COLORECTAL ANASTOMOSIS, ICG PERFUSION ASSESSMENT, BILATERAL TAP BLOCK;  Surgeon: Melvenia Stabs, MD;  Location: [facility];  Service: General;  Laterality: N/A;   Family History  Problem Relation Age of Onset   Other Father 62       deceased, unknown causes in 2024-03-14   Hypertension Mother    Other Mother 4       SBO, deceased   Stroke Mother    Thyroid  disease Brother    Cancer Brother    Thyroid  disease Sister    Cancer Sister    Breast cancer Neg Hx    Colon cancer Neg Hx    Esophageal cancer Neg Hx    Rectal cancer Neg Hx    Stomach cancer Neg Hx    Diabetes Neg Hx    Social History   Socioeconomic History   Marital status: Single    Spouse name: Not on file   Number of children: 2   Years of education: Not on file   Highest education level: Not on file  Occupational History   Occupation: bookkeeper   Occupation: RETIRED  Tobacco Use   Smoking status: Never   Smokeless tobacco: Never  Vaping Use   Vaping status: Never Used  Substance and Sexual Activity   Alcohol use: Not Currently     Comment: social   Drug use: Never   Sexual activity: Not Currently    Birth control/protection: Abstinence  Other Topics Concern   Not on file  Social History Narrative   Married '72-widowed '[id number] daughter 14-Mar-1980, 1 son '6: son going thru divorce ['09]; applying to medical schoolWork: bookkeeper [employer]      Children lives with patient/2025   Social Drivers of Health   Financial Resource Strain: Low Risk  (02/26/2024)   Overall Financial Resource Strain (CARDIA)    Difficulty of Paying Living Expenses: Not hard at all  Food Insecurity: No Food Insecurity (02/26/2024)   Hunger Vital Sign    Worried About Running Out of Food in the Last Year: Never true    Ran Out of Food in the Last Year: Never true  Transportation Needs: No Transportation Needs (02/26/2024)   PRAPARE - Administrator, Civil Service (Medical): No    Lack of Transportation (Non-Medical): No  Physical Activity: Sufficiently Active (02/26/2024)   Exercise Vital Sign    Days of Exercise per Week: 3 days    Minutes of Exercise per Session: 60 min  Stress: No Stress Concern Present (02/26/2024)   [employer] of Occupational Health - Occupational Stress Questionnaire    Feeling of Stress : Not at all  Social Connections: Moderately Integrated (02/26/2024)   Social Connection and Isolation Panel [NHANES]    Frequency of Communication with Friends and Family: More than three times a week    Frequency of Social Gatherings with Friends and Family: Once a week    Attends Religious Services: More than 4 times per year    Active Member of [employer] or Organizations: Yes    Attends Banker Meetings: Never    Marital Status: Widowed    Tobacco Counseling Counseling given: Not Answered    Clinical Intake:  Pre-visit preparation completed: Yes  Pain :  No/denies pain     BMI - recorded: 25.63 Nutritional Status: BMI 25 -29 Overweight Nutritional Risks: None Diabetes: Yes CBG done?:  No Did pt. bring in CBG monitor from home?: No  Lab Results  Component Value Date   HGBA1C 5.7 (H) 11/05/2023   HGBA1C 6.2 08/28/2023   HGBA1C 6.2 02/16/2023     How often do you need to have someone help you when you read instructions, pamphlets, or other written materials from your doctor or pharmacy?: 1 - Never  Interpreter Needed?: No  Information entered by :: Novice Vrba, RMA   Activities of Daily Living     02/26/2024    9:11 AM 11/14/2023    1:00 PM  In your present state of health, do you have any difficulty performing the following activities:  Hearing? 0 0  Vision? 0 0  Difficulty concentrating or making decisions? 0 0  Walking or climbing stairs? 0   Dressing or bathing? 0   Doing errands, shopping? 0 0  Preparing Food and eating ? N   Using the Toilet? N   In the past six months, have you accidently leaked urine? N   Do you have problems with loss of bowel control? N   Managing your Medications? N   Managing your Finances? N   Housekeeping or managing your Housekeeping? N     Patient Care Team: Adelia Homestead, MD as PCP - General (Internal Medicine) Tessa Figures, MD (Gastroenterology)  Indicate any recent Medical Services you may have received from other than [facility] providers in the past year (date may be approximate).     Assessment:    This is a routine wellness examination for [facility].  Hearing/Vision screen Hearing Screening - Comments:: Denies hearing difficulties   Vision Screening - Comments:: Denies vision issues./Dr. Roslynn Coombes    Goals Addressed             This Visit's Progress    My goal is to keep my weight down, control my food intake and stay independent as possible.   On track      Depression Screen     02/26/2024    9:42 AM 02/25/2024    3:34 PM 07/25/2023    8:21 AM 04/04/2023    3:44 PM 02/16/2023    8:43 AM 02/07/2022    2:46 PM 01/31/2022   10:22 AM  PHQ 2/9 Scores  PHQ - 2 Score 0 0 0 0 0 2 0  PHQ- 9 Score 0    0 0 6     Fall Risk     02/26/2024    9:39 AM 02/25/2024    3:34 PM 07/25/2023    8:21 AM 04/04/2023    3:44 PM 02/16/2023    8:43 AM  Fall Risk   Falls in the past year? 0 0 0 0 0  Number falls in past yr: 0 0 0 0 0  Injury with Fall? 0 0 0 0 0  Risk for fall due to :  No Fall Risks No Fall Risks No Fall Risks   Follow up Falls prevention discussed;Falls evaluation completed Falls evaluation completed Falls evaluation completed Falls evaluation completed Falls evaluation completed    MEDICARE RISK AT HOME:  Medicare Risk at Home Any stairs in or around the home?: Yes If so, are there any without handrails?: Yes Home free of loose throw rugs in walkways, pet beds, electrical cords, etc?: Yes Adequate lighting in your home to reduce risk of falls?:  Yes Life alert?: No Use of a cane, walker or w/c?: No Grab bars in the bathroom?: Yes Shower chair or bench in shower?: No Elevated toilet seat or a handicapped toilet?: No  TIMED UP AND GO:  Was the test performed?  No  Cognitive Function: Declined/Normal: No cognitive concerns noted by patient or family. Patient alert, oriented, able to answer questions appropriately and recall recent events. No signs of memory loss or confusion.        01/31/2022   10:21 AM  6CIT Screen  What Year? 0 points  What month? 0 points  What time? 0 points  Count back from 20 0 points  Months in reverse 0 points  Repeat phrase 0 points  Total Score 0 points    Immunizations Immunization History  Administered Date(s) Administered   Fluad Quad(high Dose 65+) 07/12/2019, 08/04/2020, 07/28/2021, 07/12/2022   Fluad Trivalent(High Dose 65+) 07/06/2023   Influenza Whole 07/13/2009, 07/04/2012   Influenza, High Dose Seasonal PF 08/02/2017, 07/15/2018   Influenza-Unspecified 07/09/2013, 07/08/2015   PFIZER(Purple Top)SARS-COV-2 Vaccination 12/24/2019, 01/14/2020, 07/20/2020, 02/24/2021   Pneumococcal Conjugate-13 07/09/2015   Pneumococcal  Polysaccharide-23 07/18/2016   Td 08/12/2009   Tdap 08/14/2021   Zoster Recombinant(Shingrix) 12/12/2020, 09/26/2021   Zoster, Live 11/27/2012    Screening Tests Health Maintenance  Topic Date Due   MAMMOGRAM  02/23/2017   Diabetic kidney evaluation - Urine ACR  02/16/2024   COVID-19 Vaccine (5 - 2024-25 season) 03/12/2024 (Originally 06/10/2023)   HEMOGLOBIN A1C  05/04/2024   INFLUENZA VACCINE  05/09/2024   OPHTHALMOLOGY EXAM  10/10/2024   Diabetic kidney evaluation - eGFR measurement  11/16/2024   FOOT EXAM  02/25/2025   Medicare Annual Wellness (AWV)  02/25/2025   DTaP/Tdap/Td (3 - Td or Tdap) 08/15/2031   Pneumonia Vaccine 22+ Years old  Completed   DEXA SCAN  Completed   Hepatitis C Screening  Completed   Zoster Vaccines- Shingrix  Completed   HPV VACCINES  Aged Out   Meningococcal B Vaccine  Aged Out   Colonoscopy  Discontinued    Health Maintenance  Health Maintenance Due  Topic Date Due   MAMMOGRAM  02/23/2017   Diabetic kidney evaluation - Urine ACR  02/16/2024   Health Maintenance Items Addressed: Mammogram ordered, Diabetic Foot Exam scheduled, UACR (Urine Albumin:Creatinine Ratio), See Nurse Notes  Additional Screening:  Vision Screening: Recommended annual ophthalmology exams for early detection of glaucoma and other disorders of the eye.  Dental Screening: Recommended annual dental exams for proper oral hygiene  Community Resource Referral / Chronic Care Management: CRR required this visit?  No   CCM required this visit?  No   Plan:    I have personally reviewed and noted the following in the patient's chart:   Medical and social history Use of alcohol, tobacco or illicit drugs  Current medications and supplements including opioid prescriptions. Patient is not currently taking opioid prescriptions. Functional ability and status Nutritional status Physical activity Advanced directives List of other physicians Hospitalizations, surgeries, and  ER visits in previous 12 months Vitals Screenings to include cognitive, depression, and falls Referrals and appointments  In addition, I have reviewed and discussed with patient certain preventive protocols, quality metrics, and best practice recommendations. A written personalized care plan for preventive services as well as general preventive health recommendations were provided to patient.   Keaira Whitehurst L Brain Honeycutt, CMA   02/26/2024   After Visit Summary: (MyChart) Due to this being a telephonic visit, the after visit summary with patients  personalized plan was offered to patient via MyChart   Notes: Please refer to Routing Comments.

## 2024-03-05 ENCOUNTER — Telehealth: Payer: Self-pay | Admitting: Internal Medicine

## 2024-03-05 NOTE — Telephone Encounter (Unsigned)
 Copied from CRM [id number]. Topic: General - Other >> Mar 05, 2024  8:13 AM Sonya Brady wrote: Reason for CRM: Patient called in stated she has yeast infection on her tongue thinks its due to the antibiotics that she took this week, doesn't know if there is anything that she be prescribed for this

## 2024-03-06 ENCOUNTER — Other Ambulatory Visit: Payer: Self-pay | Admitting: Emergency Medicine

## 2024-03-06 ENCOUNTER — Ambulatory Visit: Payer: Self-pay | Admitting: *Deleted

## 2024-03-06 ENCOUNTER — Ambulatory Visit (INDEPENDENT_AMBULATORY_CARE_PROVIDER_SITE_OTHER)
Admission: RE | Admit: 2024-03-06 | Discharge: 2024-03-06 | Disposition: A | Discharge status: Home / Self Care | Source: Ambulatory Visit | Attending: Internal Medicine | Admitting: Internal Medicine

## 2024-03-06 DIAGNOSIS — Z13820 Encounter for screening for osteoporosis: Secondary | ICD-10-CM

## 2024-03-06 DIAGNOSIS — Z1231 Encounter for screening mammogram for malignant neoplasm of breast: Secondary | ICD-10-CM

## 2024-03-06 MED ORDER — NYSTATIN 100000 UNIT/ML MT SUSP: 5.0000 mL | Freq: Four times a day (QID) | OROMUCOSAL | 0 refills | Status: AC

## 2024-03-06 NOTE — Telephone Encounter (Signed)
 Prescription for nystatin suspension sent to pharmacy of record today.

## 2024-03-06 NOTE — Telephone Encounter (Signed)
 Pt. Disconnected the line before agent could connect to a nurse.Left message to call back about her symptoms.

## 2024-03-06 NOTE — Telephone Encounter (Signed)
  Chief Complaint: "yeast infection" on tongue  Symptoms: white patches on tongue after taking antibiotics. Continued cough  Frequency: couple of days  Pertinent Negatives: Patient denies difficulty swallowing , no swelling Disposition: [] ED /[] Urgent Care (no appt availability in office) / [] Appointment(In office/virtual)/ []  [facility]/ [] Home Care/ [] Refused Recommended Disposition /[] [facility]/ [x]  Follow-up with PCP Additional Notes:   Please advise if patient needs another appt. Last OV with Dr. Sagardia 02/25/24. Please advise if medication can be prescribed for sx.      Copied from CRM [id number]. Topic: Clinical - Red Word Triage >> Mar 06, 2024 11:37 AM Sonya Brady wrote: Red Word that prompted transfer to Nurse Triage: Patient was speaking to some earlier for a  yeast infection on her tongue. Patient was disconnected before speaking with a nurse. Reason for Disposition  [1] White patches that stick to tongue or inner cheek AND [2] can be wiped off  Answer Assessment - Initial Assessment Questions 1. SYMPTOM: "What's the main symptom you're concerned about?" (e.g., chapped lips, dry mouth, lump, sores)     Tongue with white areas noted after taking antibiotics 2. ONSET: "When did the  sx   start?"     Couple of days ago  3. PAIN: "Is there any pain?" If Yes, ask: "How bad is it?" (Scale: 1-10; mild, moderate, severe)   - MILD (1-3):  doesn't interfere with eating or normal activities   - MODERATE (4-7): interferes with eating some solids and normal activities   - SEVERE (8-10):  excruciating pain, interferes with most normal activities   - SEVERE DYSPHAGIA: can't swallow liquids, drooling     No pain 4. CAUSE: "What do you think is causing the symptoms?"     antibiotics 5. OTHER SYMPTOMS: "Do you have any other symptoms?" (e.g., fever, sore throat, toothache, swelling)     Cough continues, tongue white areas thick white and will not come off with rubbing   6. PREGNANCY: "Is there any chance you are pregnant?" "When was your last menstrual period?"     na  Protocols used: Mouth Symptoms-A-AH

## 2024-03-11 ENCOUNTER — Ambulatory Visit: Payer: Self-pay | Admitting: Internal Medicine

## 2024-03-12 ENCOUNTER — Other Ambulatory Visit (HOSPITAL_COMMUNITY): Payer: Self-pay

## 2024-03-12 ENCOUNTER — Ambulatory Visit (INDEPENDENT_AMBULATORY_CARE_PROVIDER_SITE_OTHER): Admitting: Internal Medicine

## 2024-03-12 ENCOUNTER — Encounter: Payer: Self-pay | Admitting: Internal Medicine

## 2024-03-12 ENCOUNTER — Telehealth: Payer: Self-pay

## 2024-03-12 ENCOUNTER — Ambulatory Visit (INDEPENDENT_AMBULATORY_CARE_PROVIDER_SITE_OTHER)

## 2024-03-12 VITALS — BP 122/80 | HR 80 | Temp 98.0°F | Ht 65.0 in | Wt 153.0 lb

## 2024-03-12 DIAGNOSIS — E039 Hypothyroidism, unspecified: Secondary | ICD-10-CM

## 2024-03-12 DIAGNOSIS — E785 Hyperlipidemia, unspecified: Secondary | ICD-10-CM

## 2024-03-12 DIAGNOSIS — I10 Essential (primary) hypertension: Secondary | ICD-10-CM | POA: Diagnosis not present

## 2024-03-12 DIAGNOSIS — Z7984 Long term (current) use of oral hypoglycemic drugs: Secondary | ICD-10-CM

## 2024-03-12 DIAGNOSIS — E118 Type 2 diabetes mellitus with unspecified complications: Secondary | ICD-10-CM

## 2024-03-12 DIAGNOSIS — Z0000 Encounter for general adult medical examination without abnormal findings: Secondary | ICD-10-CM | POA: Diagnosis not present

## 2024-03-12 DIAGNOSIS — J189 Pneumonia, unspecified organism: Secondary | ICD-10-CM | POA: Diagnosis not present

## 2024-03-12 DIAGNOSIS — M810 Age-related osteoporosis without current pathological fracture: Secondary | ICD-10-CM

## 2024-03-12 DIAGNOSIS — K219 Gastro-esophageal reflux disease without esophagitis: Secondary | ICD-10-CM | POA: Diagnosis not present

## 2024-03-12 DIAGNOSIS — E1169 Type 2 diabetes mellitus with other specified complication: Secondary | ICD-10-CM

## 2024-03-12 DIAGNOSIS — Z09 Encounter for follow-up examination after completed treatment for conditions other than malignant neoplasm: Secondary | ICD-10-CM | POA: Diagnosis not present

## 2024-03-12 HISTORY — DX: Age-related osteoporosis without current pathological fracture: M81.0

## 2024-03-12 LAB — COMPREHENSIVE METABOLIC PANEL WITH GFR
ALT: 15 U/L (ref 0–35)
AST: 19 U/L (ref 0–37)
Albumin: 4.3 g/dL (ref 3.5–5.2)
Alkaline Phosphatase: 76 U/L (ref 39–117)
BUN: 15 mg/dL (ref 6–23)
CO2: 30 meq/L (ref 19–32)
Calcium: 10.1 mg/dL (ref 8.4–10.5)
Chloride: 100 meq/L (ref 96–112)
Creatinine, Ser: 0.69 mg/dL (ref 0.40–1.20)
GFR: 85.5 mL/min (ref >60.00)
Glucose, Bld: 110 mg/dL — ABNORMAL HIGH (ref 70–99)
Potassium: 4.3 meq/L (ref 3.5–5.1)
Sodium: 136 meq/L (ref 135–145)
Total Bilirubin: 0.6 mg/dL (ref 0.2–1.2)
Total Protein: 7.7 g/dL (ref 6.0–8.3)

## 2024-03-12 LAB — CBC
HCT: 36.9 % (ref 36.0–46.0)
Hemoglobin: 12.3 g/dL (ref 12.0–15.0)
MCHC: 33.3 g/dL (ref 30.0–36.0)
MCV: 87.3 fl (ref 78.0–100.0)
Platelets: 359 10*3/uL (ref 150.0–400.0)
RBC: 4.22 Mil/uL (ref 3.87–5.11)
RDW: 14.2 % (ref 11.5–15.5)
WBC: 4.6 10*3/uL (ref 4.0–10.5)

## 2024-03-12 LAB — LIPID PANEL
Cholesterol: 150 mg/dL (ref 0–200)
HDL: 43.7 mg/dL (ref >39.00)
LDL Cholesterol: 81 mg/dL (ref 0–99)
NonHDL: 106.16
Total CHOL/HDL Ratio: 3
Triglycerides: 125 mg/dL (ref 0.0–149.0)
VLDL: 25 mg/dL (ref 0.0–40.0)

## 2024-03-12 LAB — VITAMIN B12: Vitamin B-12: 584 pg/mL (ref 211–911)

## 2024-03-12 LAB — MICROALBUMIN / CREATININE URINE RATIO
Creatinine,U: 82.2 mg/dL
Microalb Creat Ratio: 9.2 mg/g (ref 0.0–30.0)
Microalb, Ur: 0.8 mg/dL (ref 0.0–1.9)

## 2024-03-12 LAB — HEMOGLOBIN A1C: Hgb A1c MFr Bld: 6.3 % (ref 4.6–6.5)

## 2024-03-12 LAB — T4, FREE: Free T4: 1.62 ng/dL — ABNORMAL HIGH (ref 0.60–1.60)

## 2024-03-12 LAB — VITAMIN D 25 HYDROXY (VIT D DEFICIENCY, FRACTURES): VITD: 31.11 ng/mL (ref 30.00–100.00)

## 2024-03-12 LAB — TSH: TSH: 2.81 u[IU]/mL (ref 0.35–5.50)

## 2024-03-12 MED ORDER — ATORVASTATIN CALCIUM 80 MG PO TABS: 80.0000 mg | ORAL_TABLET | Freq: Every day | ORAL | 3 refills | Status: AC

## 2024-03-12 MED ORDER — SCOPOLAMINE 1 MG/3DAYS TD PT72: 1.0000 | MEDICATED_PATCH | TRANSDERMAL | 0 refills | Status: AC

## 2024-03-12 MED ORDER — LEVOTHYROXINE SODIUM 112 MCG PO TABS: 112.0000 ug | ORAL_TABLET | Freq: Every day | ORAL | 3 refills | Status: AC

## 2024-03-12 MED ORDER — LISINOPRIL-HYDROCHLOROTHIAZIDE 20-25 MG PO TABS: 1.0000 | ORAL_TABLET | Freq: Every day | ORAL | 3 refills | Status: AC

## 2024-03-12 MED ORDER — METFORMIN HCL ER 500 MG PO TB24: 500.0000 mg | ORAL_TABLET | Freq: Every day | ORAL | 3 refills | Status: DC

## 2024-03-12 NOTE — Patient Instructions (Addendum)
 For the calcium  take 500 mg twice a day.   The patch last 3 days and you place behind the ear. Switch ears with each patch. The main side effect is dry mouth which generally is mild. It can be bad enough that people remove the patch. Make sure to wash hands well after applying or removing patch as the medicine can be harmful if it accidentally gets into the eye.

## 2024-03-12 NOTE — Progress Notes (Signed)
   Subjective:   Patient ID: Sonya Brady, female    DOB: Nov 02, 1950, 73 y.o.   MRN: [id number]  HPI The patient is here for physical. Needs follow up recent pneumonia still very tired no SOB or cough.  PMH, [facility], social history reviewed and updated  Review of Systems  Constitutional:  Positive for fatigue.  HENT: Negative.    Eyes: Negative.   Respiratory:  Negative for cough, chest tightness and shortness of breath.   Cardiovascular:  Negative for chest pain, palpitations and leg swelling.  Gastrointestinal:  Negative for abdominal distention, abdominal pain, constipation, diarrhea, nausea and vomiting.  Musculoskeletal: Negative.   Skin: Negative.   Neurological: Negative.   Psychiatric/Behavioral: Negative.      Objective:  Physical Exam Constitutional:      Appearance: She is well-developed.  HENT:     Head: Normocephalic and atraumatic.  Cardiovascular:     Rate and Rhythm: Normal rate and regular rhythm.  Pulmonary:     Effort: Pulmonary effort is normal. No respiratory distress.     Breath sounds: Normal breath sounds. No wheezing or rales.  Abdominal:     General: Bowel sounds are normal. There is no distension.     Palpations: Abdomen is soft.     Tenderness: There is no abdominal tenderness. There is no rebound.  Musculoskeletal:     Cervical back: Normal range of motion.  Skin:    General: Skin is warm and dry.     Comments: Foot exam done  Neurological:     Mental Status: She is alert and oriented to person, place, and time.     Coordination: Coordination normal.     Vitals:   03/12/24 0904  BP: 122/80  Pulse: 80  Temp: 98 F (36.7 C)  TempSrc: Oral  SpO2: 95%  Weight: 153 lb (69.4 kg)  Height: 5\' 5"  (1.651 m)    Assessment & Plan:

## 2024-03-12 NOTE — Assessment & Plan Note (Signed)
 Checking UACR, lipid panel, HgA1c, CMP. Adjust metformin  as needed. Is on statin and ACE-I. Foot exam done.

## 2024-03-12 NOTE — Assessment & Plan Note (Signed)
 Checking TSH and free T4 given fatigue and adjust levothyroxine  112 mcg daily as needed.

## 2024-03-12 NOTE — Assessment & Plan Note (Signed)
 Needs repeat CXR today. Still fatigued checking vitamin D , B12.

## 2024-03-12 NOTE — Assessment & Plan Note (Signed)
 Taking omeprazole  and controlled. Continue.

## 2024-03-12 NOTE — Assessment & Plan Note (Signed)
 BP at goal on lisinopril /hydrochlorothiazide  and checking CMP adjust as needed.

## 2024-03-12 NOTE — Telephone Encounter (Signed)
 Pharmacy Patient Advocate Encounter   Received notification from CoverMyMeds that prior authorization for Scopolamine 1MG /3DAYS 72 hr patches is required/requested.   Insurance verification completed.   The patient is insured through [facility] .   Per test claim: PA required; PA submitted to above mentioned insurance via CoverMyMeds Key/confirmation #/EOC (Key: [id number])    Status is pending

## 2024-03-12 NOTE — Assessment & Plan Note (Signed)
Checking lipid panel and adjust lipitor as needed.  

## 2024-03-12 NOTE — Assessment & Plan Note (Addendum)
Flu shot yearly. Pneumonia complete. Shingrix complete. Tetanus up to date. Colonoscopy up to date. Mammogram up to date, pap smear aged out and dexa up to date. Counseled about sun safety and mole surveillance. Counseled about the dangers of distracted driving. Given 10 year screening recommendations.   

## 2024-03-12 NOTE — Assessment & Plan Note (Signed)
 She wishes to try calcium  supplementation and recheck in 2 years. Given information about amounts.

## 2024-03-12 NOTE — Telephone Encounter (Signed)
 Pt was seen today by provider and this was addressed

## 2024-03-13 ENCOUNTER — Other Ambulatory Visit (HOSPITAL_COMMUNITY): Payer: Self-pay

## 2024-03-13 NOTE — Telephone Encounter (Signed)
 Pharmacy Patient Advocate Encounter  Received notification from [facility] that Prior Authorization for Scopolamine 1MG /3DAYS 72 hr patches has been APPROVED from 1.1.25 to 12.31.25. Ran test claim, Copay is $5.98. This test claim was processed through [facility]- copay amounts may vary at other pharmacies due to pharmacy/plan contracts, or as the patient moves through the different stages of their insurance plan.   PA #/Case ID/Reference #: (Key: [id number])

## 2024-03-14 ENCOUNTER — Ambulatory Visit: Payer: Self-pay | Admitting: Internal Medicine

## 2024-03-26 ENCOUNTER — Other Ambulatory Visit: Payer: Self-pay | Admitting: Internal Medicine

## 2024-04-14 ENCOUNTER — Telehealth: Payer: Self-pay

## 2024-04-14 NOTE — Telephone Encounter (Signed)
 Copied from CRM [id number]. Topic: Clinical - Medication Question >> Apr 14, 2024  9:09 AM Mesmerise C wrote: Reason for CRM: Patient stated when she picked up her metFORMIN  (GLUCOPHAGE -XR) 500 MG 24 hr tablet the SIG shows 1 tablet a day when it used to be 3x a times inquiring why that's changed and if that's how she's supposed to take it now, states if she's supposed to check it once a day now needs to come in and check A1C patient can be reached [phone]

## 2024-04-16 MED ORDER — METFORMIN HCL ER 500 MG PO TB24: 1500.0000 mg | ORAL_TABLET | Freq: Every day | ORAL | 3 refills | Status: AC

## 2024-04-16 NOTE — Telephone Encounter (Signed)
**Note De-identified  [place] Obfuscation** Please advise 

## 2024-04-16 NOTE — Telephone Encounter (Signed)
 Sent in appropriate rx

## 2024-04-23 DIAGNOSIS — H7312 Chronic myringitis, left ear: Secondary | ICD-10-CM | POA: Diagnosis not present

## 2024-04-23 DIAGNOSIS — H6522 Chronic serous otitis media, left ear: Secondary | ICD-10-CM | POA: Diagnosis not present

## 2024-06-06 ENCOUNTER — Other Ambulatory Visit: Payer: Self-pay | Admitting: Internal Medicine

## 2024-06-18 DIAGNOSIS — H40023 Open angle with borderline findings, high risk, bilateral: Secondary | ICD-10-CM | POA: Diagnosis not present

## 2024-07-05 ENCOUNTER — Other Ambulatory Visit: Payer: Self-pay | Admitting: Internal Medicine

## 2024-07-07 ENCOUNTER — Telehealth: Payer: Self-pay | Admitting: Internal Medicine

## 2024-07-07 DIAGNOSIS — E118 Type 2 diabetes mellitus with unspecified complications: Secondary | ICD-10-CM

## 2024-07-07 NOTE — Telephone Encounter (Signed)
 I am unable to send in as well.

## 2024-07-07 NOTE — Telephone Encounter (Signed)
 Copied from CRM #[id number]. Topic: Clinical - Medication Refill >> Jul 07, 2024 10:29 AM [place] J wrote: Medication: OneTouch Delica Lancets 30G MISC   Has the patient contacted their pharmacy? Yes (Agent: If no, request that the patient contact the pharmacy for the refill. If patient does not wish to contact the pharmacy document the reason why and proceed with request.) (Agent: If yes, when and what did the pharmacy advise?) Pharmacy instructed the patient to call primary care.  This is the patient's preferred pharmacy:  [facility] [address] [place] - [address] [place] [address] Phone: [phone] Fax: [phone]  Is this the correct pharmacy for this prescription? Yes If no, delete pharmacy and type the correct one.   Has the prescription been filled recently? No  Is the patient out of the medication? Yes  Has the patient been seen for an appointment in the last year OR does the patient have an upcoming appointment? Yes  Can we respond through MyChart? Yes  Agent: Please be advised that Rx refills may take up to 3 business days. We ask that you follow-up with your pharmacy.

## 2024-07-07 NOTE — Telephone Encounter (Signed)
 Ok to send in generic lancet rx with dispense per patient preference

## 2024-07-08 ENCOUNTER — Other Ambulatory Visit: Payer: Self-pay

## 2024-07-08 MED ORDER — ONETOUCH VERIO VI STRP: ORAL_STRIP | 0 refills | Status: DC

## 2024-07-08 NOTE — Telephone Encounter (Signed)
 This has been sent in

## 2024-07-28 ENCOUNTER — Ambulatory Visit (INDEPENDENT_AMBULATORY_CARE_PROVIDER_SITE_OTHER)

## 2024-07-28 DIAGNOSIS — Z23 Encounter for immunization: Secondary | ICD-10-CM

## 2024-07-28 NOTE — Progress Notes (Signed)
 Pt was given HD flu vaccine w/o any complications.

## 2024-07-29 MED ORDER — ONETOUCH DELICA PLUS LANCET30G MISC: 0 refills | Status: AC

## 2024-07-29 NOTE — Telephone Encounter (Signed)
 Spoke with patient to review needs. Rx for Lancets sent in to preferred Pharmacy.   Patient also made us  aware that her 2026 Benefits change her diabetic testing supplies to Roche Accu-Check or Trividia True.

## 2024-08-27 DIAGNOSIS — Z9622 Myringotomy tube(s) status: Secondary | ICD-10-CM | POA: Diagnosis not present

## 2024-08-27 DIAGNOSIS — H7312 Chronic myringitis, left ear: Secondary | ICD-10-CM | POA: Diagnosis not present

## 2024-08-27 DIAGNOSIS — H9319 Tinnitus, unspecified ear: Secondary | ICD-10-CM | POA: Diagnosis not present

## 2024-10-01 ENCOUNTER — Other Ambulatory Visit: Payer: Self-pay | Admitting: Internal Medicine

## 2024-11-04 ENCOUNTER — Other Ambulatory Visit: Payer: Self-pay | Admitting: Internal Medicine

## 2025-02-26 ENCOUNTER — Ambulatory Visit
